# Patient Record
Sex: Female | Born: 1940 | Race: Black or African American | Hispanic: No | Smoking: Former smoker
Health system: Southern US, Community
[De-identification: ages and names within clinical notes are randomized; demographics above are authoritative.]

## PROBLEM LIST (undated history)

## (undated) DIAGNOSIS — A048 Other specified bacterial intestinal infections: Secondary | ICD-10-CM

## (undated) DIAGNOSIS — R600 Localized edema: Secondary | ICD-10-CM

## (undated) DIAGNOSIS — I10 Essential (primary) hypertension: Secondary | ICD-10-CM

## (undated) DIAGNOSIS — I639 Cerebral infarction, unspecified: Secondary | ICD-10-CM

## (undated) DIAGNOSIS — F0390 Unspecified dementia without behavioral disturbance: Secondary | ICD-10-CM

## (undated) HISTORY — PX: ROTATOR CUFF REPAIR: SHX139

---

## 2005-04-14 ENCOUNTER — Ambulatory Visit: Payer: Self-pay | Admitting: General Practice

## 2006-05-21 ENCOUNTER — Ambulatory Visit: Payer: Self-pay | Admitting: General Practice

## 2007-06-30 ENCOUNTER — Ambulatory Visit: Payer: Self-pay | Admitting: Family Medicine

## 2007-12-23 ENCOUNTER — Inpatient Hospital Stay: Payer: Self-pay | Admitting: Specialist

## 2008-07-06 ENCOUNTER — Ambulatory Visit: Payer: Self-pay | Admitting: Family Medicine

## 2008-12-28 ENCOUNTER — Ambulatory Visit: Payer: Self-pay | Admitting: Family Medicine

## 2009-01-04 ENCOUNTER — Encounter: Payer: Self-pay | Admitting: Family

## 2009-02-01 ENCOUNTER — Encounter: Payer: Self-pay | Admitting: Family

## 2009-03-03 ENCOUNTER — Encounter: Payer: Self-pay | Admitting: Family

## 2009-04-03 ENCOUNTER — Encounter: Payer: Self-pay | Admitting: Family

## 2009-05-03 ENCOUNTER — Encounter: Payer: Self-pay | Admitting: Family

## 2009-05-24 ENCOUNTER — Emergency Department: Payer: Self-pay | Admitting: Emergency Medicine

## 2009-06-07 ENCOUNTER — Ambulatory Visit: Payer: Self-pay | Admitting: Internal Medicine

## 2009-06-14 ENCOUNTER — Ambulatory Visit: Payer: Self-pay | Admitting: Internal Medicine

## 2009-06-18 ENCOUNTER — Ambulatory Visit: Payer: Self-pay | Admitting: Internal Medicine

## 2009-06-21 ENCOUNTER — Ambulatory Visit: Payer: Self-pay | Admitting: Internal Medicine

## 2009-06-28 ENCOUNTER — Ambulatory Visit: Payer: Self-pay | Admitting: Internal Medicine

## 2009-07-06 ENCOUNTER — Ambulatory Visit: Payer: Self-pay | Admitting: Internal Medicine

## 2009-07-13 ENCOUNTER — Ambulatory Visit: Payer: Self-pay | Admitting: Internal Medicine

## 2010-01-02 ENCOUNTER — Inpatient Hospital Stay: Payer: Self-pay | Admitting: Internal Medicine

## 2011-03-17 ENCOUNTER — Encounter: Payer: Self-pay | Admitting: Nurse Practitioner

## 2011-08-24 ENCOUNTER — Inpatient Hospital Stay: Payer: Self-pay | Admitting: Internal Medicine

## 2011-08-25 DIAGNOSIS — R748 Abnormal levels of other serum enzymes: Secondary | ICD-10-CM

## 2011-08-25 DIAGNOSIS — R9431 Abnormal electrocardiogram [ECG] [EKG]: Secondary | ICD-10-CM

## 2012-01-06 ENCOUNTER — Ambulatory Visit: Payer: Self-pay | Admitting: Family Medicine

## 2012-08-11 LAB — URINALYSIS, COMPLETE
Bilirubin,UR: NEGATIVE
Glucose,UR: NEGATIVE mg/dL (ref 0–75)
Nitrite: NEGATIVE
Protein: 30
RBC,UR: 1 /HPF (ref 0–5)
Squamous Epithelial: 2
WBC UR: 7 /HPF (ref 0–5)

## 2012-08-11 LAB — COMPREHENSIVE METABOLIC PANEL
Albumin: 3.9 g/dL (ref 3.4–5.0)
Anion Gap: 7 (ref 7–16)
BUN: 13 mg/dL (ref 7–18)
Calcium, Total: 9.6 mg/dL (ref 8.5–10.1)
Co2: 28 mmol/L (ref 21–32)
EGFR (Non-African Amer.): 51 — ABNORMAL LOW
Glucose: 121 mg/dL — ABNORMAL HIGH (ref 65–99)
Osmolality: 290 (ref 275–301)
Potassium: 3.9 mmol/L (ref 3.5–5.1)
Sodium: 145 mmol/L (ref 136–145)

## 2012-08-11 LAB — CBC
HCT: 37.2 % (ref 35.0–47.0)
MCH: 30 pg (ref 26.0–34.0)
MCV: 87 fL (ref 80–100)
RBC: 4.27 10*6/uL (ref 3.80–5.20)
RDW: 13.8 % (ref 11.5–14.5)
WBC: 8.4 10*3/uL (ref 3.6–11.0)

## 2012-08-12 ENCOUNTER — Observation Stay: Payer: Self-pay | Admitting: Internal Medicine

## 2012-08-12 LAB — CK TOTAL AND CKMB (NOT AT ARMC)
CK, Total: 62 U/L (ref 21–215)
CK-MB: 0.7 ng/mL (ref 0.5–3.6)

## 2012-08-12 LAB — TROPONIN I
Troponin-I: 0.19 ng/mL — ABNORMAL HIGH
Troponin-I: 0.17 ng/mL — ABNORMAL HIGH

## 2012-08-13 DIAGNOSIS — R079 Chest pain, unspecified: Secondary | ICD-10-CM

## 2012-08-13 LAB — URINE CULTURE

## 2012-08-13 LAB — LIPID PANEL
HDL Cholesterol: 45 mg/dL (ref 40–60)
Ldl Cholesterol, Calc: 62 mg/dL (ref 0–100)

## 2013-12-01 LAB — COMPREHENSIVE METABOLIC PANEL
AST: 35 U/L (ref 15–37)
Albumin: 3.8 g/dL (ref 3.4–5.0)
Alkaline Phosphatase: 74 U/L
Anion Gap: 5 — ABNORMAL LOW (ref 7–16)
BUN: 14 mg/dL (ref 7–18)
Bilirubin,Total: 0.7 mg/dL (ref 0.2–1.0)
CALCIUM: 9.5 mg/dL (ref 8.5–10.1)
CHLORIDE: 105 mmol/L (ref 98–107)
CREATININE: 1.27 mg/dL (ref 0.60–1.30)
Co2: 27 mmol/L (ref 21–32)
EGFR (African American): 50 — ABNORMAL LOW
GFR CALC NON AF AMER: 43 — AB
Glucose: 112 mg/dL — ABNORMAL HIGH (ref 65–99)
OSMOLALITY: 275 (ref 275–301)
Potassium: 3.9 mmol/L (ref 3.5–5.1)
SGPT (ALT): 16 U/L (ref 12–78)
Sodium: 137 mmol/L (ref 136–145)
Total Protein: 8.8 g/dL — ABNORMAL HIGH (ref 6.4–8.2)

## 2013-12-01 LAB — URINALYSIS, COMPLETE
BACTERIA: NONE SEEN
Bilirubin,UR: NEGATIVE
Blood: NEGATIVE
GLUCOSE, UR: NEGATIVE mg/dL (ref 0–75)
KETONE: NEGATIVE
LEUKOCYTE ESTERASE: NEGATIVE
Nitrite: NEGATIVE
Ph: 5 (ref 4.5–8.0)
Protein: NEGATIVE
RBC,UR: NONE SEEN /HPF (ref 0–5)
Specific Gravity: 1.012 (ref 1.003–1.030)
Squamous Epithelial: 2
WBC UR: 1 /HPF (ref 0–5)

## 2013-12-01 LAB — CBC
HCT: 37.2 % (ref 35.0–47.0)
HGB: 12.5 g/dL (ref 12.0–16.0)
MCH: 29.7 pg (ref 26.0–34.0)
MCHC: 33.7 g/dL (ref 32.0–36.0)
MCV: 88 fL (ref 80–100)
PLATELETS: 150 10*3/uL (ref 150–440)
RBC: 4.22 10*6/uL (ref 3.80–5.20)
RDW: 13.9 % (ref 11.5–14.5)
WBC: 14.2 10*3/uL — ABNORMAL HIGH (ref 3.6–11.0)

## 2013-12-01 LAB — CK: CK, TOTAL: 114 U/L (ref 21–215)

## 2013-12-01 LAB — RAPID INFLUENZA A&B ANTIGENS (ARMC ONLY)

## 2013-12-02 ENCOUNTER — Inpatient Hospital Stay: Payer: Self-pay | Admitting: Internal Medicine

## 2013-12-03 ENCOUNTER — Ambulatory Visit: Payer: Self-pay | Admitting: Neurology

## 2013-12-05 LAB — PLATELET COUNT: PLATELETS: 187 10*3/uL (ref 150–440)

## 2014-11-17 ENCOUNTER — Emergency Department: Payer: Self-pay | Admitting: Emergency Medicine

## 2015-01-20 ENCOUNTER — Emergency Department: Payer: Self-pay | Admitting: Emergency Medicine

## 2015-02-20 NOTE — H&P (Signed)
PATIENT NAME:  Laura Levy, ASHRAF MR#:  [id number] DATE OF BIRTH:  09-03-1941  DATE OF ADMISSION:  08/12/2012  PRIMARY CARE PHYSICIAN: Dr. Dorothey Baseman    REFERRING PHYSICIAN: Dr. Bayard Males    CHIEF COMPLAINT: Generalized weakness, decreased appetite, unable to walk from weakness and vomiting.   HISTORY OF PRESENT ILLNESS: Laura Levy is a 74 year old African American female with history of systemic hypertension, history of stroke with right hemiparesis. She was in her usual state of health until lately. Her son reports that she is having generalized weakness, unable to walk. She had vomited yesterday. Her appetite is down and she is becoming weak to the extent that she is unable to dress herself. The son also noticed that she has short-term memory and she is forgetting. The Emergency Room physician told me that she has elevated troponin and she has new weakness on the other side of her stroke, however, the patient denies that. Looking at her troponin, troponin is the same as before, even lower than her baseline. For instance, a year ago her troponin was 0.47, 0.5, 0.4, now it is 0.16. The patient denies having any chest pain. No shortness of breath. She had vomited only once.   REVIEW OF SYSTEMS: CONSTITUTIONAL: Denies any fever. No chills but has generalized fatigue. EYES: No blurring of vision. No double vision. ENT: No hearing impairment. No sore throat. No dysphagia. CARDIOVASCULAR: No chest pain. No shortness of breath. No syncope. RESPIRATORY: No cough. No shortness of breath. No chest pain. GASTROINTESTINAL: No abdominal pain. Reported only one episode of vomiting. No diarrhea. GENITOURINARY: No dysuria. No frequency of urination. MUSCULOSKELETAL: No joint pain or swelling. No muscular pain or swelling. INTEGUMENTARY: No skin rash. No ulcers. NEUROLOGY: No focal weakness other than residual right hemiparesis. This is unchanged. There is no new focal weakness more than her baseline other than  generalized weakness and unable to function. No seizure activity. No headache. PSYCHIATRY: No anxiety. No depression. ENDOCRINE: No polyuria or polydipsia. No heat or cold intolerance.   PAST MEDICAL HISTORY:  1. Systemic hypertension.  2. History of stroke with resultant right hemiparesis. Her stroke was in 1988 and she had another one in 2011.  3. Hyperlipidemia. 4. Ex chronic smoker.   PAST SURGICAL HISTORY: Right rotator cuff injury repair.   SOCIAL HABITS: Nonsmoker. She is a former smoker. No history of alcohol or drug abuse.   SOCIAL HISTORY: She is a widow. Lives at home alone. She has a nurse aide who comes to visit her and also her daughter keeps an eye on her.   FAMILY HISTORY: She has no information about her father. She reports that her mother suffered from congestive heart failure before her death.   ADMISSION MEDICATIONS:  1. Aspirin 81 mg a day. 2. Plavix 75 mg a day. 3. Norvasc 10 mg a day.  4. Atorvastatin 80 mg a day, although the son feels probably this is one of the medications that was changed to a different brand. They did not bring her medication with them.   ALLERGIES: No known drug allergies.   PHYSICAL EXAMINATION:   VITAL SIGNS: Blood pressure 151/78, respiratory rate 18, pulse 88, temperature 98.4, pulse oximetry 98%.   GENERAL APPEARANCE: Elderly female laying in bed in no acute distress.   HEAD: No pallor. No icterus. No cyanosis.   EARS, NOSE, AND THROAT: Hearing was normal. Nasal mucosa, lips, tongue were normal.   EYES: Normal eyes and conjunctivae. Pupils about 4 mm, equal, sluggishly  reactive to light.   NECK: Supple. Trachea at midline. No thyromegaly. No cervical lymphadenopathy. No masses.   HEART: Normal S1, S2. No S3, S4. No murmur. No gallop. No carotid bruits.   RESPIRATORY: Normal breathing pattern without use of accessory muscles. No rales. No wheezing.   ABDOMEN: Soft without tenderness. No hepatosplenomegaly. No masses. No  hernias.   SKIN: No ulcers. No subcutaneous nodules.   MUSCULOSKELETAL: No joint swelling. No clubbing.   NEUROLOGIC: Cranial nerves II through XII are intact. There is a subtle right facial weakness barely noticeable. She has right hemiparesis with contracture of her right hand. Plantar responses are downward. She is able to move both lower extremities above the examining bed table level. She has more pronounced weakness in the right arm. These are old findings and nothing new. Speech and swallowing were normal.   PSYCHIATRIC: The patient is alert, oriented to place and people. She thinks it is [id number] but she meant 2013. She knows it is a hospital but did not remember the name of it, however, she knows the name of the town. The date she stated is September but it is October.   LABORATORY, DIAGNOSTIC, AND RADIOLOGICAL DATA: Chest x-ray showed heart size is upper normal. No consolidation. No effusion.   EKG showed normal sinus rhythm at rate of 80 per minute. T wave inversion in leads V1 and V2. This is unchanged compared to her EKG a year ago.   Serum glucose 121, BUN 13, creatinine 1.1, sodium 141, potassium 3.9. Her liver function tests were normal. Troponin was elevated at 0.16, however, her baseline a year ago was 0.47, 0.5, 0.45. CBC showed white count 8000, hemoglobin 12, hematocrit 37, platelet count 162. Urinalysis showed cloudy urine with 7 white blood cells.   ASSESSMENT:  1. Generalized weakness and failure to thrive. 2. Vomiting. This appears to be self-limited.  3. Elevated troponin, however, this is less than her baseline a year ago when she was admitted. At that time she was evaluated by Dr. Mariah MillingGollan and he indicated that she has no symptoms and he felt to continue medical management. Her EKG has subtle abnormalities in T wave, V1 and V2 but this is unchanged compared to her last EKG done a year ago. 4. Urinary tract infection.  5. Systemic hypertension.  6. Old stroke with resultant  right hemiparesis. She also has history of cerebellar infarct as well.  7. Hyperlipidemia.   PLAN:  1. Will admit the patient to the medical floor on telemetry.  2. I will continue follow-up on her troponin, although this could be a baseline for her. I do not know why it is elevated as she is asymptomatic cardiac-wise.  3. Gentle IV hydration.  4. Zofran p.r.n. for nausea and vomiting. I will also add PPI. It may help if she has an element of gastritis.  5. I will send urine for culture and treated her for urinary tract infection with Rocephin, although the urinary tract infection does not seem to be significant.  6. I will consult physical therapy. 7. Regarding the troponin, will check it q.8 hours x3. If there is a trend towards more elevation, then to consider echocardiogram and Cardiology consultation. Again, the level appears below her baseline a year ago.   The patient and her son indicates that she has no LIVING WILL. Her CODE STATUS is FULL CODE.   TIME SPENT: Time Spent evaluating this patient including reviewing her medical records took more than one hour.  ____________________________ Carney Corners. Rudene Re, MD amd:drc D: 08/12/2012 01:15:50 ET T: 08/12/2012 06:17:10 ET JOB#: [id number]  cc: Carney Corners. Rudene Re, MD, <Dictator> Teena Irani. Terance Hart, MD Zollie Scale MD ELECTRONICALLY SIGNED 08/13/2012 22:33

## 2015-02-20 NOTE — Discharge Summary (Signed)
PATIENT NAME:  Laura Levy, Laura M MR#:  [id number] DATE OF BIRTH:  Feb 06, 1941  DATE OF ADMISSION:  08/12/2012 DATE OF DISCHARGE:  08/13/2012  ADMITTING DIAGNOSIS: Generalized weakness and failure to thrive.   DISCHARGE DIAGNOSES: 1. Generalized weakness, dehydration.  2. Episode of nausea, vomiting.  3. Elevated troponin but no acute coronary syndrome or cardiac injury.  4. History of hypertension.  5. Hyperlipidemia with low density lipoprotein of 62. 6. History of transient ischemic attack, cerebrovascular accident with right hemiplegia.   DISCHARGE CONDITION: Stable.   DISCHARGE MEDICATIONS: Patient is to resume her outpatient medications which are:  1. Aspirin 81 mg p.o. daily. 2. Plavix 75 mg p.o. daily.  3. Atorvastatin 80 mg p.o. at bedtime.  4. Amlodipine 10 mg p.o. daily. 5. New medication pantoprazole 40 mg p.o. daily.   NOTE: Home health will be prescribed for her upon discharge. Physical therapy as well as nurse and nurse aide.   HOME OXYGEN: None.   DIET: 2 grams salt, low fat, low cholesterol, regular consistency.   FOLLOW UP: Follow-up appointment with Dr. Terance HartBronstein in two days after discharge.  CONSULTANT: Care management.   HISTORY OF PRESENT ILLNESS: Patient is a 74 year old Caucasian female with past medical history significant for history of stroke who presented to the hospital on 08/12/2012 with generalized weakness, decreased appetite as well as inability to walk, episodes of nausea, vomiting at home. Please refer to Dr. Riley Nearingarwish's admission note on 08/12/2012. On arrival to the Emergency Room patient's blood pressure was 151/78, saturation was 98%, temperature was 98.4, pulse was 88, respiration 18. Physical examination was unremarkable.   LABORATORY, DIAGNOSTIC AND RADIOLOGICAL DATA: Chest x-ray one view 10/09/013 showed no acute cardiopulmonary disease. CT scan of head without contrast 08/11/2012 due to weakness revealed no acute intracranial process. Patient did  have chronic small vessel ischemic disease with multifocal areas of encephalomalacia from prior infarct.   Patient's EKG showed normal sinus rhythm at 80 beats per minute, T wave inversions in leads V1, V2, however, it did not show any change from prior EKG. Patient's lab data on 08/11/2012 showed glucose 121, otherwise BMP was unremarkable. Patient's liver enzymes revealed elevated total protein of 8.7, otherwise unremarkable. Patient's first troponin was mildly elevated at 0.16, second 0.19 and third 0.17, however, patient's MB fraction or CK total were normal. CBC within normal limits. Patient's urinalysis revealed yellow cloudy urine, negative for glucose, bilirubin or ketones, specific gravity 1.024, pH 5.0, negative for blood, 30 mg/dL protein, negative for nitrites, trace leukocyte esterase, 1 red blood cell, 7 white blood cells, trace bacteria, 2 epithelial cells and mucous was present. Patient's urine cultures, however, did not show any growth. Patient's chest x-ray was also unremarkable.   HOSPITAL COURSE: Initially patient was admitted to the hospital because of weakness and that weakness was thought to be possibly due to urinary tract infection, however, as urine culture came back negative her weakness was felt to be due to dehydration and episodes of nausea, vomiting. Actually patient had a few more episodes of nausea and vomiting while in the hospital. She was started on p.o. Protonix and was introduced to clear liquid diet initially which was slowly advanced. With this patient's condition improved. She was able to eat by the day of discharge. She was evaluated by physical therapy who recommended physical therapy at home.   In regards to her cardiac enzymes, it was felt that patient's cardiac enzyme elevation is not acute coronary syndrome or cardiac injury whatsoever. It appears that  patient had mild cardiac enzyme elevation in October 2012 admission. At that time she was evaluated and consulted  by cardiologist, Dr. Mariah Milling. Dr. Mariah Milling, however, did not feel that patient needs any further studies while in the hospital, however, he recommended to follow up with him and consider outpatient LexiScan Myoview if any new symptoms arise. Patient had no chest pains while in the hospital or prior to coming to the hospital. He also recommended to continue statin given patient's recent CVAs. As patient was already on statin during this admission, patient's lipid panel was checked while she was in the hospital and LDL was found to be 62, total cholesterol was found to be 116, triglycerides were 47 and HDL was 45. It was felt that patient's lipid panel was well controlled so no other recommendations were made, however, patient is being discharged to home with home health and recommendation of possibly follow up with Dr. Mariah Milling as outpatient. In the past patient was also evaluated by neurologist, Dr. Sherryll Burger, and recommended to continue the same therapy. We did not repeat patient's echocardiogram as in the past patient's left ventricular function was found to be normal, ejection fraction of 50%, moderate to severe mitral regurgitation as well as mild to moderate tricuspid regurgitation were only noted. A carotid ultrasound was unremarkable in March 2011. Dr. Sherryll Burger at that time did not recommend any anticoagulation, however, recommended to continue aspirin therapy as well as Plavix. Patient's oral intake improved. She was able to tolerate regular diet and it was felt that she was stable to be discharged home. Patient's son, however, had any significant concerns about patient being discharged home, however, he did not verbalize exactly what concerns he had but he was extremely unhappy with patient being discharged to home due to unclear reasons. I tried to talk to patient's son, however, he did not explain his concerns and just droped telephone receiver and was not willing to discuss much more. Nursing staff also discussed with  patient's son his concerns, however, they were just shouted at. It was felt that patient would benefit from home health which will be prescribed for her upon discharge. Patient is being discharged in stable condition with above-mentioned medications and follow up. Her vital signs on day of discharge: Temperature 97.5, pulse 65, respiration rate 18, blood pressure 144/80, saturation 95% to 96% on room air at rest.   TIME SPENT: 40 minutes.   ____________________________ Katharina Caper, MD rv:cms D: 08/16/2012 14:19:14 ET T: 08/17/2012 09:29:00 ET JOB#: [id number]  cc: Katharina Caper, MD, <Dictator> Teena Irani. Terance Hart, MD  Katharina Caper MD ELECTRONICALLY SIGNED 09/04/2012 7:58

## 2015-02-24 NOTE — Discharge Summary (Signed)
PATIENT NAME:  Laura Levy, Laura Levy MR#:  [id number] DATE OF BIRTH:  1941-04-07  DATE OF ADMISSION:  12/02/2013 DATE OF DISCHARGE:  12/05/2013  ADMITTING PHYSICIAN: Fritzi Mandes, MD  DISCHARGING PHYSICIAN: Gladstone Lighter, MD  PRIMARY CARE PHYSICIAN: Juluis Pitch, MD  [place]:  1.  Neurology with Dr. Gurney Maxin.  2.  Orthopedic with Dr. Marry Guan.   DISCHARGE DIAGNOSES: 1.  Acute on chronic right-sided weakness with MRI showing an acute infarct in the right external capsule.  2.  History of cerebrovascular accident with prior right-sided weakness.  3.  Hypertension.  4.  Hyperlipidemia.   DISCHARGE HOME MEDICATIONS:  1.  Aspirin 81 mg p.o. daily.  2.  Plavix 75 mg p.o. daily.  3.  Atorvastatin 80 mg p.o. at bedtime.  4.  Amlodipine 10 mg p.o. daily.  5.  MiraLax p.r.n. for constipation.  6.  Senokot 1 tablet p.o. b.i.d. p.r.n. for constipation.   DISCHARGE DIET: Low-sodium.  DISCHARGE ACTIVITY: As tolerated.   FOLLOWUP INSTRUCTIONS: 1.  Physical therapy.  2.  PCP followup in 1 to 2 weeks.   LABS AND IMAGING STUDIES: Prior to discharge: Urinalysis negative for any infection.   WBC 14.2, hemoglobin 12.5, hematocrit 37.2, platelet count 115.   Sodium 137, potassium 3.9, chloride 105, bicarb 27, BUN 14, creatinine 1.27, glucose 112, and calcium of 9.5.   ALT 16, AST 35, alk phos 94, total bili 0.7, and albumin of 3.8.   Right hip x-ray showing normal exam. No dislocation or fracture.   Ultrasound of lower extremity, of the right leg, showing no evidence of any DVT.   Chest x-ray showing bilateral pulmonary hypoinflation. No evidence of pneumonia or edema.   Influenza test is negative.   CT of the head without contrast showing atrophy and extensive chronic ischemic changes. No acute infarct noted.   MRI of the brain without contrast showing a small area of acute infarct in the right superior external capsule. Advanced chronic ischemic changes noted  and also atrophy.   BRIEF HOSPITAL COURSE: Laura Levy is a 74 year old African American female with past medical history significant for history of CVA with residual right-sided weakness, hypertension, and hyperlipidemia who presents to the hospital from home secondary to right-sided leg pain and also difficulty walking.  1.  Acute on chronic right-sided weakness. The patient with known history of CVA and right-sided weakness who was ambulating at home with the help of a walker who presented to the hospital secondary to significant pain along the leg and unable to even ambulate. She was initially admitted under observation. The patient worked with physical therapy who recommended that she might benefit from going to rehab. No other focal neurological deficits were noted. She was evaluated by neurology who recommended a MRI. MRI did show another acute infarct in the right-sided external capsule posteriorly. However, not sure if that has to do with any contribution to her right-sided weakness. Her weakness has improved and she is back to her baseline. She has 4/5 strength in her right lower extremity and 3 to 4/5 strength in her right upper extremity with loss of right hand grip. She is already on dual antiplatelet treatment with aspirin and Plavix, and she is also on statin which will be continued at this time. 2.  Hypertension. The patient is on Norvasc, which will be continued.  3.  Constipation. She was extensively constipated in the hospital requiring Senokot, MiraLax and p.r.n. enema.  Her course has been otherwise uneventful in  the hospital.   DISCHARGE CONDITION: Stable.   DISCHARGE DISPOSITION: Short-term rehab.   TIME SPENT ON DISCHARGE: 40 minutes.  ____________________________ Gladstone Lighter, MD rk:sb D: 12/05/2013 12:27:18 ET T: 12/05/2013 13:14:19 ET JOB#: [id number]  cc: Gladstone Lighter, MD, <Dictator> Youlanda Roys. Lovie Macadamia, MD Gladstone Lighter MD ELECTRONICALLY SIGNED 12/08/2013  14:54

## 2015-02-24 NOTE — Consult Note (Signed)
Referring Physician:  Ilda Basset :   Primary Care Physician:  Woodfin Ganja : [facility], [address] [place], [place] [address], [phone]  Reason for Consult: Admit Date: 01-Dec-2013  Chief Complaint: weakness  Reason for Consult: confusion; weakness   History of Present Illness: History of Present Illness:   74 year old woman with a history of stroke and related symptoms of RUE moreso than RLE weakness.  Normally she can walk with a cane.  Her sister in the room says yesterday patient was very weak.  Having trouble walking.  She had complained of some right hip pain.  No falls or recent injuries that sister can recall.  In addition, patient has seemed confused to her sister.  She says patient is normally fully oriented, but today has had some confusion, not able to answer questions quickly and accurately like normal.  Patinet was seen in the ED and found to have normal vitals.  Also with chronic dysphagia as a result of prior stroke.  Symptoms persistent.  Moderate to severe per sister.   stroke.  History:  tobacco or alcohol. History:  FH of stroke or other known neurologic disease. Lipitor, ASA 81, Amlodipine       ROS:  General weakness   HEENT no complaints   Lungs no complaints   Cardiac no complaints   GI no complaints   GU no complaints   Musculoskeletal no complaints   Extremities no complaints   Skin no complaints   Neuro confusion   Endocrine no complaints   Psych no complaints   Past Medical/Surgical Hx:  dysphagia: h/o a dysphagia diet per pt.  CVA:   Hypercholesterolemia:   HTN:   none:   Home Medications: Medication Instructions Last Modified Date/Time  amlodipine 10 mg oral tablet 1 tab(s) orally once a day x 30 days 29-Jan-15 05:35  aspirin 81 mg oral tablet 1  orally once a day  29-Jan-15 05:35  Plavix 75 mg oral tablet 1  orally once a day  29-Jan-15 05:35  atorvastatin 80 mg oral tablet 1 tab(s) orally once a day (at  bedtime) 29-Jan-15 05:35   [facility] Current Meds:  HePARin injection, 5000 unit(s), Subcutaneous, q8h  Indication: Anticoagulant, Monitor Anticoags per hospital protocol  Sodium Chloride 0.9% injection, 3 ml, IV push, q6h  Acetaminophen * tablet, ( Tylenol (325 mg) tablet)  650 mg Oral q4h PRN for pain or temp. greater than 100.4  - Indication: Pain/Fever  Docusate Sodium capsule, ( Colace)  100 mg Oral bid PRN for constipation  - Indication: Stool Softener  Senna tablet, ( Senokot)  1 tablet(s) Oral bid PRN for constipation  - Indication: Stool Softner/ Constipation/ Bowel Prep for Surgery  Instructions:  1 tablet = 8.6 mg  MorphINE  injection, 1 to 2 mg, IV push, q6h PRN for pain  Indication: Pain, [Med Admin Window: 30 mins before or after scheduled dose]  Ondansetron injection, ( Zofran injection )  4 mg, IV push, q4h PRN for Nausea/Vomiting  Indication: Nausea/ Vomiting  amLODIPine tablet, ( Norvasc)  10 mg Oral daily  - Indication: Hypertension/ Angina  Aspirin Chewable, 81 mg Oral daily  - Indication: Pain/Fever/Thromboembolic Disorders/Post MI/Prophylaxis MI  atorvaSTATin tablet, 80 mg Oral at bedtime  - Indication: Hypercholesterolemia  Clopidogrel tablet, 75 mg Oral daily  Instructions:  Initiate Bleeding Precautions Protocol  Nursing Saline Flush, 3 to 6 ml, IV push, Q1M PRN for IV Maintenance  Pneumococcal 23-valent Vaccine, 0.5 ml, Intramuscular,  once  Indication: Pneumococcal Immunization, 0.45m IM once (Stored in Pyxis Refrigerator)  Influenza Virus Quadrivalent Vaccine injection, 0.5 ml, Intramuscular, GivenOnce  Indication: provide Active Immunity to Influenza Strains contained in Vaccine, ***The patient must have a temperature of 100.5 or less, anything greater the patient needs to be afebrile x 24 hours before administration***, **Latex Free**  Allergies:  No Known Allergies:   Vital Signs: **Vital Signs.:   30-Jan-15 15:20  Vital Signs Type Routine   Temperature Temperature (F) 98.2  Celsius 36.7  Temperature Source oral  Pulse Pulse 88  Respirations Respirations 18  Systolic BP Systolic BP 1099 Diastolic BP (mmHg) Diastolic BP (mmHg) 66  Mean BP 80  Pulse Ox % Pulse Ox % 92  Pulse Ox Activity Level  At rest  Oxygen Delivery Room Air/ 21 %    21:23  Temperature Source oral   EXAM: GENERAL: Pleasant and in NAD.  Confused.  Slow to follow commands.  Normocephalic and atraumatic.  EYES: Funduscopic exam shows normal disc size, appearance and C/D ratio without clear evidence of papilledema.  CARDIOVASCULAR: S1 and S2 sounds are within normal limits, without murmurs, gallops, or rubs.  MUSCULOSKELETAL: Bulk - Normal Tone - Normal Pronator Drift - Absent bilaterally. Ambulation - Unable to ambulate without assist, so deferred ambulation testing today.  R/L 4/5    Shoulder abduction (deltoid/supraspinatus, axillary/suprascapular n, C5) 4/5    Elbow flexion (biceps brachii, musculoskeletal n, C5-6) 4/5    Elbow extension (triceps, radial n, C7) 4/5    Finger adduction (interossei, ulnar n, T1)   4+/5    Hip flexion (iliopsoas, L1/L2) 4+/5    Knee flexion (hamstrings, sciatic n, L5/S1) 4+/5    Knee extension (quadriceps, femoral n, L3/4) 4+/5    Ankle dorsiflexion (tibialis anterior, deep fibular n, L4/5) 4+/5    Ankle plantarflexion (gastroc, tibial n, S1)  NEUROLOGICAL: MENTAL STATUS: Patient is oriented x 2.  Recent and remote memory are moderately impaired.  Attention span and concentration is reduced.  Naming, repetition, comprehension and expressive speech are within normal limits.  Patient's fund of knowledge is reduced.  CRANIAL NERVES: Normal    CN II (normal visual acuity and visual fields) Normal    CN III, IV, VI (extraocular muscles are intact) Normal    CN V (facial sensation is intact bilaterally) Normal    CN VII (facial strength is intact bilaterally) Normal    CN VIII (hearing is intact  bilaterally) Normal    CN IX/X (palate elevates midline, normal phonation) Normal    CN XI (shoulder shrug strength is normal and symmetric) Normal    CN XII (tongue protrudes midline)   SENSATION: Patchy sensory loss is noted in the RUE and RLEs.  Otherwise sensation is wnl.   REFLEXES: R/L 2+/2+    Biceps 2+/2+    Brachioradialis   2+/2+    Patellar 2+/2+    Achilles   COORDINATION/CEREBELLAR: Finger to nose testing is within normal limits in the LUE.  Lab Results: LabObservation:  29-Jan-15 07:19   OBSERVATION Reason for Test Pain, eval for DVT  Hepatic:  29-Jan-15 05:22   Bilirubin, Total 0.7  Alkaline Phosphatase 74 (45-117 NOTE: New Reference Range 09/23/13)  SGPT (ALT) 16  SGOT (AST) 35  Total Protein, Serum  8.8  Albumin, Serum 3.8  Routine Micro:  29-Jan-15 08:10   Micro Text Report INFLUENZA A+B ANTIGENS   COMMENT  NEGATIVE FOR INFLUENZA A (ANTIGEN ABSENT)   COMMENT                   NEGATIVE FOR INFLUENZA B (ANTIGEN ABSENT)   ANTIBIOTIC                       Comment 1.. NEGATIVE FOR INFLUENZA A (ANTIGEN ABSENT) A negative result does not exclude influenza. Correlation with clinical impression is required.  Comment 2.. NEGATIVE FOR INFLUENZA B (ANTIGEN ABSENT)  Result(s) reported on 01 Dec 2013 at 09:00AM.  Routine Chem:  29-Jan-15 05:22   Glucose, Serum  112  BUN 14  Creatinine (comp) 1.27  Sodium, Serum 137  Potassium, Serum 3.9  Chloride, Serum 105  CO2, Serum 27  Calcium (Total), Serum 9.5  Osmolality (calc) 275  eGFR (African American)  50  eGFR (Non-African American)  43 (eGFR values <36m/min/1.73 m2 may be an indication of chronic kidney disease (CKD). Calculated eGFR is useful in patients with stable renal function. The eGFR calculation will not be reliable in acutely ill patients when serum creatinine is changing rapidly. It is not useful in  patients on dialysis. The eGFR calculation may not be applicable to patients  at the low and high extremes of body sizes, pregnant women, and vegetarians.)  Anion Gap  5  Cardiac:  29-Jan-15 05:22   CK, Total 114 (Result(s) reported on 01 Dec 2013 at 07:02AM.)  Routine UA:  29-Jan-15 05:22   Color (UA) Yellow  Clarity (UA) Clear  Glucose (UA) Negative  Bilirubin (UA) Negative  Ketones (UA) Negative  Specific Gravity (UA) 1.012  Blood (UA) Negative  pH (UA) 5.0  Protein (UA) Negative  Nitrite (UA) Negative  Leukocyte Esterase (UA) Negative (Result(s) reported on 01 Dec 2013 at 05:45AM.)  RBC (UA) NONE SEEN  WBC (UA) 1 /HPF  Bacteria (UA) NONE SEEN  Epithelial Cells (UA) 2 /HPF (Result(s) reported on 01 Dec 2013 at 05:45AM.)  Routine Hem:  29-Jan-15 05:22   WBC (CBC)  14.2  RBC (CBC) 4.22  Hemoglobin (CBC) 12.5  Hematocrit (CBC) 37.2  Platelet Count (CBC) 150 (Result(s) reported on 01 Dec 2013 at 05:41AM.)  MCV 88  MCH 29.7  MCHC 33.7  RDW 13.9   Radiology Results: CT:    30-Jan-15 14:37, CT Head Without Contrast  CT Head Without Contrast   REASON FOR EXAM:    increasing weakness right LE  COMMENTS:       PROCEDURE: CT  - CT HEAD WITHOUT CONTRAST  - Dec 02 2013  2:37PM     CLINICAL DATA:  Increasing weakness right leg    EXAM:  CT HEAD WITHOUT CONTRAST    TECHNIQUE:  Contiguous axial images were obtained from the base of the skull  through the vertex without intravenous contrast.    COMPARISON:  MRI 08/25/2011  FINDINGS:  Generalized atrophy. Extensive chronic ischemic change. Chronic  infarcts in the frontal lobes bilaterally. Chronic infarct in the  left caudate. Chronic infarcts in the cerebellum bilaterally.  Chronic right occipital infarct.    Negative for acute infarct. Negative for hemorrhage or mass. No  shift of the midline structures.    Mucosal thickening in the sphenoid sinus.     IMPRESSION:  Atrophy and extensive chronic ischemic change. No acute infarct. MRI  is more sensitive for acute infarct than  CT.  Electronically Signed    By: CFranchot GalloM.D.    On: 12/02/2013 14:52  Verified By: Truett Perna, M.D.,   Impression/Recommendations: Recommendations:   74 year old woman with a history of stroke and related symptoms of RUE moreso than RLE weakness. with confusion and abrupt onset difficulty with ambulation and subjective weakness.  Given the chronic RUE and RLE weakness, it is uncertain whether or not there is new weakness.  No clear apraxia on exam.  History of multiple infarcts in the past judging from her HCT.  Concern for acute CVA given abrupt onset nature of difficulty walking without musculoskeletal injury.  Recommend Brain MRI without contrast to eval for acute stroke.  Patient appears confused today and sister agrees with this assessment.  Is not febrile.  Labs unremarkable.  Would check B12, TSH and ammonia levels.  UA is unremarkable.  Rec she work with physical therapy while inpatient.   have reviewed the results of the most recent imaging studies, tests and labs as outlined above and answered all related questions.  have personally viewed the patient's HCT and it shows multiple, bilateral, old infarcts. and coordinated plan of care with hospitalist.   Electronic Signatures: Anabel Bene (MD)  (Signed 31-Jan-15 00:00)  Authored: REFERRING PHYSICIAN, Primary Care Physician, Consult, History of Present Illness, Review of Systems, PAST MEDICAL/SURGICAL HISTORY, HOME MEDICATIONS, Current Medications, ALLERGIES, NURSING VITAL SIGNS, Physical Exam-, LAB RESULTS, RADIOLOGY RESULTS, Recommendations   Last Updated: 31-Jan-15 00:00 by Anabel Bene (MD)

## 2015-02-24 NOTE — H&P (Signed)
PATIENT NAME:  Laura Levy, Laura Levy MR#:  [id number] DATE OF BIRTH:  07-19-1941  DATE OF ADMISSION:  12/01/2013  PRIMARY CARE PHYSICIAN: Dr. Terance Hart.   CHIEF COMPLAINT: Difficulty walking and feeling weak all over, unable to get around at home.   HISTORY OF PRESENT ILLNESS: Laura Levy is a pleasant 74 year old African American female with history of CVA, which has left her with symptoms of mild dysplasia and right upper and lower extremity weakness. She lives at home with her daughter and walks around using a cane. This morning she tried to wake up and tried to get out of bed and had difficulty getting her out of bed and felt  very weak. She denies any fall. She does have some right hip pain, which is chronic due to n EGD and denies any recent injury. In the Emergency Room, the patient was found to be hemodynamically stable. She was almost a 2 person assist in the ER and was not able to sit at the edge of the bed for very long. The patient is being admitted for further evaluation and management secondary her ambulation dysfunction from her late side effects of CVA in the past.   PAST MEDICAL HISTORY:  1.  Dysphagia.  2.  CVA.  3.  Hypercholesterolemia.  4.  Hypertension.    SOCIAL HISTORY: Lives at home with her daughter. She does have an aide that comes during the daytime to help for  a few hours. She is a nonsmoker, nonalcoholic.   MEDICATIONS:  1.  Plavix 75 mg daily.  2.  Atorvastatin 80 mg daily.  3.  Aspirin 81 mg daily.  4.  Amlodipine 10 mg daily.   ALLERGIES: No known drug allergies.   FAMILY HISTORY: She reports that her mother suffered from congestive heart failure.   REVIEW OF SYSTEMS: CONSTITUTIONAL: No fever. Positive for fatigue and weakness. EYES: No blurred or double vision. ENT: No tinnitus, hearing loss or ear pain. RESPIRATORY: No cough, wheeze, hemoptysis, or COPD. CARDIOVASCULAR: No chest pain, orthopnea or arrhythmia. Positive for hypertension. GASTROINTESTINAL: No nausea,  vomiting, diarrhea, abdominal pain. GENITOURINARY: No dysuria, hematuria, or frequency. ENDOCRINE: No polyuria, nocturia or thyroid problems. HEMATOLOGY: No anemia or easy bruising. SKIN: No acne or rash. MUSCULOSKELETAL: Positive for arthritis and possible right hip pain. No joint swelling. NEUROLOGIC: Positive for CVA, late side effects with right upper and lower extremity weakness. Mild dysarthria. PSYCHIATRIC: No anxiety or depression. All other systems reviewed are negative.   PHYSICAL EXAMINATION:  GENERAL: The patient is awake, alert, oriented x 3, not in acute distress.  VITAL SIGNS: She is afebrile, pulse is 106 blood pressure is 140/66 and sats are 99% on room air.  HEENT: Atraumatic, normocephalic. Pupils: PERRLA. EOM intact. Oral mucosa is moist.  NECK: Supple. No JVD. No carotid bruit.  LUNGS: Clear to auscultation bilaterally. No rales, rhonchi, respiratory distress or labored breathing.  CARDIOVASCULAR: Both the heart sounds are normal. Rate and rhythm is regular. PMI not lateralized. Chest is nontender.  EXTREMITIES: Good pedal pulses and good femoral pulses. No lower extremity edema.  ABDOMEN: Soft, benign, nontender. No organomegaly.  SKIN: Warm and dry.  NEUROLOGIC: Grossly intact, cranial nerves II through XII. Mild dysarthria. No facial droop. The patient has chronic left upper and lower extremity weakness/hemiparesis and some minimal contracture. Reflexes are 1+ in both upper and lower extremities. Sensory exam is within normal limits. Gait not tested.   Influenza A and B negative. Chest x-ray: Bilateral hypoinflation. No evidence of alveolar  pneumonia. Ultrasound lower extremity no evidence of DVT. Right hip shows no evidence of fracture or dislocation. UA negative for UTI. White count is 14.2, hemoglobin and hematocrit is 12.5 and 37.2. Comprehensive metabolic panel is within normal limits. CK total is 114.   ASSESSMENT AND PLAN: A 74 year old, Laura Levy, with history of CVA  in the past with right-sided hemiparesis and mild dysarthria and history of hypertension, who comes in with ambulation dysfunction this morning. She feels a little weaker than her usual self. She is being admitted with:  1.  Ambulation dysfunction with right hip pain. 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 Clinically, I do not think se is having any stroke. She is neurologically intact other than the late side effects of her CVA, which right-sided hemiparesis. The patient will be admitted on the medical floor. We will have physical therapy see patient to determine a safe discharge plan for the patient. Will have care management/social worker also see the patient.  2.  Hypertension. Continue amlodipine.  3.  History of CVA with hemiparesis. Continue aspirin and Plavix.  4.  Hyperlipidemia, on atorvastatin.  5.  Deep vein thrombosis prophylaxis with subcutaneous heparin.  6.  Further workup according to the patient's clinical course. Hospital admission plan was discussed with the patient and the patient's sister, who was present in the Emergency Room.   TIME SPENT: 40 minutes.   ____________________________ Wylie HailSona A. Allena KatzPatel, MD sap:aw D: 12/01/2013 11:36:46 ET T: 12/01/2013 11:50:32 ET JOB#: [id number]  cc: Allysia Ingles A. Allena KatzPatel, MD, <Dictator> Teena Iraniavid M. Terance HartBronstein, MD Willow OraSONA A Rae Sutcliffe MD ELECTRONICALLY SIGNED 12/01/2013 16:08

## 2015-02-24 NOTE — Consult Note (Signed)
Brief Consult Note: Diagnosis: Difficulty standing/walking; right hip pain resolved.   Patient was seen by consultant.   Consult note dictated.   Comments: No focal orthopaedic deficit to explain the difficulty with standing/ambulation. Consider Neurology consult.  Electronic Signatures: Donato HeinzHooten, James P (MD)  (Signed 30-Jan-15 14:51)  Authored: Brief Consult Note   Last Updated: 30-Jan-15 14:51 by Donato HeinzHooten, James P (MD)

## 2015-02-24 NOTE — Consult Note (Signed)
PATIENT NAME:  Laura Levy, WALDER MR#:  [id number] DATE OF BIRTH:  1941/03/09  DATE OF CONSULTATION:  12/02/2013  REFERRING PHYSICIAN:  Enedina Finner, MD CONSULTING PHYSICIAN:  Illene Labrador. Angie Fava., MD  CHIEF COMPLAINT: Right hip pain and difficulty walking.   HISTORY OF PRESENT ILLNESS: The patient is a 74 year old female who has a previous history of CVA who has been having some progressive short-term memory issues. On the day of admission she attempted to get out of bed and reported having significant difficulty due to "feeling very weak." There was no fall or trauma. She apparently presented to [facility] Emergency Room complaining of some right hip pain, although with further questioning she has had some chronic intermittent hip pain. Attempt at PT evaluation demonstrated need for 2 person assist with poor progression with gait.   PAST MEDICAL HISTORY: Dysphagia, CVA with residual right-sided weakness, upper extremity greater than lower extremity, hypercholesterolemia, and hypertension.   ADMISSION MEDICATIONS: Plavix 75 mg daily, atorvastatin 80 mg daily, aspirin 81 mg daily, and amlodipine 10 mg daily.   ALLERGIES: No known drug allergies.   SOCIAL HISTORY: The patient lives at home with her daughter. She denies any current tobacco or alcohol use.   FAMILY HISTORY: Congestive heart failure in her mother.   REVIEW OF SYSTEMS: The patient denies any fevers or chills. She does report generalized fatigue and weakness, which is not localized to the upper or lower extremities. Pertinent musculoskeletal exam is positive for some mild generalized arthritis. She denies any joint swelling. She denies any locking or giving way of the lower extremities.   PHYSICAL EXAMINATION: GENERAL: The patient is a pleasant well-developed, well-nourished female seen in no acute distress.  HEENT: Atraumatic, normocephalic. Sclerae clear. Extraocular motion is intact. Oropharynx is clear. Tongue is  midline.  NECK: Supple, nontender, with good range of motion.  EXTREMITIES: There is posturing noted to the right upper extremity consistent with the patient's previous history of stroke. Good range of motion of the left upper extremity.  LOWER EXTREMITIES: No gross tenderness to palpation about the greater trochanters, knees, or ankles. Some mild generalized muscle atrophy is noted. Good range of motion of the hips and knees. The patient is able to perform independent straight leg raise. No pain is elicited with range of motion of either hip. Knees are stable to varus and valgus stress. No gross effusion is appreciated. Flexion of greater than 100 degrees is noted bilaterally. Lachman test is negative bilaterally. ANKLES: Demonstrates good ankle dorsiflexion and plantar flexion. No gross instability. No gross pretibial or ankle edema.  NEUROLOGIC: The patient is awake, alert, and oriented. She is answering questions appropriately. However, she seems to have difficulty processing demands, especially with regards to range of motion or strength testing in the lower extremities.   Sensory function is intact to light touch in both upper and lower extremities. Motor strength is felt to be 5/5 in the hip flexors and abductors, knee extensors, ankle dorsiflexion, and plantar flexion. No clonus or tremor. Reasonably good motor coordination is noted with the exception of the right upper extremity.   X-RAYS: Radiographs of the right hip from [facility] dated 12/01/2013 were reviewed. No significant degenerative changes appreciated to either hip. Good preservation of the cartilage space. No significant soft tissue calcification. No radiographic evidence of avascular necrosis. No evidence of fracture or dislocation.   IMPRESSION:  1.  Subjective lower extremity weakness and difficulty walking.  2.  Right hip pain, resolved.  PLAN: Findings were discussed with the patient and her family. I also discussed the findings  with Dr. Allena KatzPatel. I do not see any significant joint issues. She also demonstrates good strength with manual strength testing. Given her history of cerebrovascular accident and the family's description of progressive difficulty with cognitive measures, I believe it would be worthwhile to consider neurology consult.  ____________________________ Illene LabradorJames P. Angie FavaHooten Jr., MD jph:sb D: 12/02/2013 14:49:44 ET T: 12/02/2013 15:41:12 ET JOB#: [id number]  cc: Fayrene FearingJames P. Angie FavaHooten Jr., MD, <Dictator> Illene LabradorJAMES P Angie FavaHOOTEN JR MD ELECTRONICALLY SIGNED 12/23/2013 6:39

## 2015-03-20 ENCOUNTER — Encounter
Admission: RE | Admit: 2015-03-20 | Discharge: 2015-03-20 | Disposition: A | Discharge status: Home / Self Care | Payer: Medicare Other | Source: Ambulatory Visit | Attending: Ophthalmology | Admitting: Ophthalmology

## 2015-03-20 DIAGNOSIS — I10 Essential (primary) hypertension: Secondary | ICD-10-CM | POA: Diagnosis not present

## 2015-03-20 DIAGNOSIS — H2589 Other age-related cataract: Secondary | ICD-10-CM | POA: Insufficient documentation

## 2015-03-20 DIAGNOSIS — Z01810 Encounter for preprocedural cardiovascular examination: Secondary | ICD-10-CM | POA: Insufficient documentation

## 2015-03-21 ENCOUNTER — Encounter: Payer: Self-pay | Admitting: *Deleted

## 2015-03-21 DIAGNOSIS — H2511 Age-related nuclear cataract, right eye: Secondary | ICD-10-CM | POA: Diagnosis not present

## 2015-03-21 DIAGNOSIS — E780 Pure hypercholesterolemia: Secondary | ICD-10-CM | POA: Diagnosis not present

## 2015-03-21 DIAGNOSIS — F0390 Unspecified dementia without behavioral disturbance: Secondary | ICD-10-CM | POA: Diagnosis not present

## 2015-03-21 DIAGNOSIS — Z87891 Personal history of nicotine dependence: Secondary | ICD-10-CM | POA: Diagnosis not present

## 2015-03-21 DIAGNOSIS — Z7982 Long term (current) use of aspirin: Secondary | ICD-10-CM | POA: Diagnosis not present

## 2015-03-21 DIAGNOSIS — Z8673 Personal history of transient ischemic attack (TIA), and cerebral infarction without residual deficits: Secondary | ICD-10-CM | POA: Diagnosis not present

## 2015-03-21 DIAGNOSIS — Z79899 Other long term (current) drug therapy: Secondary | ICD-10-CM | POA: Diagnosis not present

## 2015-03-21 DIAGNOSIS — I10 Essential (primary) hypertension: Secondary | ICD-10-CM | POA: Diagnosis not present

## 2015-03-27 ENCOUNTER — Encounter: Payer: Self-pay | Admitting: *Deleted

## 2015-03-27 ENCOUNTER — Ambulatory Visit
Admission: RE | Admit: 2015-03-27 | Discharge: 2015-03-27 | Disposition: A | Discharge status: Home / Self Care | Payer: Medicare Other | Source: Ambulatory Visit | Attending: Ophthalmology | Admitting: Ophthalmology

## 2015-03-27 ENCOUNTER — Encounter
Admission: RE | Disposition: A | Discharge status: Home / Self Care | Payer: Self-pay | Source: Ambulatory Visit | Attending: Ophthalmology

## 2015-03-27 ENCOUNTER — Ambulatory Visit: Payer: Medicare Other | Admitting: Anesthesiology

## 2015-03-27 DIAGNOSIS — H2511 Age-related nuclear cataract, right eye: Secondary | ICD-10-CM | POA: Diagnosis not present

## 2015-03-27 DIAGNOSIS — I10 Essential (primary) hypertension: Secondary | ICD-10-CM | POA: Insufficient documentation

## 2015-03-27 DIAGNOSIS — F0390 Unspecified dementia without behavioral disturbance: Secondary | ICD-10-CM | POA: Insufficient documentation

## 2015-03-27 DIAGNOSIS — Z79899 Other long term (current) drug therapy: Secondary | ICD-10-CM | POA: Insufficient documentation

## 2015-03-27 DIAGNOSIS — Z8673 Personal history of transient ischemic attack (TIA), and cerebral infarction without residual deficits: Secondary | ICD-10-CM | POA: Insufficient documentation

## 2015-03-27 DIAGNOSIS — Z7982 Long term (current) use of aspirin: Secondary | ICD-10-CM | POA: Insufficient documentation

## 2015-03-27 DIAGNOSIS — E780 Pure hypercholesterolemia: Secondary | ICD-10-CM | POA: Insufficient documentation

## 2015-03-27 DIAGNOSIS — Z87891 Personal history of nicotine dependence: Secondary | ICD-10-CM | POA: Insufficient documentation

## 2015-03-27 HISTORY — DX: Unspecified dementia, unspecified severity, without behavioral disturbance, psychotic disturbance, mood disturbance, and anxiety: F03.90

## 2015-03-27 HISTORY — DX: Localized edema: R60.0

## 2015-03-27 HISTORY — DX: Essential (primary) hypertension: I10

## 2015-03-27 HISTORY — PX: CATARACT EXTRACTION W/PHACO: SHX586

## 2015-03-27 HISTORY — DX: Cerebral infarction, unspecified: I63.9

## 2015-03-27 SURGERY — PHACOEMULSIFICATION, CATARACT, WITH IOL INSERTION
Anesthesia: Monitor Anesthesia Care | Site: Eye | Laterality: Right | Wound class: Clean

## 2015-03-27 MED ORDER — TRYPAN BLUE 0.06 % OP SOLN
OPHTHALMIC | Status: AC
  Filled 2015-03-27: qty 0.5

## 2015-03-27 MED ORDER — POVIDONE-IODINE 5 % OP SOLN: 1.0000 "application " | OPHTHALMIC | Status: DC | PRN

## 2015-03-27 MED ORDER — ARMC OPHTHALMIC DILATING GEL
OPHTHALMIC | Status: AC
  Administered 2015-03-27: 1 via OPHTHALMIC
  Filled 2015-03-27: qty 0.25

## 2015-03-27 MED ORDER — TRYPAN BLUE 0.06 % OP SOLN
OPHTHALMIC | Status: DC | PRN
  Administered 2015-03-27: 0.5 mL via INTRAOCULAR

## 2015-03-27 MED ORDER — MOXIFLOXACIN HCL 0.5 % OP SOLN
OPHTHALMIC | Status: AC
  Filled 2015-03-27: qty 3

## 2015-03-27 MED ORDER — EPINEPHRINE HCL 1 MG/ML IJ SOLN
INTRAMUSCULAR | Status: AC
  Filled 2015-03-27: qty 1

## 2015-03-27 MED ORDER — ARMC OPHTHALMIC DILATING GEL
1.0000 "application " | OPHTHALMIC | Status: DC | PRN
  Administered 2015-03-27: 1 via OPHTHALMIC

## 2015-03-27 MED ORDER — LIDOCAINE HCL (PF) 4 % IJ SOLN
INTRAMUSCULAR | Status: AC
  Filled 2015-03-27: qty 5

## 2015-03-27 MED ORDER — LIDOCAINE HCL (PF) 1 % IJ SOLN
INTRAOCULAR | Status: DC | PRN
  Administered 2015-03-27: 09:00:00 via OPHTHALMIC

## 2015-03-27 MED ORDER — CEFUROXIME OPHTHALMIC INJECTION 1 MG/0.1 ML
INJECTION | OPHTHALMIC | Status: AC
  Filled 2015-03-27: qty 0.1

## 2015-03-27 MED ORDER — TETRACAINE HCL 0.5 % OP SOLN
OPHTHALMIC | Status: AC
  Filled 2015-03-27: qty 2

## 2015-03-27 MED ORDER — NA CHONDROIT SULF-NA HYALURON 40-17 MG/ML IO SOLN
INTRAOCULAR | Status: AC
  Filled 2015-03-27: qty 1

## 2015-03-27 MED ORDER — EPINEPHRINE HCL 1 MG/ML IJ SOLN
INTRAOCULAR | Status: DC | PRN
  Administered 2015-03-27: 200 mL

## 2015-03-27 MED ORDER — POVIDONE-IODINE 5 % OP SOLN
OPHTHALMIC | Status: AC
  Filled 2015-03-27: qty 30

## 2015-03-27 MED ORDER — MOXIFLOXACIN HCL 0.5 % OP SOLN - NO CHARGE
OPHTHALMIC | Status: DC | PRN
  Administered 2015-03-27: 1 [drp] via OPHTHALMIC

## 2015-03-27 MED ORDER — SODIUM CHLORIDE 0.9 % IV SOLN
INTRAVENOUS | Status: DC
  Administered 2015-03-27: 09:00:00 via INTRAVENOUS

## 2015-03-27 MED ORDER — TETRACAINE HCL 0.5 % OP SOLN: 1.0000 [drp] | OPHTHALMIC | Status: AC

## 2015-03-27 MED ORDER — CARBACHOL 0.01 % IO SOLN
INTRAOCULAR | Status: DC | PRN
  Administered 2015-03-27: 0.5 mL via INTRAOCULAR

## 2015-03-27 SURGICAL SUPPLY — 24 items
ACTIVE FMS ×2 IMPLANT
CANNULA ANT/CHMB 27GA (MISCELLANEOUS) ×2 IMPLANT
EYE SHIELD UNIVERSAL CLEAR (GAUZE/BANDAGES/DRESSINGS) ×2 IMPLANT
GLOVE BIO SURGEON STRL SZ8 (GLOVE) ×2 IMPLANT
GLOVE BIOGEL M 6.5 STRL (GLOVE) ×2 IMPLANT
GLOVE SURG LX 8.0 MICRO (GLOVE) ×1
GLOVE SURG LX STRL 8.0 MICRO (GLOVE) ×1 IMPLANT
GOWN STRL REUS W/ TWL LRG LVL3 (GOWN DISPOSABLE) ×2 IMPLANT
GOWN STRL REUS W/TWL LRG LVL3 (GOWN DISPOSABLE) ×2
LENS IOL TECNIS 21 (Intraocular Lens) ×1 IMPLANT
LENS IOL TECNIS 21.0 (Intraocular Lens) ×1 IMPLANT
LENS IOL TECNIS MONO 1P 21.0 (Intraocular Lens) ×1 IMPLANT
PACK CATARACT (MISCELLANEOUS) ×2 IMPLANT
PACK CATARACT BRASINGTON LX (MISCELLANEOUS) ×2 IMPLANT
PACK EYE AFTER SURG (MISCELLANEOUS) ×2 IMPLANT
SOL BSS BAG (MISCELLANEOUS) ×2
SOL PREP PVP 2OZ (MISCELLANEOUS) ×2
SOLUTION BSS BAG (MISCELLANEOUS) ×1 IMPLANT
SOLUTION PREP PVP 2OZ (MISCELLANEOUS) ×1 IMPLANT
SYR 5ML LL (SYRINGE) ×2 IMPLANT
SYR TB 1ML 27GX1/2 LL (SYRINGE) ×2 IMPLANT
WATER STERILE IRR 1000ML POUR (IV SOLUTION) ×2 IMPLANT
WIPE NON LINTING 3.25X3.25 (MISCELLANEOUS) ×2 IMPLANT
ZCB0021.0 TECNIS LENS ×2 IMPLANT

## 2015-03-27 NOTE — H&P (Signed)
  All labs reviewed. Abnormal studies sent to patients PCP when indicated.  Previous H&P reviewed, patient examined, there are NO CHANGES.  Laura Levy LOUIS5/24/20168:45 AM

## 2015-03-27 NOTE — Anesthesia Postprocedure Evaluation (Signed)
  Anesthesia Post-op Note  Patient: Laura Levy  Procedure(s) Performed: Procedure(s) with comments: CATARACT EXTRACTION PHACO AND INTRAOCULAR LENS PLACEMENT (IOC) (Right) - US 01:00 AP%28.9 CDE 17.42  Anesthesia type:MAC  Patient location: PACU  Post pain: Pain level controlled  Post assessment: Post-op Vital signs reviewed, Patient's Cardiovascular Status Stable, Respiratory Function Stable, Patent Airway and No signs of Nausea or vomiting  Post vital signs: Reviewed and stable  Last Vitals:  Filed Vitals:   03/27/15 0921  BP: 148/72  Pulse: 61  Temp: 36 C  Resp: 14    Level of consciousness: awake, alert  and patient cooperative  Complications: No apparent anesthesia complications

## 2015-03-27 NOTE — Transfer of Care (Signed)
Immediate Anesthesia Transfer of Care Note  Patient: Laura Levy  Procedure(s) Performed: Procedure(s) with comments: CATARACT EXTRACTION PHACO AND INTRAOCULAR LENS PLACEMENT (IOC) (Right) - US 01:00 AP%28.9 CDE 17.42  Patient Location: PACU and Short Stay  Anesthesia Type:MAC  Level of Consciousness: awake, alert , oriented and patient cooperative  Airway & Oxygen Therapy: Patient Spontanous Breathing  Post-op Assessment: Report given to RN, Post -op Vital signs reviewed and stable and Patient moving all extremities X 4  Post vital signs: Reviewed and stable  Last Vitals:  Filed Vitals:   03/27/15 0921  BP: 148/72  Pulse: 61  Temp: 36 C  Resp: 14    Complications: No apparent anesthesia complications

## 2015-03-27 NOTE — Op Note (Signed)
PREOPERATIVE DIAGNOSIS:  Nuclear sclerotic cataract of the right eye.   POSTOPERATIVE DIAGNOSIS: same   OPERATIVE PROCEDURE:  Procedure(s): CATARACT EXTRACTION PHACO AND INTRAOCULAR LENS PLACEMENT (IOC)   SURGEON:  Laura ManilaWilliam Chardonay [place], Laura Levy.   ANESTHESIA:  Anesthesiologist: Yevette EdwardsJames G Adams, MD CRNA: Michaele OfferKasey Savage, CRNA  1.      Managed anesthesia care. 2.      Topical tetracaine drops followed by 2% Xylocaine jelly applied in the preoperative holding area 3.  0.2 ml of epi-Shugarcaine was  placed in the anterior chamber following the paracentesis.   COMPLICATIONS:  None.   TECHNIQUE:   Stop and chop   DESCRIPTION OF PROCEDURE:  The patient was examined and consented in the preoperative holding area where the aforementioned topical anesthesia was applied to the right eye and then brought back to the Operating Room where the right eye was prepped and draped in the usual sterile ophthalmic fashion and a lid speculum was placed. A paracentesis was created with the side port blade and the anterior chamber was filled with viscoelastic.Vision Blue was used to stain the anterior capsule due to very poor/ no visualization of the red reflex. A near clear corneal incision was performed with the steel keratome. A continuous curvilinear capsulorrhexis was performed with a cystotome followed by the capsulorrhexis forceps. Hydrodissection and hydrodelineation were carried out with BSS on a blunt cannula. The lens was removed in a stop and chop  technique and the remaining cortical material was removed with the irrigation-aspiration handpiece. The capsular bag was inflated with viscoelastic and the Technis ZCB00  lens was placed in the capsular bag without complication. The remaining viscoelastic was removed from the eye with the irrigation-aspiration handpiece. The wounds were hydrated. The anterior chamber was flushed with Miostat and the eye was inflated to physiologic pressure. 0.1 mL of cefuroxime concentration  10 mg/mL was placed in the anterior chamber. The wounds were found to be water tight. The eye was dressed with Vigamox. The patient was given protective glasses to wear throughout the day and a shield with which to sleep tonight. The patient was also given drops with which to begin a drop regimen today and will follow-up with me in one day.  Implant Name Type Inv. Item Serial No. Manufacturer Lot No. LRB No. Used  LENS IMPL INTRAOC ZCB00 21.0 - [id number] Intraocular Lens LENS IMPL INTRAOC ZCB00 21.0 [id number]   Right 1   Procedure(s) with comments: CATARACT EXTRACTION PHACO AND INTRAOCULAR LENS PLACEMENT (IOC) (Right) - US 01:00 AP%28.9 CDE 17.42  Electronically signed: Lakeysha Slutsky LOUIS 03/27/2015 9:18 AM

## 2015-03-27 NOTE — Discharge Instructions (Signed)
See cataract post op handout  Eye Surgery Discharge Instructions  Expect mild scratchy sensation or mild soreness. DO NOT RUB YOUR EYE!  The day of surgery:  Minimal physical activity, but bed rest is not required  No reading, computer work, or close hand work  No bending, lifting, or straining.  May watch TV  For 24 hours:  No driving, legal decisions, or alcoholic beverages  Safety precautions  Eat anything you prefer: It is better to start with liquids, then soup then solid foods.  _____ Eye patch should be worn until postoperative exam tomorrow.  ____ Solar shield eyeglasses should be worn for comfort in the sunlight/patch while sleeping  Resume all regular medications including aspirin or Coumadin if these were discontinued prior to surgery. You may shower, bathe, shave, or wash your hair. Tylenol may be taken for mild discomfort.  Call your doctor if you experience significant pain, nausea, or vomiting, fever > 101 or other signs of infection. [phone] or [phone] Specific instructions:  Follow-up Information    Follow up with Carlena BjornstadPORFILIO,WILLIAM LOUIS, MD On 03/28/2015.   Specialty:  Ophthalmology   Why:  10:00   Contact information:   [address] [place] [address] [phone]

## 2015-03-27 NOTE — Anesthesia Preprocedure Evaluation (Signed)
Anesthesia Evaluation  Patient identified by MRN, date of birth, ID band Patient awake    Reviewed: Allergy & Precautions, H&P , NPO status , Patient's Chart, lab work & pertinent test results, reviewed documented beta blocker date and time   Airway Mallampati: II  TM Distance: >3 FB Neck ROM: full    Dental no notable dental hx.    Pulmonary neg pulmonary ROS, former smoker,  breath sounds clear to auscultation  Pulmonary exam normal       Cardiovascular Exercise Tolerance: Good hypertension, negative cardio ROS  Rhythm:regular Rate:Normal     Neuro/Psych CVA, No Residual Symptoms negative neurological ROS  negative psych ROS   GI/Hepatic negative GI ROS, Neg liver ROS,   Endo/Other  negative endocrine ROS  Renal/GU negative Renal ROS  negative genitourinary   Musculoskeletal   Abdominal   Peds  Hematology negative hematology ROS (+)   Anesthesia Other Findings   Reproductive/Obstetrics negative OB ROS                             Anesthesia Physical Anesthesia Plan  ASA: III  Anesthesia Plan: General   Post-op Pain Management:    Induction:   Airway Management Planned:   Additional Equipment:   Intra-op Plan:   Post-operative Plan:   Informed Consent: I have reviewed the patients History and Physical, chart, labs and discussed the procedure including the risks, benefits and alternatives for the proposed anesthesia with the patient or authorized representative who has indicated his/her understanding and acceptance.   Dental Advisory Given  Plan Discussed with: CRNA  Anesthesia Plan Comments:         Anesthesia Quick Evaluation  

## 2015-03-28 ENCOUNTER — Encounter: Payer: Self-pay | Admitting: Ophthalmology

## 2015-05-22 ENCOUNTER — Other Ambulatory Visit: Payer: Medicare Other

## 2015-05-28 ENCOUNTER — Encounter: Payer: Self-pay | Admitting: *Deleted

## 2015-05-28 DIAGNOSIS — E780 Pure hypercholesterolemia: Secondary | ICD-10-CM | POA: Diagnosis not present

## 2015-05-28 DIAGNOSIS — Z87891 Personal history of nicotine dependence: Secondary | ICD-10-CM | POA: Diagnosis not present

## 2015-05-28 DIAGNOSIS — I10 Essential (primary) hypertension: Secondary | ICD-10-CM | POA: Diagnosis not present

## 2015-05-28 DIAGNOSIS — H2512 Age-related nuclear cataract, left eye: Secondary | ICD-10-CM | POA: Diagnosis not present

## 2015-05-28 DIAGNOSIS — Z8673 Personal history of transient ischemic attack (TIA), and cerebral infarction without residual deficits: Secondary | ICD-10-CM | POA: Diagnosis not present

## 2015-05-28 DIAGNOSIS — F0390 Unspecified dementia without behavioral disturbance: Secondary | ICD-10-CM | POA: Diagnosis not present

## 2015-05-29 ENCOUNTER — Encounter
Admission: RE | Disposition: A | Discharge status: Home / Self Care | Payer: Self-pay | Source: Ambulatory Visit | Attending: Ophthalmology

## 2015-05-29 ENCOUNTER — Ambulatory Visit: Payer: Medicare Other | Admitting: Anesthesiology

## 2015-05-29 ENCOUNTER — Encounter: Payer: Self-pay | Admitting: *Deleted

## 2015-05-29 ENCOUNTER — Ambulatory Visit
Admission: RE | Admit: 2015-05-29 | Discharge: 2015-05-29 | Disposition: A | Discharge status: Home / Self Care | Payer: Medicare Other | Source: Ambulatory Visit | Attending: Ophthalmology | Admitting: Ophthalmology

## 2015-05-29 DIAGNOSIS — H2512 Age-related nuclear cataract, left eye: Secondary | ICD-10-CM | POA: Insufficient documentation

## 2015-05-29 DIAGNOSIS — Z8673 Personal history of transient ischemic attack (TIA), and cerebral infarction without residual deficits: Secondary | ICD-10-CM | POA: Insufficient documentation

## 2015-05-29 DIAGNOSIS — E780 Pure hypercholesterolemia: Secondary | ICD-10-CM | POA: Insufficient documentation

## 2015-05-29 DIAGNOSIS — Z87891 Personal history of nicotine dependence: Secondary | ICD-10-CM | POA: Insufficient documentation

## 2015-05-29 DIAGNOSIS — I10 Essential (primary) hypertension: Secondary | ICD-10-CM | POA: Insufficient documentation

## 2015-05-29 DIAGNOSIS — F0390 Unspecified dementia without behavioral disturbance: Secondary | ICD-10-CM | POA: Insufficient documentation

## 2015-05-29 HISTORY — PX: CATARACT EXTRACTION W/PHACO: SHX586

## 2015-05-29 SURGERY — PHACOEMULSIFICATION, CATARACT, WITH IOL INSERTION
Anesthesia: Monitor Anesthesia Care | Laterality: Left

## 2015-05-29 MED ORDER — CEFUROXIME OPHTHALMIC INJECTION 1 MG/0.1 ML
INJECTION | OPHTHALMIC | Status: AC
  Filled 2015-05-29: qty 0.1

## 2015-05-29 MED ORDER — POVIDONE-IODINE 5 % OP SOLN
OPHTHALMIC | Status: AC
  Filled 2015-05-29: qty 30

## 2015-05-29 MED ORDER — MOXIFLOXACIN HCL 0.5 % OP SOLN
OPHTHALMIC | Status: DC | PRN
  Administered 2015-05-29: 2 [drp] via OPHTHALMIC

## 2015-05-29 MED ORDER — ARMC OPHTHALMIC DILATING GEL
OPHTHALMIC | Status: AC
  Filled 2015-05-29: qty 0.25

## 2015-05-29 MED ORDER — CEFUROXIME OPHTHALMIC INJECTION 1 MG/0.1 ML
INJECTION | OPHTHALMIC | Status: DC | PRN
  Administered 2015-05-29: 0.1 mL via INTRACAMERAL

## 2015-05-29 MED ORDER — EPINEPHRINE HCL 1 MG/ML IJ SOLN
INTRAMUSCULAR | Status: AC
  Filled 2015-05-29: qty 1

## 2015-05-29 MED ORDER — ARMC OPHTHALMIC DILATING GEL
1.0000 "application " | OPHTHALMIC | Status: AC | PRN
  Administered 2015-05-29 (×2): 1 via OPHTHALMIC

## 2015-05-29 MED ORDER — FENTANYL CITRATE (PF) 100 MCG/2ML IJ SOLN
INTRAMUSCULAR | Status: DC | PRN
  Administered 2015-05-29: 25 ug via INTRAVENOUS

## 2015-05-29 MED ORDER — EPINEPHRINE HCL 1 MG/ML IJ SOLN
INTRAOCULAR | Status: DC | PRN
  Administered 2015-05-29: 250 mL

## 2015-05-29 MED ORDER — SODIUM CHLORIDE 0.9 % IV SOLN
INTRAVENOUS | Status: DC
  Administered 2015-05-29: 07:00:00 via INTRAVENOUS

## 2015-05-29 MED ORDER — MOXIFLOXACIN HCL 0.5 % OP SOLN
OPHTHALMIC | Status: AC
  Filled 2015-05-29: qty 3

## 2015-05-29 MED ORDER — POVIDONE-IODINE 5 % OP SOLN
1.0000 "application " | OPHTHALMIC | Status: AC | PRN
  Administered 2015-05-29: 1 via OPHTHALMIC

## 2015-05-29 MED ORDER — NA CHONDROIT SULF-NA HYALURON 40-17 MG/ML IO SOLN
INTRAOCULAR | Status: AC
  Filled 2015-05-29: qty 1

## 2015-05-29 MED ORDER — TETRACAINE HCL 0.5 % OP SOLN
OPHTHALMIC | Status: AC
  Filled 2015-05-29: qty 2

## 2015-05-29 MED ORDER — MOXIFLOXACIN HCL 0.5 % OP SOLN
1.0000 [drp] | Freq: Once | OPHTHALMIC | Status: DC
Start: 2015-05-29 — End: 2015-05-29

## 2015-05-29 MED ORDER — TETRACAINE HCL 0.5 % OP SOLN
1.0000 [drp] | OPHTHALMIC | Status: AC | PRN
  Administered 2015-05-29: 1 [drp] via OPHTHALMIC

## 2015-05-29 MED ORDER — MIDAZOLAM HCL 2 MG/2ML IJ SOLN
INTRAMUSCULAR | Status: DC | PRN
  Administered 2015-05-29: 0.5 mg via INTRAVENOUS

## 2015-05-29 MED ORDER — CARBACHOL 0.01 % IO SOLN
INTRAOCULAR | Status: DC | PRN
  Administered 2015-05-29: .5 mL via INTRAOCULAR

## 2015-05-29 SURGICAL SUPPLY — 20 items
CANNULA ANT/CHMB 27GA (MISCELLANEOUS) ×3 IMPLANT
GLOVE BIO SURGEON STRL SZ8 (GLOVE) ×3 IMPLANT
GLOVE BIOGEL M 6.5 STRL (GLOVE) ×3 IMPLANT
GLOVE SURG LX 8.0 MICRO (GLOVE) ×2
GLOVE SURG LX STRL 8.0 MICRO (GLOVE) ×1 IMPLANT
GOWN STRL REUS W/ TWL LRG LVL3 (GOWN DISPOSABLE) ×2 IMPLANT
GOWN STRL REUS W/TWL LRG LVL3 (GOWN DISPOSABLE) ×4
LENS IOL TECNIS 22.0 (Intraocular Lens) ×3 IMPLANT
LENS IOL TECNIS MONO 1P 22.0 (Intraocular Lens) ×1 IMPLANT
PACK CATARACT (MISCELLANEOUS) ×3 IMPLANT
PACK CATARACT BRASINGTON LX (MISCELLANEOUS) ×3 IMPLANT
PACK EYE AFTER SURG (MISCELLANEOUS) ×3 IMPLANT
SOL BSS BAG (MISCELLANEOUS) ×3
SOL PREP PVP 2OZ (MISCELLANEOUS) ×3
SOLUTION BSS BAG (MISCELLANEOUS) ×1 IMPLANT
SOLUTION PREP PVP 2OZ (MISCELLANEOUS) ×1 IMPLANT
SYR 5ML LL (SYRINGE) ×3 IMPLANT
SYR TB 1ML 27GX1/2 LL (SYRINGE) ×3 IMPLANT
WATER STERILE IRR 1000ML POUR (IV SOLUTION) ×3 IMPLANT
WIPE NON LINTING 3.25X3.25 (MISCELLANEOUS) ×3 IMPLANT

## 2015-05-29 NOTE — Anesthesia Postprocedure Evaluation (Signed)
  Anesthesia Post-op Note  Patient: Laura Levy  Procedure(s) Performed: Procedure(s) with comments: CATARACT EXTRACTION PHACO AND INTRAOCULAR LENS PLACEMENT (IOC) (Left) - cassette lot# [id number] H    [place] 00:27.7  AP 22.3 CDE  6.17  Anesthesia type:MAC  Patient location: PACU  Post pain: Pain level controlled  Post assessment: Post-op Vital signs reviewed, Patient's Cardiovascular Status Stable, Respiratory Function Stable, Patent Airway and No signs of Nausea or vomiting  Post vital signs: Reviewed and stable  Last Vitals:  Filed Vitals:   05/29/15 0901  BP: 123/61  Pulse:   Temp:   Resp:     Level of consciousness: awake, alert  and patient cooperative  Complications: No apparent anesthesia complications

## 2015-05-29 NOTE — H&P (Signed)
  All labs reviewed. Abnormal studies sent to patients PCP when indicated.  Previous H&P reviewed, patient examined, there are NO CHANGES.  Laura Levy LOUIS7/26/20168:19 AM

## 2015-05-29 NOTE — Discharge Instructions (Signed)
AMBULATORY SURGERY  °DISCHARGE INSTRUCTIONS ° ° °1) The drugs that you were given will stay in your system until tomorrow so for the next 24 hours you should not: ° °A) Drive an automobile °B) Make any legal decisions °C) Drink any alcoholic beverage ° ° °2) You may resume regular meals tomorrow.  Today it is better to start with liquids and gradually work up to solid foods. ° °You may eat anything you prefer, but it is better to start with liquids, then soup and crackers, and gradually work up to solid foods. ° ° °3) Please notify your doctor immediately if you have any unusual bleeding, trouble breathing, redness and pain at the surgery site, drainage, fever, or pain not relieved by medication. ° ° ° °4) Additional Instructions: ° ° ° °Cataract Surgery °Care After °Refer to this sheet in the next few weeks. These instructions provide you with information on caring for yourself after your procedure. Your caregiver may also give you more specific instructions. Your treatment has been planned according to current medical practices, but problems sometimes occur. Call your caregiver if you have any problems or questions after your procedure.  °HOME CARE INSTRUCTIONS  °· Avoid strenuous activities as directed by your caregiver. °· Ask your caregiver when you can resume driving. °· Use eyedrops or other medicines to help healing and control pressure inside your eye as directed by your caregiver. °· Only take over-the-counter or prescription medicines for pain, discomfort, or fever as directed by your caregiver. °· Do not to touch or rub your eyes. °· You may be instructed to use a protective shield during the first few days and nights after surgery. If not, wear sunglasses to protect your eyes. This is to protect the eye from pressure or from being accidentally bumped. °· Keep the area around your eye clean and dry. Avoid swimming or allowing water to hit you directly in the face while showering. Keep soap and shampoo  out of your eyes. °· Do not bend or lift heavy objects. Bending increases pressure in the eye. You can walk, climb stairs, and do light household chores. °· Do not put a contact lens into the eye that had surgery until your caregiver says it is okay to do so. °· Ask your doctor when you can return to work. This will depend on the kind of work that you do. If you work in a dusty environment, you may be advised to wear protective eyewear for a period of time. °· Ask your caregiver when it will be safe to engage in sexual activity. °· Continue with your regular eye exams as directed by your caregiver. °What to expect: °· It is normal to feel itching and mild discomfort for a few days after cataract surgery. Some fluid discharge is also common, and your eye may be sensitive to light and touch. °· After 1 to 2 days, even moderate discomfort should disappear. In most cases, healing will take about 6 weeks. °· If you received an intraocular lens (IOL), you may notice that colors are very bright or have a blue tinge. Also, if you have been in bright sunlight, everything may appear reddish for a few hours. If you see these color tinges, it is because your lens is clear and no longer cloudy. Within a few months after receiving an IOL, these extra colors should go away. When you have healed, you will probably need new glasses. °SEEK MEDICAL CARE IF:  °· You have increased bruising around your   eye. °· You have discomfort not helped by medicine. °SEEK IMMEDIATE MEDICAL CARE IF:  °· You have a  fever. °· You have a worsening or sudden vision loss. °· You have redness, swelling, or increasing pain in the eye. °· You have a thick discharge from the eye that had surgery. °MAKE SURE YOU: °· Understand these instructions. °· Will watch your condition. °· Will get help right away if you are not doing well or get worse. °Document Released: 05/09/2005 Document Revised: 01/12/2012 Document Reviewed: 06/13/2011 °[employer]® Patient  Information ©2015 [employer]. This information is not intended to replace advice given to you by your health care provider. Make sure you discuss any questions you have with your health care provider. ° ° ° ° °Please contact your physician with any problems or Same Day Surgery at [phone], Monday through Friday 6 am to 4 pm, or  at [facility] number at [phone]. °

## 2015-05-29 NOTE — Anesthesia Postprocedure Evaluation (Signed)
  Anesthesia Post-op Note  Patient: Laura Levy  Procedure(s) Performed: Procedure(s) with comments: CATARACT EXTRACTION PHACO AND INTRAOCULAR LENS PLACEMENT (IOC) (Left) - cassette lot# [id number] H    [place] 00:27.7  AP 22.3 CDE  6.17  Anesthesia type:MAC  Patient location: PACU  Post pain: Pain level controlled  Post assessment: Post-op Vital signs reviewed, Patient's Cardiovascular Status Stable, Respiratory Function Stable, Patent Airway and No signs of Nausea or vomiting  Post vital signs: Reviewed and stable  Last Vitals:  Filed Vitals:   05/29/15 0848  BP: 115/59  Pulse:   Temp: 36.8 C  Resp: 18    Level of consciousness: awake, alert  and patient cooperative  Complications: No apparent anesthesia complications

## 2015-05-29 NOTE — Anesthesia Preprocedure Evaluation (Signed)
Anesthesia Evaluation  Patient identified by MRN, date of birth, ID band Patient awake    Reviewed: Allergy & Precautions, H&P , NPO status , Patient's Chart, lab work & pertinent test results, reviewed documented beta blocker date and time   Airway Mallampati: II  TM Distance: >3 FB Neck ROM: full    Dental no notable dental hx. (+) Teeth Intact   Pulmonary neg pulmonary ROS, former smoker,  breath sounds clear to auscultation  Pulmonary exam normal       Cardiovascular Exercise Tolerance: Good hypertension, negative cardio ROS  Rhythm:regular Rate:Normal     Neuro/Psych negative neurological ROS  negative psych ROS   GI/Hepatic negative GI ROS, Neg liver ROS,   Endo/Other  negative endocrine ROSdiabetes  Renal/GU      Musculoskeletal   Abdominal   Peds  Hematology negative hematology ROS (+)   Anesthesia Other Findings   Reproductive/Obstetrics negative OB ROS                             Anesthesia Physical Anesthesia Plan  ASA: III  Anesthesia Plan: MAC   Post-op Pain Management:    Induction:   Airway Management Planned:   Additional Equipment:   Intra-op Plan:   Post-operative Plan:   Informed Consent: I have reviewed the patients History and Physical, chart, labs and discussed the procedure including the risks, benefits and alternatives for the proposed anesthesia with the patient or authorized representative who has indicated his/her understanding and acceptance.     Plan Discussed with: CRNA  Anesthesia Plan Comments:         Anesthesia Quick Evaluation

## 2015-05-29 NOTE — Op Note (Signed)
PREOPERATIVE DIAGNOSIS:  Nuclear sclerotic cataract of the left eye.   POSTOPERATIVE DIAGNOSIS:  NUCLEAR SCLEROTIC  CATARACT LEFT EYE   OPERATIVE PROCEDURE:  Procedure(s): CATARACT EXTRACTION PHACO AND INTRAOCULAR LENS PLACEMENT (IOC)   SURGEON:  Galen Manila, MD.   ANESTHESIA:   Anesthesiologist: Yevette Edwards, MD CRNA: Darrol Jump, CRNA  1.      Managed anesthesia care. 2.      Topical tetracaine drops followed by 2% Xylocaine jelly applied in the preoperative holding area.   COMPLICATIONS:  None.   TECHNIQUE:   Stop and chop   DESCRIPTION OF PROCEDURE:  The patient was examined and consented in the preoperative holding area where the aforementioned topical anesthesia was applied to the left eye and then brought back to the Operating Room where the left eye was prepped and draped in the usual sterile ophthalmic fashion and a lid speculum was placed. A paracentesis was created with the side port blade and the anterior chamber was filled with viscoelastic. A near clear corneal incision was performed with the steel keratome. A continuous curvilinear capsulorrhexis was performed with a cystotome followed by the capsulorrhexis forceps. Hydrodissection and hydrodelineation were carried out with BSS on a blunt cannula. The lens was removed in a stop and chop  technique and the remaining cortical material was removed with the irrigation-aspiration handpiece. The capsular bag was inflated with viscoelastic and the Technis ZCB00 lens was placed in the capsular bag without complication. The remaining viscoelastic was removed from the eye with the irrigation-aspiration handpiece. The wounds were hydrated. The anterior chamber was flushed with Miostat and the eye was inflated to physiologic pressure. 0.1 mL of cefuroxime concentration 10 mg/mL was placed in the anterior chamber. The wounds were found to be water tight. The eye was dressed with Vigamox. The patient was given protective glasses to wear  throughout the day and a shield with which to sleep tonight. The patient was also given drops with which to begin a drop regimen today and will follow-up with me in one day.  Implant Name Type Inv. Item Serial No. Manufacturer Lot No. LRB No. Used  LENS IMPL INTRAOC ZCB00 22.0 - [id number] Intraocular Lens LENS IMPL INTRAOC ZCB00 22.0 [id number] AMO   Left 1   Procedure(s) with comments: CATARACT EXTRACTION PHACO AND INTRAOCULAR LENS PLACEMENT (IOC) (Left) - cassette lot# [id number] H    [place] 00:27.7  AP 22.3 CDE  6.17  Electronically signed: Aws Shere LOUIS 05/29/2015 8:45 AM

## 2015-05-29 NOTE — Transfer of Care (Signed)
Immediate Anesthesia Transfer of Care Note  Patient: Laura Levy  Procedure(s) Performed: Procedure(s) with comments: CATARACT EXTRACTION PHACO AND INTRAOCULAR LENS PLACEMENT (IOC) (Left) - cassette lot# [id number] H    [place] 00:27.7  AP 22.3 CDE  6.17  Patient Location: PACU  Anesthesia Type:MAC  Level of Consciousness: awake, alert , oriented and patient cooperative  Airway & Oxygen Therapy: Patient Spontanous Breathing  Post-op Assessment: Report given to RN and Post -op Vital signs reviewed and stable  Post vital signs: Reviewed and stable  Last Vitals:  Filed Vitals:   05/29/15 0848  BP: 115/59  Pulse:   Temp: 36.8 C  Resp: 18    Complications: No apparent anesthesia complications

## 2016-01-01 ENCOUNTER — Emergency Department: Payer: Medicare Other

## 2016-01-01 ENCOUNTER — Encounter: Payer: Self-pay | Admitting: Medical Oncology

## 2016-01-01 ENCOUNTER — Observation Stay
Admission: EM | Admit: 2016-01-01 | Discharge: 2016-01-03 | Disposition: A | Discharge status: Home / Self Care | Payer: Medicare Other | Attending: Internal Medicine | Admitting: Internal Medicine

## 2016-01-01 DIAGNOSIS — R278 Other lack of coordination: Secondary | ICD-10-CM | POA: Diagnosis present

## 2016-01-01 DIAGNOSIS — E876 Hypokalemia: Secondary | ICD-10-CM | POA: Diagnosis not present

## 2016-01-01 DIAGNOSIS — Z9842 Cataract extraction status, left eye: Secondary | ICD-10-CM | POA: Insufficient documentation

## 2016-01-01 DIAGNOSIS — J439 Emphysema, unspecified: Secondary | ICD-10-CM | POA: Insufficient documentation

## 2016-01-01 DIAGNOSIS — I10 Essential (primary) hypertension: Secondary | ICD-10-CM | POA: Diagnosis not present

## 2016-01-01 DIAGNOSIS — R748 Abnormal levels of other serum enzymes: Secondary | ICD-10-CM | POA: Insufficient documentation

## 2016-01-01 DIAGNOSIS — Z961 Presence of intraocular lens: Secondary | ICD-10-CM | POA: Insufficient documentation

## 2016-01-01 DIAGNOSIS — Z8673 Personal history of transient ischemic attack (TIA), and cerebral infarction without residual deficits: Secondary | ICD-10-CM | POA: Insufficient documentation

## 2016-01-01 DIAGNOSIS — F0390 Unspecified dementia without behavioral disturbance: Secondary | ICD-10-CM | POA: Insufficient documentation

## 2016-01-01 DIAGNOSIS — R778 Other specified abnormalities of plasma proteins: Secondary | ICD-10-CM

## 2016-01-01 DIAGNOSIS — R531 Weakness: Secondary | ICD-10-CM

## 2016-01-01 DIAGNOSIS — N281 Cyst of kidney, acquired: Secondary | ICD-10-CM | POA: Insufficient documentation

## 2016-01-01 DIAGNOSIS — R918 Other nonspecific abnormal finding of lung field: Secondary | ICD-10-CM | POA: Insufficient documentation

## 2016-01-01 DIAGNOSIS — R7989 Other specified abnormal findings of blood chemistry: Secondary | ICD-10-CM | POA: Diagnosis present

## 2016-01-01 DIAGNOSIS — M8588 Other specified disorders of bone density and structure, other site: Secondary | ICD-10-CM | POA: Diagnosis not present

## 2016-01-01 DIAGNOSIS — Z87891 Personal history of nicotine dependence: Secondary | ICD-10-CM | POA: Insufficient documentation

## 2016-01-01 DIAGNOSIS — K8020 Calculus of gallbladder without cholecystitis without obstruction: Secondary | ICD-10-CM | POA: Diagnosis not present

## 2016-01-01 DIAGNOSIS — M479 Spondylosis, unspecified: Secondary | ICD-10-CM | POA: Diagnosis not present

## 2016-01-01 DIAGNOSIS — E785 Hyperlipidemia, unspecified: Secondary | ICD-10-CM | POA: Diagnosis not present

## 2016-01-01 DIAGNOSIS — G9341 Metabolic encephalopathy: Secondary | ICD-10-CM | POA: Diagnosis not present

## 2016-01-01 DIAGNOSIS — Z8249 Family history of ischemic heart disease and other diseases of the circulatory system: Secondary | ICD-10-CM | POA: Insufficient documentation

## 2016-01-01 DIAGNOSIS — R1110 Vomiting, unspecified: Secondary | ICD-10-CM | POA: Diagnosis not present

## 2016-01-01 DIAGNOSIS — Z9841 Cataract extraction status, right eye: Secondary | ICD-10-CM | POA: Insufficient documentation

## 2016-01-01 DIAGNOSIS — N390 Urinary tract infection, site not specified: Secondary | ICD-10-CM | POA: Diagnosis not present

## 2016-01-01 DIAGNOSIS — Z7901 Long term (current) use of anticoagulants: Secondary | ICD-10-CM | POA: Diagnosis not present

## 2016-01-01 DIAGNOSIS — Z7982 Long term (current) use of aspirin: Secondary | ICD-10-CM | POA: Insufficient documentation

## 2016-01-01 LAB — COMPREHENSIVE METABOLIC PANEL
ALBUMIN: 3.2 g/dL — AB (ref 3.5–5.0)
ALK PHOS: 72 U/L (ref 38–126)
ALT: 15 U/L (ref 14–54)
ANION GAP: 7 (ref 5–15)
AST: 32 U/L (ref 15–41)
BILIRUBIN TOTAL: 1.8 mg/dL — AB (ref 0.3–1.2)
BUN: 14 mg/dL (ref 6–20)
CALCIUM: 8.2 mg/dL — AB (ref 8.9–10.3)
CO2: 28 mmol/L (ref 22–32)
Chloride: 103 mmol/L (ref 101–111)
Creatinine, Ser: 1.26 mg/dL — ABNORMAL HIGH (ref 0.44–1.00)
GFR calc Af Amer: 48 mL/min — ABNORMAL LOW (ref >60)
GFR calc non Af Amer: 42 mL/min — ABNORMAL LOW (ref >60)
GLUCOSE: 90 mg/dL (ref 65–99)
POTASSIUM: 2.8 mmol/L — AB (ref 3.5–5.1)
SODIUM: 138 mmol/L (ref 135–145)
Total Protein: 7.2 g/dL (ref 6.5–8.1)

## 2016-01-01 LAB — URINALYSIS COMPLETE WITH MICROSCOPIC (ARMC ONLY)
Bilirubin Urine: NEGATIVE
Glucose, UA: NEGATIVE mg/dL
Ketones, ur: NEGATIVE mg/dL
Nitrite: NEGATIVE
PH: 6 (ref 5.0–8.0)
PROTEIN: 30 mg/dL — AB
Specific Gravity, Urine: 1.038 — ABNORMAL HIGH (ref 1.005–1.030)

## 2016-01-01 LAB — CBC WITH DIFFERENTIAL/PLATELET
Basophils Absolute: 0 10*3/uL (ref 0–0.1)
Eosinophils Absolute: 0.2 10*3/uL (ref 0–0.7)
Eosinophils Relative: 2 %
HEMATOCRIT: 30.5 % — AB (ref 35.0–47.0)
HEMOGLOBIN: 10.3 g/dL — AB (ref 12.0–16.0)
LYMPHS ABS: 1 10*3/uL (ref 1.0–3.6)
LYMPHS PCT: 10 %
MCH: 28.8 pg (ref 26.0–34.0)
MCHC: 33.8 g/dL (ref 32.0–36.0)
MCV: 85.2 fL (ref 80.0–100.0)
MONOS PCT: 7 %
Monocytes Absolute: 0.7 10*3/uL (ref 0.2–0.9)
NEUTROS PCT: 81 %
Neutro Abs: 8.2 10*3/uL — ABNORMAL HIGH (ref 1.4–6.5)
Platelets: 47 10*3/uL — ABNORMAL LOW (ref 150–440)
RBC: 3.58 MIL/uL — ABNORMAL LOW (ref 3.80–5.20)
RDW: 15.1 % — AB (ref 11.5–14.5)
WBC: 10.1 10*3/uL (ref 3.6–11.0)

## 2016-01-01 LAB — MAGNESIUM: Magnesium: 1.8 mg/dL (ref 1.7–2.4)

## 2016-01-01 LAB — TROPONIN I
TROPONIN I: 0.44 ng/mL — AB (ref <0.031)
TROPONIN I: 0.47 ng/mL — AB (ref <0.031)
Troponin I: 0.28 ng/mL — ABNORMAL HIGH (ref <0.031)

## 2016-01-01 LAB — CK: CK TOTAL: 131 U/L (ref 38–234)

## 2016-01-01 LAB — LACTIC ACID, PLASMA: Lactic Acid, Venous: 1.2 mmol/L (ref 0.5–2.0)

## 2016-01-01 LAB — BRAIN NATRIURETIC PEPTIDE: B NATRIURETIC PEPTIDE 5: 166 pg/mL — AB (ref 0.0–100.0)

## 2016-01-01 LAB — PHOSPHORUS: PHOSPHORUS: 2.6 mg/dL (ref 2.5–4.6)

## 2016-01-01 MED ORDER — ASPIRIN EC 81 MG PO TBEC
81.0000 mg | DELAYED_RELEASE_TABLET | Freq: Every day | ORAL | Status: DC
  Administered 2016-01-01 – 2016-01-03 (×3): 81 mg via ORAL
  Filled 2016-01-01 (×3): qty 1

## 2016-01-01 MED ORDER — ONDANSETRON HCL 4 MG PO TABS: 4.0000 mg | ORAL_TABLET | Freq: Four times a day (QID) | ORAL | Status: DC | PRN

## 2016-01-01 MED ORDER — POTASSIUM CHLORIDE 20 MEQ/15ML (10%) PO SOLN
60.0000 meq | Freq: Once | ORAL | Status: AC
  Administered 2016-01-01: 60 meq via ORAL
  Filled 2016-01-01: qty 45

## 2016-01-01 MED ORDER — DEXTROSE 5 % IV SOLN
1.0000 g | INTRAVENOUS | Status: DC
  Administered 2016-01-02: 1 g via INTRAVENOUS
  Filled 2016-01-01 (×2): qty 10

## 2016-01-01 MED ORDER — ACETAMINOPHEN 650 MG RE SUPP: 650.0000 mg | Freq: Four times a day (QID) | RECTAL | Status: DC | PRN

## 2016-01-01 MED ORDER — DEXTROSE 5 % IV SOLN
1.0000 g | Freq: Once | INTRAVENOUS | Status: AC
  Administered 2016-01-01: 1 g via INTRAVENOUS
  Filled 2016-01-01: qty 10

## 2016-01-01 MED ORDER — HEPARIN SODIUM (PORCINE) 5000 UNIT/ML IJ SOLN
5000.0000 [IU] | Freq: Three times a day (TID) | INTRAMUSCULAR | Status: DC
  Administered 2016-01-01 – 2016-01-02 (×4): 5000 [IU] via SUBCUTANEOUS
  Filled 2016-01-01 (×4): qty 1

## 2016-01-01 MED ORDER — SODIUM CHLORIDE 0.9% FLUSH
3.0000 mL | Freq: Two times a day (BID) | INTRAVENOUS | Status: DC
  Administered 2016-01-02 – 2016-01-03 (×3): 3 mL via INTRAVENOUS

## 2016-01-01 MED ORDER — VITAMIN B-12 1000 MCG PO TABS
1000.0000 ug | ORAL_TABLET | Freq: Every day | ORAL | Status: DC
  Administered 2016-01-01 – 2016-01-03 (×3): 1000 ug via ORAL
  Filled 2016-01-01 (×3): qty 1

## 2016-01-01 MED ORDER — POTASSIUM CHLORIDE IN NACL 20-0.9 MEQ/L-% IV SOLN
INTRAVENOUS | Status: DC
  Administered 2016-01-01 – 2016-01-02 (×2): via INTRAVENOUS
  Filled 2016-01-01 (×3): qty 1000

## 2016-01-01 MED ORDER — ACETAMINOPHEN 325 MG PO TABS: 650.0000 mg | ORAL_TABLET | Freq: Four times a day (QID) | ORAL | Status: DC | PRN

## 2016-01-01 MED ORDER — AMLODIPINE BESYLATE 10 MG PO TABS
10.0000 mg | ORAL_TABLET | Freq: Every day | ORAL | Status: DC
  Administered 2016-01-01 – 2016-01-03 (×3): 10 mg via ORAL
  Filled 2016-01-01 (×3): qty 1

## 2016-01-01 MED ORDER — ALBUTEROL SULFATE (2.5 MG/3ML) 0.083% IN NEBU: 2.5000 mg | INHALATION_SOLUTION | RESPIRATORY_TRACT | Status: DC | PRN

## 2016-01-01 MED ORDER — CLOPIDOGREL BISULFATE 75 MG PO TABS
75.0000 mg | ORAL_TABLET | Freq: Every day | ORAL | Status: DC
  Administered 2016-01-01 – 2016-01-03 (×3): 75 mg via ORAL
  Filled 2016-01-01 (×3): qty 1

## 2016-01-01 MED ORDER — DEXTROSE 5 % IV SOLN: 1.0000 g | INTRAVENOUS | Status: DC

## 2016-01-01 MED ORDER — ONDANSETRON HCL 4 MG/2ML IJ SOLN
4.0000 mg | Freq: Four times a day (QID) | INTRAMUSCULAR | Status: DC | PRN
Start: 2016-01-01 — End: 2016-01-03

## 2016-01-01 MED ORDER — ATORVASTATIN CALCIUM 20 MG PO TABS
80.0000 mg | ORAL_TABLET | Freq: Every day | ORAL | Status: DC
  Administered 2016-01-01 – 2016-01-03 (×3): 80 mg via ORAL
  Filled 2016-01-01 (×3): qty 4

## 2016-01-01 MED ORDER — IOHEXOL 300 MG/ML  SOLN
75.0000 mL | Freq: Once | INTRAMUSCULAR | Status: AC | PRN
  Administered 2016-01-01: 75 mL via INTRAVENOUS

## 2016-01-01 NOTE — ED Notes (Signed)
Pt reports that she fell yesterday, pt unsure if she hit head. Pt takes plavix. Pt began having vomiting this am.

## 2016-01-01 NOTE — Care Management Obs Status (Signed)
MEDICARE OBSERVATION STATUS NOTIFICATION   Patient Details  Name: Laura Levy MRN: [id number] Date of Birth: 15-Jul-1941   Medicare Observation Status Notification Given:  Yes    Berna Bue, RN 01/01/2016, 1:34 PM

## 2016-01-01 NOTE — H&P (Addendum)
[facility] - [place] at [facility]   PATIENT NAME: Laura Levy    MR#:  [id number]  DATE OF BIRTH:  18-Jul-1941  DATE OF ADMISSION:  01/01/2016  PRIMARY CARE PHYSICIAN: Dorothey Baseman, MD   REQUESTING/REFERRING PHYSICIAN: Arnaldo Natal, MD  CHIEF COMPLAINT:   Chief Complaint  Patient presents with  . Fall  . Emesis  fall and weakness today.  HISTORY OF PRESENT ILLNESS:  Laura Levy  is a 75 y.o. female with a known history of  Hypertension,hypelipidemi and stroke. The patit was sent to ED from home due to fall and weakness today. She was found on the floor for about 3 or 4 hours at home by her family last night. She is unable to stand or walk. But she denies any fever, chills , dysuria, uinary frequency or hematuria.she is a poor historian. She was found to have UTI and low potassium at 2.8.  PAST MEDICAL HISTORY:   Past Medical History  Diagnosis Date  . Hypertension   . Stroke (HCC)   . Dementia   . Edema leg     PAST SURGICAL HISTORY:   Past Surgical History  Procedure Laterality Date  . Cataract extraction w/phaco Right 03/27/2015    Procedure: CATARACT EXTRACTION PHACO AND INTRAOCULAR LENS PLACEMENT (IOC);  Surgeon: Galen Manila, MD;  Location: [facility] ORS;  Service: Ophthalmology;  Laterality: Right;  [place] 01:00 AP%28.9 CDE 17.42  . Cataract extraction w/phaco Left 05/29/2015    Procedure: CATARACT EXTRACTION PHACO AND INTRAOCULAR LENS PLACEMENT (IOC);  Surgeon: Galen Manila, MD;  Location: [facility] ORS;  Service: Ophthalmology;  Laterality: Left;  cassette lot# [id number]    [place] 00:27.7  AP 22.3 CDE  6.17    SOCIAL HISTORY:   Social History  Substance Use Topics  . Smoking status: Former Games developer  . Smokeless tobacco: Not on file  . Alcohol Use: No    FAMILY HISTORY:   Family History  Problem Relation Age of Onset  . Congestive Heart Failure Mother     DRUG ALLERGIES:  No Known Allergies  REVIEW OF SYSTEMS:   th patient is  demented ,unable to get ROS.  MEDICATIONS AT HOME:   Prior to Admission medications   Medication Sig Start Date End Date Taking? Authorizing Provider  amLODipine (NORVASC) 10 MG tablet Take 10 mg by mouth daily.    Historical Provider, MD  aspirin EC 81 MG tablet Take 81 mg by mouth daily.    Historical Provider, MD  atorvastatin (LIPITOR) 80 MG tablet Take 80 mg by mouth daily.    Historical Provider, MD  clopidogrel (PLAVIX) 75 MG tablet Take 75 mg by mouth daily.    Historical Provider, MD  vitamin B-12 (CYANOCOBALAMIN) 1000 MCG tablet Take 1,000 mcg by mouth daily.    Historical Provider, MD      VITAL SIGNS:  Blood pressure 123/77, pulse 69, temperature 98.9 F (37.2 C), temperature source Oral, resp. rate 20, weight 70.308 kg (155 lb), SpO2 100 %.  PHYSICAL EXAMINATION:  GENERAL:  75 y.o.-year-old patient lying in the bed with no acute distress.  EYES: Pupils equal, round, reactive to light and accommodation. No scleral icterus. Extraocular muscles intact.  HEENT: Head atraumatic, normocephalic. Oropharynx and nasopharynx clear.  NECK:  Supple, no jugular venous distention. No thyroid enlargement, no tenderness.  LUNGS: Normal breath sounds bilaterally, no wheezing, rales,rhonchi or crepitation. No use of accessory muscles of respiration.  CARDIOVASCULAR: S1, S2 normal. No murmurs, rubs, or gallops.  ABDOMEN:  Soft, nontender, nondistended. Bowel sounds present. No organomegaly or mass.  EXTREMITIES: No pedal edema, cyanosis, or clubbing.  NEUROLOGIC: Cranial nerves II through XII are intact. Muscle strength 3-4/5 in all extremities except right arm. Her right arm is rigid. Sensation intact. Gait not checked.  PSYCHIATRIC: The patient is demented and oriented x 2.  SKIN: No obvious rash, lesion, or ulcer.   LABORATORY PANEL:   CBC  Recent Labs Lab 01/01/16 1050  WBC 10.1  HGB 10.3*  HCT 30.5*  PLT 47*    ------------------------------------------------------------------------------------------------------------------  Chemistries   Recent Labs Lab 01/01/16 1050  NA 138  K 2.8*  CL 103  CO2 28  GLUCOSE 90  BUN 14  CREATININE 1.26*  CALCIUM 8.2*  AST 32  ALT 15  ALKPHOS 72  BILITOT 1.8*   ------------------------------------------------------------------------------------------------------------------  Cardiac Enzymes  Recent Labs Lab 01/01/16 1050  TROPONINI 0.28*   ------------------------------------------------------------------------------------------------------------------  RADIOLOGY:  Dg Chest 2 View  01/01/2016  CLINICAL DATA:  Fall. Takes Plavix. History of hypertension, stroke and dementia EXAM: CHEST  2 VIEW COMPARISON:  12/01/2013 FINDINGS: Mild cardiac enlargement. Lung volumes are low. There are coarsened interstitial markings identified bilaterally. No superimposed airspace consolidation noted. IMPRESSION: 1. Chronic interstitial coarsening. 2. Mild cardiac enlargement. Electronically Signed   By: Signa Kell M.D.   On: 01/01/2016 12:19   Ct Head Wo Contrast  01/01/2016  CLINICAL DATA:  Status post fall yesterday with a possible blow to the head. The patient is anticoagulated. Vomiting beginning this morning. Initial encounter. EXAM: CT HEAD WITHOUT CONTRAST CT CERVICAL SPINE WITHOUT CONTRAST TECHNIQUE: Multidetector CT imaging of the head and cervical spine was performed following the standard protocol without intravenous contrast. Multiplanar CT image reconstructions of the cervical spine were also generated. COMPARISON:  Head CT scan 11/17/2014. FINDINGS: CT HEAD FINDINGS There is cortical atrophy, chronic microvascular ischemic change and remote bilateral MCA territory infarcts. Remote right PCA territory infarct also noted. No evidence of acute intracranial abnormality including hemorrhage, infarct, mass lesion, mass effect, midline shift or abnormal  extra-axial fluid collection is identified. Mild mucosal thickening and a polyp are seen in the left sphenoid sinus. The calvarium is intact. CT CERVICAL SPINE FINDINGS No cervical spine fracture is identified. Trace anterolisthesis C3 on C4 and C5 on C6 due to facet arthropathy is noted. Loss of disc space height is most notable at C4-5. Lung apices demonstrate some emphysematous disease but are clear. The thyroid gland is somewhat enlarged and heterogeneous. IMPRESSION: No acute abnormality head or cervical spine. Atrophy, chronic microvascular ischemic change and remote infarcts. Cervical spondylosis. Emphysema. Electronically Signed   By: Drusilla Kanner M.D.   On: 01/01/2016 10:56   Ct Cervical Spine Wo Contrast  01/01/2016  CLINICAL DATA:  Status post fall yesterday with a possible blow to the head. The patient is anticoagulated. Vomiting beginning this morning. Initial encounter. EXAM: CT HEAD WITHOUT CONTRAST CT CERVICAL SPINE WITHOUT CONTRAST TECHNIQUE: Multidetector CT imaging of the head and cervical spine was performed following the standard protocol without intravenous contrast. Multiplanar CT image reconstructions of the cervical spine were also generated. COMPARISON:  Head CT scan 11/17/2014. FINDINGS: CT HEAD FINDINGS There is cortical atrophy, chronic microvascular ischemic change and remote bilateral MCA territory infarcts. Remote right PCA territory infarct also noted. No evidence of acute intracranial abnormality including hemorrhage, infarct, mass lesion, mass effect, midline shift or abnormal extra-axial fluid collection is identified. Mild mucosal thickening and a polyp are seen in the left sphenoid sinus. The calvarium  is intact. CT CERVICAL SPINE FINDINGS No cervical spine fracture is identified. Trace anterolisthesis C3 on C4 and C5 on C6 due to facet arthropathy is noted. Loss of disc space height is most notable at C4-5. Lung apices demonstrate some emphysematous disease but are  clear. The thyroid gland is somewhat enlarged and heterogeneous. IMPRESSION: No acute abnormality head or cervical spine. Atrophy, chronic microvascular ischemic change and remote infarcts. Cervical spondylosis. Emphysema. Electronically Signed   By: Drusilla Kanner M.D.   On: 01/01/2016 10:56   Ct Abdomen Pelvis W Contrast  01/01/2016  CLINICAL DATA:  Fall yesterday on right side, right upper quadrant bruising EXAM: CT ABDOMEN AND PELVIS WITH CONTRAST TECHNIQUE: Multidetector CT imaging of the abdomen and pelvis was performed using the standard protocol following bolus administration of intravenous contrast. CONTRAST:  75mL OMNIPAQUE IOHEXOL 300 MG/ML  SOLN COMPARISON:  None. FINDINGS: Lung bases shows mild emphysematous changes left base posteriorly. No lower rib fractures are noted. Sagittal images of the spine shows diffuse osteopenia. No acute fracture is noted. There is mild about 5 mm anterolisthesis L4 on L5 vertebral body. There are artifacts from patient's right hand overlying in upper right abdomen Mild distended gallbladder. Small layering calcified gallstones are noted within gallbladder. There is a calcified gallstone in gallbladder neck region measures about 3 mm. No definite pericholecystic fluid is noted. No intrahepatic biliary ductal dilatation. No liver laceration is noted. Atherosclerotic calcifications are noted abdominal aorta, celiac trunk and SMA origin, bilateral renal artery origin and bilateral common iliac arteries. There is no evidence of aortic aneurysm. The pancreas, spleen and adrenal glands are unremarkable. There is no evidence of splenic laceration. Bilateral kidneys shows a lobulated contour. There is a cyst in midpole of the right kidney measures 1 cm. There is a cyst in mid to upper pole of the left kidney laterally measures 7 mm. In axial image 19 there is small area of decreased attenuation medial cortex upper pole of the right kidney. This most likely is due to cortical  scarring rather than focal pyelonephritis. There is no ascites or free air. No adenopathy. No small bowel obstruction. Moderate stool noted in right colon and cecum. No pericecal inflammation. Some colonic stool noted in transverse colon. Normal appendix is noted in axial image 51. No pelvic fractures are noted. Degenerative changes are noted bilateral SI joints. No sacral fracture is noted on sagittal images. There is no evidence of inguinal adenopathy. No evidence of urinary bladder injury. The urinary bladder is under distended. Nonspecific mild thickening of urinary bladder wall. The uterus is atrophic anteflexed. No adnexal mass. Moderate stool and gas noted within rectum. Coronal images shows no hip fractures. Mild degenerative changes bilateral hip joints. Delayed renal images shows bilateral renal symmetrical excretion. Bilateral visualized proximal ureter is unremarkable. No hydronephrosis or hydroureter. IMPRESSION: 1. No acute fractures are noted. There is mild about 5 mm anterolisthesis L4 on L5 vertebral body. 2. No evidence of liver laceration. Mild distended gallbladder with small layering calcified gallstones. No definite evidence of pericholecystic fluid. A small calcified gallstone is noted in gallbladder neck region measures 3 mm. 3. Extensive atherosclerotic vascular calcifications 4. Bilateral renal cysts. Small focal area of decreased attenuation upper medial cortex right kidney most likely due to scarring rather than focal pyelonephritis. No evidence of renal laceration. No hydronephrosis or hydroureter. 5. Moderate stool noted in right colon and cecum. No pericecal inflammation. Normal appendix. 6. No pelvic fractures are noted. Degenerative changes bilateral SI joints. 7. No  small bowel obstruction. 8. Atrophic uterus. Nonspecific mild thickening of the wall of under distended urinary bladder. Electronically Signed   By: Natasha Mead M.D.   On: 01/01/2016 11:09    EKG:   Orders placed or  performed during the hospital encounter of 01/01/16  . ED EKG  . ED EKG  . EKG 12-Lead  . EKG 12-Lead    IMPRESSION AND PLAN:    UTI.   the patient will be placed for observation. Start Rocephin and follow-up urine culture and CBC.   fall and Weakness Fall precaution an physical therpy evaluation.  Hypokalemia. Give potasium supplement and check magnesium level.  Elevated troponin.  follow-up troponin and cariology Consult.   throbombocytopenia.  Repeat CBC.   elevated bilirubin.  No abnormal  Abdominal findings per CAT scan.  Follow-up CMP.   hypertension. Continue Norasc.   History of CVA.  continue aspirin, Plavix and Lipitor.   Dementia.  aspiration and fall precaution.     All the records are reviewed and case discussed with ED provider. Management plans discussed with the patient, her son and daughter and they are in agreement.  CODE STATUS: full code  TOTAL TIME TAKING CARE OF THIS PATIENT: 56 minutes.    Shaune Pollack M.D on 01/01/2016 at 1:38 PM  Between 7am to 6pm - Pager - [phone]  After 6pm go to [email or web address] - password [email or web address] [facility]  [place]  [facility]  Office  [phone]  CC: Primary care physician; Dorothey Baseman, MD

## 2016-01-01 NOTE — Consult Note (Signed)
Reason for Consult: Elevated troponin Referring Physician: Dr. Bridgett Larsson hospitalist, Dr. Stefanie Libel Laura Levy is an 75 y.o. female.  HPI: History of hypertension hyperlipidemia stroke presents after a fall yesterday and weakness, patient denied fever chills or sweats was found to have a UTI on evaluation in emergency room she was also found to be hypokalemic with a reading of 2.8. Denied any chest pain she laid on the floor for about 4 hours last night patient's been unable to walk because of weakness.  Past Medical History  Diagnosis Date  . Hypertension   . Stroke ([place])   . Dementia   . Edema leg     Past Surgical History  Procedure Laterality Date  . Cataract extraction w/phaco Right 03/27/2015    Procedure: CATARACT EXTRACTION PHACO AND INTRAOCULAR LENS PLACEMENT (IOC);  Surgeon: Birder Robson, MD;  Location: [facility] ORS;  Service: Ophthalmology;  Laterality: Right;  [place] 01:00 AP%28.9 CDE 17.42  . Cataract extraction w/phaco Left 05/29/2015    Procedure: CATARACT EXTRACTION PHACO AND INTRAOCULAR LENS PLACEMENT (IOC);  Surgeon: Birder Robson, MD;  Location: [facility] ORS;  Service: Ophthalmology;  Laterality: Left;  cassette lot# [id number]    [place] 00:27.7  AP 22.3 CDE  6.17    Family History  Problem Relation Age of Onset  . Congestive Heart Failure Mother     Social History:  reports that she has quit smoking. She does not have any smokeless tobacco history on file. She reports that she does not drink alcohol. Her drug history is not on file.  Allergies: No Known Allergies  Medications: I have reviewed the patient's current medications.  Results for orders placed or performed during the hospital encounter of 01/01/16 (from the past 48 hour(s))  Comprehensive metabolic panel     Status: Abnormal   Collection Time: 01/01/16 10:50 AM  Result Value Ref Range   Sodium 138 135 - 145 mmol/L   Potassium 2.8 (LL) 3.5 - 5.1 mmol/L    Comment: CRITICAL RESULT CALLED TO, READ BACK BY AND  VERIFIED WITH ANNA HOLT AT 1156 ON 01/01/16.Marland KitchenMarland KitchenMethod[facility]    Chloride 103 101 - 111 mmol/L   CO2 28 22 - 32 mmol/L   Glucose, Bld 90 65 - 99 mg/dL   BUN 14 6 - 20 mg/dL   Creatinine, Ser 1.26 (H) 0.44 - 1.00 mg/dL   Calcium 8.2 (L) 8.9 - 10.3 mg/dL   Total Protein 7.2 6.5 - 8.1 g/dL   Albumin 3.2 (L) 3.5 - 5.0 g/dL   AST 32 15 - 41 U/L   ALT 15 14 - 54 U/L   Alkaline Phosphatase 72 38 - 126 U/L   Total Bilirubin 1.8 (H) 0.3 - 1.2 mg/dL   GFR calc non Af Amer 42 (L) >60 mL/min   GFR calc Af Amer 48 (L) >60 mL/min    Comment: (NOTE) The eGFR has been calculated using the CKD EPI equation. This calculation has not been validated in all clinical situations. eGFR's persistently <60 mL/min signify possible Chronic Kidney Disease.    Anion gap 7 5 - 15  Troponin I     Status: Abnormal   Collection Time: 01/01/16 10:50 AM  Result Value Ref Range   Troponin I 0.28 (H) <0.031 ng/mL    Comment: READ BACK AND VERIFIED WITH ANNA HOLT AT 9767 ON 01/01/16.Marland KitchenMarland KitchenMorada        PERSISTENTLY INCREASED TROPONIN VALUES IN THE RANGE OF 0.04-0.49 ng/mL CAN BE SEEN IN:       -UNSTABLE  [place]       -CONGESTIVE HEART FAILURE       -MYOCARDITIS       -CHEST TRAUMA       -ARRYHTHMIAS       -LATE PRESENTING MYOCARDIAL INFARCTION       -COPD   CLINICAL FOLLOW-UP RECOMMENDED.   Lactic acid, plasma     Status: None   Collection Time: 01/01/16 10:50 AM  Result Value Ref Range   Lactic Acid, Venous 1.2 0.5 - 2.0 mmol/L  CBC with Differential     Status: Abnormal   Collection Time: 01/01/16 10:50 AM  Result Value Ref Range   WBC 10.1 3.6 - 11.0 K/uL   RBC 3.58 (L) 3.80 - 5.20 MIL/uL   Hemoglobin 10.3 (L) 12.0 - 16.0 g/dL   HCT 30.5 (L) 35.0 - 47.0 %   MCV 85.2 80.0 - 100.0 fL   MCH 28.8 26.0 - 34.0 pg   MCHC 33.8 32.0 - 36.0 g/dL   RDW 15.1 (H) 11.5 - 14.5 %   Platelets 47 (L) 150 - 440 K/uL    Comment: PLATELET COUNT CONFIRMED BY SMEAR   Neutrophils Relative % 81 %   Neutro Abs 8.2 (H) 1.4 - 6.5 K/uL    Lymphocytes Relative 10 %   Lymphs Abs 1.0 1.0 - 3.6 K/uL   Monocytes Relative 7 %   Monocytes Absolute 0.7 0.2 - 0.9 K/uL   Eosinophils Relative 2 %   Eosinophils Absolute 0.2 0 - 0.7 K/uL   Basophils Absolute 0.0 0 - 0.1 K/uL  CK     Status: None   Collection Time: 01/01/16 10:50 AM  Result Value Ref Range   Total CK 131 38 - 234 U/L  Brain natriuretic peptide     Status: Abnormal   Collection Time: 01/01/16 10:50 AM  Result Value Ref Range   B Natriuretic Peptide 166.0 (H) 0.0 - 100.0 pg/mL  Urinalysis complete, with microscopic     Status: Abnormal   Collection Time: 01/01/16 10:51 AM  Result Value Ref Range   Color, Urine YELLOW (A) YELLOW   APPearance CLEAR (A) CLEAR   Glucose, UA NEGATIVE NEGATIVE mg/dL   Bilirubin Urine NEGATIVE NEGATIVE   Ketones, ur NEGATIVE NEGATIVE mg/dL   Specific Gravity, Urine 1.038 (H) 1.005 - 1.030   Hgb urine dipstick 2+ (A) NEGATIVE   pH 6.0 5.0 - 8.0   Protein, ur 30 (A) NEGATIVE mg/dL   Nitrite NEGATIVE NEGATIVE   Leukocytes, UA 1+ (A) NEGATIVE   RBC / HPF 0-5 0 - 5 RBC/hpf   WBC, UA TOO NUMEROUS TO COUNT 0 - 5 WBC/hpf   Bacteria, UA RARE (A) NONE SEEN   Squamous Epithelial / LPF 0-5 (A) NONE SEEN   Mucous PRESENT    Hyaline Casts, UA PRESENT     Dg Chest 2 View  01/01/2016  CLINICAL DATA:  Fall. Takes Plavix. History of hypertension, stroke and dementia EXAM: CHEST  2 VIEW COMPARISON:  12/01/2013 FINDINGS: Mild cardiac enlargement. Lung volumes are low. There are coarsened interstitial markings identified bilaterally. No superimposed airspace consolidation noted. IMPRESSION: 1. Chronic interstitial coarsening. 2. Mild cardiac enlargement. Electronically Signed   By: Kerby Moors M.D.   On: 01/01/2016 12:19   Ct Head Wo Contrast  01/01/2016  CLINICAL DATA:  Status post fall yesterday with a possible blow to the head. The patient is anticoagulated. Vomiting beginning this morning. Initial encounter. EXAM: CT HEAD WITHOUT CONTRAST CT  CERVICAL SPINE WITHOUT CONTRAST TECHNIQUE:  Multidetector CT imaging of the head and cervical spine was performed following the standard protocol without intravenous contrast. Multiplanar CT image reconstructions of the cervical spine were also generated. COMPARISON:  Head CT scan 11/17/2014. FINDINGS: CT HEAD FINDINGS There is cortical atrophy, chronic microvascular ischemic change and remote bilateral MCA territory infarcts. Remote right PCA territory infarct also noted. No evidence of acute intracranial abnormality including hemorrhage, infarct, mass lesion, mass effect, midline shift or abnormal extra-axial fluid collection is identified. Mild mucosal thickening and a polyp are seen in the left sphenoid sinus. The calvarium is intact. CT CERVICAL SPINE FINDINGS No cervical spine fracture is identified. Trace anterolisthesis C3 on C4 and C5 on C6 due to facet arthropathy is noted. Loss of disc space height is most notable at C4-5. Lung apices demonstrate some emphysematous disease but are clear. The thyroid gland is somewhat enlarged and heterogeneous. IMPRESSION: No acute abnormality head or cervical spine. Atrophy, chronic microvascular ischemic change and remote infarcts. Cervical spondylosis. Emphysema. Electronically Signed   By: Inge Rise M.D.   On: 01/01/2016 10:56   Ct Cervical Spine Wo Contrast  01/01/2016  CLINICAL DATA:  Status post fall yesterday with a possible blow to the head. The patient is anticoagulated. Vomiting beginning this morning. Initial encounter. EXAM: CT HEAD WITHOUT CONTRAST CT CERVICAL SPINE WITHOUT CONTRAST TECHNIQUE: Multidetector CT imaging of the head and cervical spine was performed following the standard protocol without intravenous contrast. Multiplanar CT image reconstructions of the cervical spine were also generated. COMPARISON:  Head CT scan 11/17/2014. FINDINGS: CT HEAD FINDINGS There is cortical atrophy, chronic microvascular ischemic change and remote bilateral  MCA territory infarcts. Remote right PCA territory infarct also noted. No evidence of acute intracranial abnormality including hemorrhage, infarct, mass lesion, mass effect, midline shift or abnormal extra-axial fluid collection is identified. Mild mucosal thickening and a polyp are seen in the left sphenoid sinus. The calvarium is intact. CT CERVICAL SPINE FINDINGS No cervical spine fracture is identified. Trace anterolisthesis C3 on C4 and C5 on C6 due to facet arthropathy is noted. Loss of disc space height is most notable at C4-5. Lung apices demonstrate some emphysematous disease but are clear. The thyroid gland is somewhat enlarged and heterogeneous. IMPRESSION: No acute abnormality head or cervical spine. Atrophy, chronic microvascular ischemic change and remote infarcts. Cervical spondylosis. Emphysema. Electronically Signed   By: Inge Rise M.D.   On: 01/01/2016 10:56   Ct Abdomen Pelvis W Contrast  01/01/2016  CLINICAL DATA:  Fall yesterday on right side, right upper quadrant bruising EXAM: CT ABDOMEN AND PELVIS WITH CONTRAST TECHNIQUE: Multidetector CT imaging of the abdomen and pelvis was performed using the standard protocol following bolus administration of intravenous contrast. CONTRAST:  51m OMNIPAQUE IOHEXOL 300 MG/ML  SOLN COMPARISON:  None. FINDINGS: Lung bases shows mild emphysematous changes left base posteriorly. No lower rib fractures are noted. Sagittal images of the spine shows diffuse osteopenia. No acute fracture is noted. There is mild about 5 mm anterolisthesis L4 on L5 vertebral body. There are artifacts from patient's right hand overlying in upper right abdomen Mild distended gallbladder. Small layering calcified gallstones are noted within gallbladder. There is a calcified gallstone in gallbladder neck region measures about 3 mm. No definite pericholecystic fluid is noted. No intrahepatic biliary ductal dilatation. No liver laceration is noted. Atherosclerotic calcifications  are noted abdominal aorta, celiac trunk and SMA origin, bilateral renal artery origin and bilateral common iliac arteries. There is no evidence of aortic aneurysm. The pancreas, spleen  and adrenal glands are unremarkable. There is no evidence of splenic laceration. Bilateral kidneys shows a lobulated contour. There is a cyst in midpole of the right kidney measures 1 cm. There is a cyst in mid to upper pole of the left kidney laterally measures 7 mm. In axial image 19 there is small area of decreased attenuation medial cortex upper pole of the right kidney. This most likely is due to cortical scarring rather than focal pyelonephritis. There is no ascites or free air. No adenopathy. No small bowel obstruction. Moderate stool noted in right colon and cecum. No pericecal inflammation. Some colonic stool noted in transverse colon. Normal appendix is noted in axial image 51. No pelvic fractures are noted. Degenerative changes are noted bilateral SI joints. No sacral fracture is noted on sagittal images. There is no evidence of inguinal adenopathy. No evidence of urinary bladder injury. The urinary bladder is under distended. Nonspecific mild thickening of urinary bladder wall. The uterus is atrophic anteflexed. No adnexal mass. Moderate stool and gas noted within rectum. Coronal images shows no hip fractures. Mild degenerative changes bilateral hip joints. Delayed renal images shows bilateral renal symmetrical excretion. Bilateral visualized proximal ureter is unremarkable. No hydronephrosis or hydroureter. IMPRESSION: 1. No acute fractures are noted. There is mild about 5 mm anterolisthesis L4 on L5 vertebral body. 2. No evidence of liver laceration. Mild distended gallbladder with small layering calcified gallstones. No definite evidence of pericholecystic fluid. A small calcified gallstone is noted in gallbladder neck region measures 3 mm. 3. Extensive atherosclerotic vascular calcifications 4. Bilateral renal cysts.  Small focal area of decreased attenuation upper medial cortex right kidney most likely due to scarring rather than focal pyelonephritis. No evidence of renal laceration. No hydronephrosis or hydroureter. 5. Moderate stool noted in right colon and cecum. No pericecal inflammation. Normal appendix. 6. No pelvic fractures are noted. Degenerative changes bilateral SI joints. 7. No small bowel obstruction. 8. Atrophic uterus. Nonspecific mild thickening of the wall of under distended urinary bladder. Electronically Signed   By: Lahoma Crocker M.D.   On: 01/01/2016 11:09    Review of Systems  Constitutional: Positive for malaise/fatigue.  HENT: Negative.   Eyes: Negative.   Respiratory: Negative.   Cardiovascular: Negative.   Gastrointestinal: Negative.   Genitourinary: Negative.   Musculoskeletal: Positive for myalgias, back pain and falls.  Skin: Negative.   Neurological: Positive for weakness.  Endo/Heme/Allergies: Negative.   Psychiatric/Behavioral: Negative.    Blood pressure 142/70, pulse 108, temperature 99.5 F (37.5 C), temperature source Axillary, resp. rate 18, height _0  (1.651 m), weight 66.906 kg (147 lb 8 oz), SpO2 95 %. Physical Exam  Nursing note and vitals reviewed. Constitutional: She is oriented to person, place, and time. She appears well-developed and well-nourished.  HENT:  Head: Normocephalic and atraumatic.  Eyes: Conjunctivae and EOM are normal. Pupils are equal, round, and reactive to light.  Neck: Normal range of motion. Neck supple.  Cardiovascular: Normal rate and regular rhythm.   Respiratory: Effort normal.  GI: Soft. Bowel sounds are normal.  Musculoskeletal: Normal range of motion.  Neurological: She is alert and oriented to person, place, and time. She has normal reflexes. She displays atrophy. She exhibits abnormal muscle tone. Coordination and gait abnormal.  Mild aphagia  Skin: Skin is warm and dry.  Psychiatric: She has a normal mood and affect.     Assessment/Plan: Elevated troponin History of TIA Hypokalemia Weakness Falls Hypertension History of CVA COPD Hyperlipidemia  . PLAN Agree with  admission to telemetry Follow-up troponins Mild hydration Agree with antibiotic therapy for UTI Inhalers for COPD Continue Plavix and aspirin for CVA Lipid management for hyperlipidemia Hypertension control with amlodipine Replace potassium Consider physical therapy    Brittny Spangle D. 01/01/2016, 4:34 PM

## 2016-01-01 NOTE — Progress Notes (Signed)
Patient admitted to floor, disoriented to time. Family at bedside. Patient on ra, no distress noted. Skin verified by second nurse mw. Telemetry box called and verified with CCMD. Iv fluids infusing. Bed alarm in use. Will continue to monitor closely

## 2016-01-01 NOTE — Care Management (Signed)
Patient presented to the ED from home.  She was found on the floor at home.  Lives with her family but they work during the day.  She ws probably on the floor several hours before she was found.  PT consult is pending.  Patient placed in observation for fall and weakness. Found to have potassium of 2.8 and uti.

## 2016-01-01 NOTE — ED Notes (Signed)
Admitting MD in room to assess patient at this time.  Will continue to monitor.   

## 2016-01-01 NOTE — Progress Notes (Signed)
Spoke with dr. Imogene Burn to make aware second troponin was 0.44. First troponin 0.28. md made aware Patient does have a cardiology consult ordered. Per md no new orders at this time. Continue to monitor

## 2016-01-01 NOTE — ED Provider Notes (Signed)
[facility] Emergency Department Provider Note  ____________________________________________  Time seen: Approximately 10:29 AM  I have reviewed the triage vital signs and the nursing notes.   HISTORY  Chief Complaint Fall and Emesis    HPI Laura Levy is a 75 y.o. female who yesterday was found on the floor by her family. She lives with them but they go to work and her leave her alone during the day. She was apparently on the floor for about 3 or 4 hours at home. Today she is very weak and does not want to eat as much as usual and was unable to walk by herself. In the emergency room she says she feels okay but was unable to stand by herself. In fact when I got her standing if I let go of her she immediately fell back towards the bed. I attempted to stand her up 3 times by myself and each time when I let go of her she began falling back towards the bed. Patient denies any fever chills nausea vomiting abdominal pain chest pain etc.  Past Medical History  Diagnosis Date  . Hypertension   . Stroke ([facility])   . Dementia   . Edema leg     There are no active problems to display for this patient.   Past Surgical History  Procedure Laterality Date  . Cataract extraction w/phaco Right 03/27/2015    Procedure: CATARACT EXTRACTION PHACO AND INTRAOCULAR LENS PLACEMENT (IOC);  Surgeon: Galen Manila, MD;  Location: [facility] ORS;  Service: Ophthalmology;  Laterality: Right;  [place] 01:00 AP%28.9 CDE 17.42  . Cataract extraction w/phaco Left 05/29/2015    Procedure: CATARACT EXTRACTION PHACO AND INTRAOCULAR LENS PLACEMENT (IOC);  Surgeon: Galen Manila, MD;  Location: [facility] ORS;  Service: Ophthalmology;  Laterality: Left;  cassette lot# [id number]    [place] 00:27.7  AP 22.3 CDE  6.17    Current Outpatient Rx  Name  Route  Sig  Dispense  Refill  . amLODipine (NORVASC) 10 MG tablet   Oral   Take 10 mg by mouth daily.         Marland Kitchen aspirin EC 81 MG tablet   Oral   Take 81 mg by  mouth daily.         Marland Kitchen atorvastatin (LIPITOR) 80 MG tablet   Oral   Take 80 mg by mouth daily.         . clopidogrel (PLAVIX) 75 MG tablet   Oral   Take 75 mg by mouth daily.         . vitamin B-12 (CYANOCOBALAMIN) 1000 MCG tablet   Oral   Take 1,000 mcg by mouth daily.           Allergies Review of patient's allergies indicates no known allergies.  No family history on file.  Social History Social History  Substance Use Topics  . Smoking status: Former Games developer  . Smokeless tobacco: None  . Alcohol Use: No    Review of Systems Constitutional: No fever/chills Eyes: No visual changes. ENT: No sore throat. Cardiovascular: Denies chest pain. Respiratory: Denies shortness of breath. Gastrointestinal: No abdominal pain.  No nausea, no vomiting.  No diarrhea.  No constipation. Genitourinary: Negative for dysuria. Musculoskeletal: Negative for back pain. Skin: Negative for rash. Neurological: Negative for headaches, focal weakness or numbness.  10-point ROS otherwise negative.  ____________________________________________   PHYSICAL EXAM:  VITAL SIGNS: ED Triage Vitals  Enc Vitals Group     BP 01/01/16 0931 128/68 mmHg  Pulse Rate 01/01/16 0931 73     Resp 01/01/16 0931 20     Temp 01/01/16 0931 98.9 F (37.2 C)     Temp Source 01/01/16 0931 Oral     SpO2 01/01/16 0931 95 %     Weight 01/01/16 0931 155 lb (70.308 kg)     Height --      Head Cir --      Peak Flow --      Pain Score --      Pain Loc --      Pain Edu? --      Excl. in GC? --     Constitutional: Alert and oriented. Well appearing and in no acute distress. Eyes: Conjunctivae are normal. PERRL. EOMI. Head: Atraumatic. Nose: No congestion/rhinnorhea. Mouth/Throat: Mucous membranes are moist.  Oropharynx non-erythematous. Neck: No stridor.   Cardiovascular: Normal rate, regular rhythm. Grossly normal heart sounds.  Good peripheral circulation. Respiratory: Normal respiratory  effort.  No retractions. Lungs CTAB. Gastrointestinal: Soft and nontender. No distention. No abdominal bruits. No CVA tenderness. Musculoskeletal: No lower extremity tenderness nor edema.  No joint effusions. Neurologic:  No new changes. Skin:  Skin is warm, dry and intact. No rash noted. .  ____________________________________________   LABS (all labs ordered are listed, but only abnormal results are displayed)  Labs Reviewed  COMPREHENSIVE METABOLIC PANEL - Abnormal; Notable for the following:    Potassium 2.8 (*)    Creatinine, Ser 1.26 (*)    Calcium 8.2 (*)    Albumin 3.2 (*)    Total Bilirubin 1.8 (*)    GFR calc non Af Amer 42 (*)    GFR calc Af Amer 48 (*)    All other components within normal limits  TROPONIN I - Abnormal; Notable for the following:    Troponin I 0.28 (*)    All other components within normal limits  CBC WITH DIFFERENTIAL/PLATELET - Abnormal; Notable for the following:    RBC 3.58 (*)    Hemoglobin 10.3 (*)    HCT 30.5 (*)    RDW 15.1 (*)    Platelets 47 (*)    Neutro Abs 8.2 (*)    All other components within normal limits  URINALYSIS COMPLETEWITH MICROSCOPIC ([facility] ONLY) - Abnormal; Notable for the following:    Color, Urine YELLOW (*)    APPearance CLEAR (*)    Specific Gravity, Urine 1.038 (*)    Hgb urine dipstick 2+ (*)    Protein, ur 30 (*)    Leukocytes, UA 1+ (*)    Bacteria, UA RARE (*)    Squamous Epithelial / LPF 0-5 (*)    All other components within normal limits  CK  LACTIC ACID, PLASMA  LACTIC ACID, PLASMA  BRAIN NATRIURETIC PEPTIDE   ____________________________________________  EKG  KG read and interpreted by me shows normal sinus rhythm at a rate of 78 left axis flipped T's in V1 through 3 ____________________________________________  RADIOLOGY  CT of the head and neck show no acute pathology CT the abdomen shows no acute pathology per radiology Chest x-ray read by radiology shows mild cardiac enlargement and  chronic coarse markings. ____________________________________________   PROCEDURES    ____________________________________________   INITIAL IMPRESSION / ASSESSMENT AND PLAN / ED COURSE  Pertinent labs & imaging results that were available during my care of the patient were reviewed by me and considered in my medical decision making (see chart for details).   ____________________________________________   FINAL CLINICAL IMPRESSION(S) / ED DIAGNOSES  Final diagnoses:  Elevated troponin  Urinary tract infection without hematuria, site unspecified  Unable to balance when standing  Weakness      Arnaldo Natal, MD 01/01/16 1258

## 2016-01-02 DIAGNOSIS — G9341 Metabolic encephalopathy: Secondary | ICD-10-CM | POA: Diagnosis not present

## 2016-01-02 LAB — BASIC METABOLIC PANEL
ANION GAP: 5 (ref 5–15)
BUN: 9 mg/dL (ref 6–20)
CO2: 25 mmol/L (ref 22–32)
Calcium: 7.9 mg/dL — ABNORMAL LOW (ref 8.9–10.3)
Chloride: 111 mmol/L (ref 101–111)
Creatinine, Ser: 1.01 mg/dL — ABNORMAL HIGH (ref 0.44–1.00)
GFR, EST NON AFRICAN AMERICAN: 54 mL/min — AB (ref >60)
GLUCOSE: 89 mg/dL (ref 65–99)
POTASSIUM: 5.1 mmol/L (ref 3.5–5.1)
SODIUM: 141 mmol/L (ref 135–145)

## 2016-01-02 LAB — CBC
HEMATOCRIT: 29.2 % — AB (ref 35.0–47.0)
HEMOGLOBIN: 9.9 g/dL — AB (ref 12.0–16.0)
MCH: 28.5 pg (ref 26.0–34.0)
MCHC: 33.9 g/dL (ref 32.0–36.0)
MCV: 84 fL (ref 80.0–100.0)
Platelets: 47 10*3/uL — ABNORMAL LOW (ref 150–440)
RBC: 3.48 MIL/uL — AB (ref 3.80–5.20)
RDW: 15.2 % — ABNORMAL HIGH (ref 11.5–14.5)
WBC: 8.6 10*3/uL (ref 3.6–11.0)

## 2016-01-02 MED ORDER — ENOXAPARIN SODIUM 40 MG/0.4ML ~~LOC~~ SOLN
40.0000 mg | SUBCUTANEOUS | Status: DC
  Administered 2016-01-02: 40 mg via SUBCUTANEOUS
  Filled 2016-01-02: qty 0.4

## 2016-01-02 NOTE — Care Management (Signed)
Spoke with patient and she stated that she had "Mentia", so I can't answer any questions". TC to patient's son, Leane Para. Son lives with patient and is her care taker. Patient has PCS services 2 hours per day/7 days per week through [facility]. Son is working on getting her hours increased so she is alone less time. She currently is alone approximately 6 hours per day. He is also getting FMLA for himself so he can assist more with her care. Son denies issues with obtaining medications, copays or medical care. She is current with Dr. Terance Hart. Son will be at hospital in a couple of hours and I will follow up with him at his request.

## 2016-01-02 NOTE — Progress Notes (Signed)
NSR. Room air. Aler to self only. High fall risk. Incontinent. CVA to R side. Pt has no further concerns at this time.

## 2016-01-02 NOTE — Progress Notes (Signed)
PT Cancellation Note  Patient Details Name: Laura Levy MRN: [id number] DOB: 28-Sep-1941   Cancelled Treatment:    Reason Eval/Treat Not Completed: Medical issues which prohibited therapy;Other (comment). Pt has been chart reviewed. Her troponin level is currently at 0.47, which is an increase from the previous reading, and is inappropriate to participate in therapy. She has a cardiology consult pending. PT will f/u and complete evaluation with pt is medically appropriate.   Adelene Idler, PT, DPT  01/02/2016, 11:12 AM [phone]

## 2016-01-02 NOTE — Progress Notes (Signed)
[facility] - [place] [facility]   PATIENT NAME: Laura Levy    MR#:  [id number]  DATE OF BIRTH:  06/30/1941  SUBJECTIVE:  Admitted yesterday with weakness and urinary tract infection Complains of some weakness today but no frank complaints  REVIEW OF SYSTEMS:  CONSTITUTIONAL: No fever, fatigue or weakness.  EYES: No blurred or double vision.  EARS, NOSE, AND THROAT: No tinnitus or ear pain.  RESPIRATORY: No cough, shortness of breath, wheezing or hemoptysis.  CARDIOVASCULAR: No chest pain, orthopnea, edema.  GASTROINTESTINAL: No nausea, vomiting, diarrhea or abdominal pain.  GENITOURINARY: No dysuria, hematuria.  ENDOCRINE: No polyuria, nocturia,  HEMATOLOGY: No anemia, easy bruising or bleeding SKIN: No rash or lesion. MUSCULOSKELETAL: No joint pain or arthritis.   NEUROLOGIC: No tingling, numbness, weakness.  PSYCHIATRY: No anxiety or depression.   DRUG ALLERGIES:  No Known Allergies  VITALS:  Blood pressure 118/56, pulse 70, temperature 98.1 F (36.7 C), temperature source Axillary, resp. rate 18, height 5\' 5"  (1.651 m), weight 66.906 kg (147 lb 8 oz), SpO2 96 %.  PHYSICAL EXAMINATION:  VITAL SIGNS: Filed Vitals:   01/02/16 0448 01/02/16 1229  BP: 124/64 118/56  Pulse: 78 70  Temp: 97.8 F (36.6 C) 98.1 F (36.7 C)  Resp: 18 18   GENERAL:75 y.o.female currently in no acute distress.  HEAD: Normocephalic, atraumatic.  EYES: Pupils equal, round, reactive to light. Extraocular muscles intact. No scleral icterus.  MOUTH: Moist mucosal membrane. Dentition intact. No abscess noted.  EAR, NOSE, THROAT: Clear without exudates. No external lesions.  NECK: Supple. No thyromegaly. No nodules. No JVD.  PULMONARY: Clear to ascultation, without wheeze rails or rhonci. No use of accessory muscles, Good respiratory effort. good air entry bilaterally CHEST: Nontender to palpation.  CARDIOVASCULAR: S1 and S2. Regular rate and rhythm. No murmurs, rubs,  or gallops. No edema. Pedal pulses 2+ bilaterally.  GASTROINTESTINAL: Soft, nontender, nondistended. No masses. Positive bowel sounds. No hepatosplenomegaly.  MUSCULOSKELETAL: No swelling, clubbing, or edema. Range of motion full in all extremities.  NEUROLOGIC: Cranial nerves II through XII are intact. No gross focal neurological deficits. Sensation intact. Reflexes intact.  SKIN: No ulceration, lesions, rashes, or cyanosis. Skin warm and dry. Turgor intact.  PSYCHIATRIC: Mood, affect within normal limits. The patient is awake, alert and oriented x 3. Insight, judgment intact.      LABORATORY PANEL:   CBC  Recent Labs Lab 01/02/16 0514  WBC 8.6  HGB 9.9*  HCT 29.2*  PLT 47*   ------------------------------------------------------------------------------------------------------------------  Chemistries   Recent Labs Lab 01/01/16 1050 01/01/16 1646 01/02/16 0514  NA 138  --  141  K 2.8*  --  5.1  CL 103  --  111  CO2 28  --  25  GLUCOSE 90  --  89  BUN 14  --  9  CREATININE 1.26*  --  1.01*  CALCIUM 8.2*  --  7.9*  MG  --  1.8  --   AST 32  --   --   ALT 15  --   --   ALKPHOS 72  --   --   BILITOT 1.8*  --   --    ------------------------------------------------------------------------------------------------------------------  Cardiac Enzymes  Recent Labs Lab 01/01/16 2256  TROPONINI 0.47*   ------------------------------------------------------------------------------------------------------------------  RADIOLOGY:  Dg Chest 2 View  01/01/2016  CLINICAL DATA:  Fall. Takes Plavix. History of hypertension, stroke and dementia EXAM: CHEST  2 VIEW COMPARISON:  12/01/2013 FINDINGS: Mild cardiac enlargement. Lung volumes  are low. There are coarsened interstitial markings identified bilaterally. No superimposed airspace consolidation noted. IMPRESSION: 1. Chronic interstitial coarsening. 2. Mild cardiac enlargement. Electronically Signed   By: Signa Kell M.D.    On: 01/01/2016 12:19   Ct Head Wo Contrast  01/01/2016  CLINICAL DATA:  Status post fall yesterday with a possible blow to the head. The patient is anticoagulated. Vomiting beginning this morning. Initial encounter. EXAM: CT HEAD WITHOUT CONTRAST CT CERVICAL SPINE WITHOUT CONTRAST TECHNIQUE: Multidetector CT imaging of the head and cervical spine was performed following the standard protocol without intravenous contrast. Multiplanar CT image reconstructions of the cervical spine were also generated. COMPARISON:  Head CT scan 11/17/2014. FINDINGS: CT HEAD FINDINGS There is cortical atrophy, chronic microvascular ischemic change and remote bilateral MCA territory infarcts. Remote right PCA territory infarct also noted. No evidence of acute intracranial abnormality including hemorrhage, infarct, mass lesion, mass effect, midline shift or abnormal extra-axial fluid collection is identified. Mild mucosal thickening and a polyp are seen in the left sphenoid sinus. The calvarium is intact. CT CERVICAL SPINE FINDINGS No cervical spine fracture is identified. Trace anterolisthesis C3 on C4 and C5 on C6 due to facet arthropathy is noted. Loss of disc space height is most notable at C4-5. Lung apices demonstrate some emphysematous disease but are clear. The thyroid gland is somewhat enlarged and heterogeneous. IMPRESSION: No acute abnormality head or cervical spine. Atrophy, chronic microvascular ischemic change and remote infarcts. Cervical spondylosis. Emphysema. Electronically Signed   By: Drusilla Kanner M.D.   On: 01/01/2016 10:56   Ct Cervical Spine Wo Contrast  01/01/2016  CLINICAL DATA:  Status post fall yesterday with a possible blow to the head. The patient is anticoagulated. Vomiting beginning this morning. Initial encounter. EXAM: CT HEAD WITHOUT CONTRAST CT CERVICAL SPINE WITHOUT CONTRAST TECHNIQUE: Multidetector CT imaging of the head and cervical spine was performed following the standard protocol without  intravenous contrast. Multiplanar CT image reconstructions of the cervical spine were also generated. COMPARISON:  Head CT scan 11/17/2014. FINDINGS: CT HEAD FINDINGS There is cortical atrophy, chronic microvascular ischemic change and remote bilateral MCA territory infarcts. Remote right PCA territory infarct also noted. No evidence of acute intracranial abnormality including hemorrhage, infarct, mass lesion, mass effect, midline shift or abnormal extra-axial fluid collection is identified. Mild mucosal thickening and a polyp are seen in the left sphenoid sinus. The calvarium is intact. CT CERVICAL SPINE FINDINGS No cervical spine fracture is identified. Trace anterolisthesis C3 on C4 and C5 on C6 due to facet arthropathy is noted. Loss of disc space height is most notable at C4-5. Lung apices demonstrate some emphysematous disease but are clear. The thyroid gland is somewhat enlarged and heterogeneous. IMPRESSION: No acute abnormality head or cervical spine. Atrophy, chronic microvascular ischemic change and remote infarcts. Cervical spondylosis. Emphysema. Electronically Signed   By: Drusilla Kanner M.D.   On: 01/01/2016 10:56   Ct Abdomen Pelvis W Contrast  01/01/2016  CLINICAL DATA:  Fall yesterday on right side, right upper quadrant bruising EXAM: CT ABDOMEN AND PELVIS WITH CONTRAST TECHNIQUE: Multidetector CT imaging of the abdomen and pelvis was performed using the standard protocol following bolus administration of intravenous contrast. CONTRAST:  75mL OMNIPAQUE IOHEXOL 300 MG/ML  SOLN COMPARISON:  None. FINDINGS: Lung bases shows mild emphysematous changes left base posteriorly. No lower rib fractures are noted. Sagittal images of the spine shows diffuse osteopenia. No acute fracture is noted. There is mild about 5 mm anterolisthesis L4 on L5 vertebral body. There  are artifacts from patient's right hand overlying in upper right abdomen Mild distended gallbladder. Small layering calcified gallstones are  noted within gallbladder. There is a calcified gallstone in gallbladder neck region measures about 3 mm. No definite pericholecystic fluid is noted. No intrahepatic biliary ductal dilatation. No liver laceration is noted. Atherosclerotic calcifications are noted abdominal aorta, celiac trunk and SMA origin, bilateral renal artery origin and bilateral common iliac arteries. There is no evidence of aortic aneurysm. The pancreas, spleen and adrenal glands are unremarkable. There is no evidence of splenic laceration. Bilateral kidneys shows a lobulated contour. There is a cyst in midpole of the right kidney measures 1 cm. There is a cyst in mid to upper pole of the left kidney laterally measures 7 mm. In axial image 19 there is small area of decreased attenuation medial cortex upper pole of the right kidney. This most likely is due to cortical scarring rather than focal pyelonephritis. There is no ascites or free air. No adenopathy. No small bowel obstruction. Moderate stool noted in right colon and cecum. No pericecal inflammation. Some colonic stool noted in transverse colon. Normal appendix is noted in axial image 51. No pelvic fractures are noted. Degenerative changes are noted bilateral SI joints. No sacral fracture is noted on sagittal images. There is no evidence of inguinal adenopathy. No evidence of urinary bladder injury. The urinary bladder is under distended. Nonspecific mild thickening of urinary bladder wall. The uterus is atrophic anteflexed. No adnexal mass. Moderate stool and gas noted within rectum. Coronal images shows no hip fractures. Mild degenerative changes bilateral hip joints. Delayed renal images shows bilateral renal symmetrical excretion. Bilateral visualized proximal ureter is unremarkable. No hydronephrosis or hydroureter. IMPRESSION: 1. No acute fractures are noted. There is mild about 5 mm anterolisthesis L4 on L5 vertebral body. 2. No evidence of liver laceration. Mild distended  gallbladder with small layering calcified gallstones. No definite evidence of pericholecystic fluid. A small calcified gallstone is noted in gallbladder neck region measures 3 mm. 3. Extensive atherosclerotic vascular calcifications 4. Bilateral renal cysts. Small focal area of decreased attenuation upper medial cortex right kidney most likely due to scarring rather than focal pyelonephritis. No evidence of renal laceration. No hydronephrosis or hydroureter. 5. Moderate stool noted in right colon and cecum. No pericecal inflammation. Normal appendix. 6. No pelvic fractures are noted. Degenerative changes bilateral SI joints. 7. No small bowel obstruction. 8. Atrophic uterus. Nonspecific mild thickening of the wall of under distended urinary bladder. Electronically Signed   By: Natasha Mead M.D.   On: 01/01/2016 11:09    EKG:   Orders placed or performed during the hospital encounter of 01/01/16  . ED EKG  . ED EKG  . EKG 12-Lead  . EKG 12-Lead    ASSESSMENT AND PLAN:   75 year old African-American female admitted 01/01/16 weakness fall urinary tract infection  1. Urinary tract infection, site unspecified: Ceftriaxone day #2 follow culture data 2. Hypokalemia: Resolved will take off of IV fluid with potassium 3. Elevated troponin: Stabilized 4. Essential hypertension: Norvasc 5. Hyperlipidemia unspecified: Lipitor 6. Venous thrombi embolism prophylactic: Lovenox  Disposition: Physical therapy, question placement     All the records are reviewed and case discussed with Care Management/Social Workerr. Management plans discussed with the patient, family and they are in agreement.  CODE STATUS: Full  TOTAL TIME TAKING CARE OF THIS PATIENT: 28 minutes.   POSSIBLE D/C IN 1-2 DAYS, DEPENDING ON CLINICAL CONDITION.   Gabrial Poppell,  Mardi Mainland.D on 01/02/2016  at 2:59 PM  Between 7am to 6pm - Pager - [phone]  After 6pm: House Pager: - [phone]  [facility]  Office   [phone]  CC: Primary care physician; Dorothey Baseman, MD

## 2016-01-03 DIAGNOSIS — G9341 Metabolic encephalopathy: Secondary | ICD-10-CM | POA: Diagnosis not present

## 2016-01-03 LAB — URINE CULTURE
Culture: NO GROWTH
Special Requests: NORMAL

## 2016-01-03 LAB — CBC
HCT: 31.7 % — ABNORMAL LOW (ref 35.0–47.0)
HEMOGLOBIN: 10.8 g/dL — AB (ref 12.0–16.0)
MCH: 28.6 pg (ref 26.0–34.0)
MCHC: 34 g/dL (ref 32.0–36.0)
MCV: 84 fL (ref 80.0–100.0)
Platelets: 62 10*3/uL — ABNORMAL LOW (ref 150–440)
RBC: 3.78 MIL/uL — AB (ref 3.80–5.20)
RDW: 15.3 % — ABNORMAL HIGH (ref 11.5–14.5)
WBC: 8.8 10*3/uL (ref 3.6–11.0)

## 2016-01-03 NOTE — Clinical Social Work Note (Signed)
Clinical Social Work Assessment  Patient Details  Name: Laura Levy MRN: [id number] Date of Birth: 29-Dec-1940  Date of referral:  01/03/16               Reason for consult:  Facility Placement, Other (Comment Required) (Patient is Medicare Observation and patient's son Zollie Beckers refused SNF )                Permission sought to share information with:  Facility Industrial/product designer granted to share information::  No  Name::      Skilled Nursing Facility   Agency::     Relationship::     Contact Information:     Housing/Transportation Living arrangements for the past 2 months:  Single Family Home Source of Information:  Adult Children Patient Interpreter Needed:  None Criminal Activity/Legal Involvement Pertinent to Current Situation/Hospitalization:  No - Comment as needed Significant Relationships:  Adult Children Lives with:  Adult Children Do you feel safe going back to the place where you live?  Yes Need for family participation in patient care:  Yes (Comment)  Care giving concerns:  Patient lives in [place] with her daughter Lari Linson.    Social Worker assessment / plan:  Visual merchandiser (CSW) reviewed chart and noted that PT is recommending SNF. Per chart patient is not alert and oriented. CSW contacted patient's son Bonnielee Haff [phone] to complete assessment. Per son patient lives with her daughter French Ana in [place]. Son reported that patient has an Management consultant that comes in a few days a week. Per son he is currently working on arranging 24/7 care for patient because French Ana does work during the day. CSW explained that PT is recommending SNF. CSW also explained that patient is under Medicare observation and Medicare will not pay for SNF. CSW explained that patient can pay privately for SNF or we can do a Medicaid bed search. CSW explained that a Medicaid bed search will have to extend outside of [facility]. Son reported that he did not want to  pursue SNF and would take patient home. CSW made son aware that MD is discharging patient home today. Per son he will pick patient up from [facility]. RN Case Manager and RN aware of above. Please reconsult if future social work needs arise. CSW signing off.    Employment status:  Retired Health and safety inspector:  Medicare (Medicare Observation ) PT Recommendations:  Skilled Nursing Facility Information / Referral to community resources:  Other (Comment Required) (Patient is going home with home health today. )  Patient/Family's Response to care:  Patient's son refused SNF and reported that he would take patient home today.   Patient/Family's Understanding of and Emotional Response to Diagnosis, Current Treatment, and Prognosis:  Son was pleasant and thanked CSW for calling.   Emotional Assessment Appearance:    Attitude/Demeanor/Rapport:  Unable to Assess Affect (typically observed):  Unable to Assess Orientation:  Oriented to Self, Fluctuating Orientation (Suspected and/or reported Sundowners) Alcohol / Substance use:  Not Applicable Psych involvement (Current and /or in the community):  No (Comment)  Discharge Needs  Concerns to be addressed:  No discharge needs identified Readmission within the last 30 days:  No Current discharge risk:  None Barriers to Discharge:  No Barriers Identified   Haig Prophet, LCSW 01/03/2016, 4:02 PM

## 2016-01-03 NOTE — Care Management (Addendum)
Spoke with patient's sonZollie Beckers in person.  He and family members are in agreement with home health nursing, physical therapy and social worker at discharge.  Provided him with the phone number for [employer]- the [employer] care coordinator agency- to reassess patient  for increase in pcs hours.  Zollie Beckers does not want to even discuss other programs- such a [facility].  Patient uses a quad cane for ambulation due to previous stroke with weakness on left side.  Family verbalizes understanding that at present, patient should not be left alone.  Provided Life alert brochure.  Heads up referral to [facility] for SN and PT

## 2016-01-03 NOTE — Progress Notes (Signed)
[facility] - [place] at [facility]   PATIENT NAME: Laura Levy    MR#:  [id number]  DATE OF BIRTH:  07-09-1941  SUBJECTIVE:  No complaints today seems a little confused this morning  REVIEW OF SYSTEMS:  CONSTITUTIONAL: No fever, fatigue or weakness.  EYES: No blurred or double vision.  EARS, NOSE, AND THROAT: No tinnitus or ear pain.  RESPIRATORY: No cough, shortness of breath, wheezing or hemoptysis.  CARDIOVASCULAR: No chest pain, orthopnea, edema.  GASTROINTESTINAL: No nausea, vomiting, diarrhea or abdominal pain.  GENITOURINARY: No dysuria, hematuria.  ENDOCRINE: No polyuria, nocturia,  HEMATOLOGY: No anemia, easy bruising or bleeding SKIN: No rash or lesion. MUSCULOSKELETAL: No joint pain or arthritis.   NEUROLOGIC: No tingling, numbness, weakness.  PSYCHIATRY: No anxiety or depression.   DRUG ALLERGIES:  No Known Allergies  VITALS:  Blood pressure 118/58, pulse 81, temperature 98.6 F (37 C), temperature source Oral, resp. rate 16, height  (1.651 m), weight 147 lb 8 oz (66.906 kg), SpO2 98 %.  PHYSICAL EXAMINATION:  VITAL SIGNS: Filed Vitals:   01/03/16 1127 01/03/16 1420  BP: 118/58   Pulse: 69 81  Temp: 98.6 F (37 C)   Resp: 16    GENERAL:75 y.o.female currently in no acute distress.  HEAD: Normocephalic, atraumatic.  EYES: Pupils equal, round, reactive to light. Extraocular muscles intact. No scleral icterus.  MOUTH: Moist mucosal membrane. Dentition intact. No abscess noted.  EAR, NOSE, THROAT: Clear without exudates. No external lesions.  NECK: Supple. No thyromegaly. No nodules. No JVD.  PULMONARY: Clear to ascultation, without wheeze rails or rhonci. No use of accessory muscles, Good respiratory effort. good air entry bilaterally CHEST: Nontender to palpation.  CARDIOVASCULAR: S1 and S2. Regular rate and rhythm. No murmurs, rubs, or gallops. No edema. Pedal pulses 2+ bilaterally.  GASTROINTESTINAL: Soft, nontender,  nondistended. No masses. Positive bowel sounds. No hepatosplenomegaly.  MUSCULOSKELETAL: No swelling, clubbing, or edema. Range of motion full in all extremities.  NEUROLOGIC: Cranial nerves II through XII are intact. No gross focal neurological deficits. Sensation intact. Reflexes intact.  SKIN: No ulceration, lesions, rashes, or cyanosis. Skin warm and dry. Turgor intact.  PSYCHIATRIC: Mood, affect within normal limits. The patient is awake, alert and oriented x 3. Insight, judgment intact.      LABORATORY PANEL:   CBC  Recent Labs Lab 01/03/16 0617  WBC 8.8  HGB 10.8*  HCT 31.7*  PLT 62*   ------------------------------------------------------------------------------------------------------------------  Chemistries   Recent Labs Lab 01/01/16 1050 01/01/16 1646 01/02/16 0514  NA 138  --  141  K 2.8*  --  5.1  CL 103  --  111  CO2 28  --  25  GLUCOSE 90  --  89  BUN 14  --  9  CREATININE 1.26*  --  1.01*  CALCIUM 8.2*  --  7.9*  MG  --  1.8  --   AST 32  --   --   ALT 15  --   --   ALKPHOS 72  --   --   BILITOT 1.8*  --   --    ------------------------------------------------------------------------------------------------------------------  Cardiac Enzymes  Recent Labs Lab 01/01/16 2256  TROPONINI 0.47*   ------------------------------------------------------------------------------------------------------------------  RADIOLOGY:  No results found.  EKG:   Orders placed or performed during the hospital encounter of 01/01/16  . ED EKG  . ED EKG  . EKG 12-Lead  . EKG 12-Lead    ASSESSMENT AND PLAN:   75 year old African-American female  admitted 01/01/16 weakness fall urinary tract infection  1. Urinary tract infection, site unspecified: Ceftriaxone day #3 /3 2. Hypokalemia: Resolved will take off of IV fluid with potassium 3. Elevated troponin: Stabilized 4. Essential hypertension: Norvasc 5. Hyperlipidemia unspecified: Lipitor 6. Venous  thrombi embolism prophylactic: Lovenox  Disposition: Physical therapy-SNF-discharge tomorrow     All the records are reviewed and case discussed with Care Management/Social Workerr. Management plans discussed with the patient, family and they are in agreement.  CODE STATUS: Full  TOTAL TIME TAKING CARE OF THIS PATIENT: 28 minutes.   POSSIBLE D/C IN 1 DAYS, DEPENDING ON CLINICAL CONDITION.   Elnathan Fulford,  Mardi Mainland.D on 01/03/2016 at 3:29 PM  Between 7am to 6pm - Pager - [phone]  After 6pm: House Pager: - [phone]  [facility]  Office  [phone]  CC: Primary care physician; Dorothey Baseman, MD

## 2016-01-03 NOTE — Progress Notes (Signed)
Son notified of d/c, waiting on arrival. Trudee Kuster

## 2016-01-03 NOTE — Progress Notes (Signed)
Patient d/c'd home. Education provided, no questions at this time. Patient picked up by son. Telemetry removed. Raynette Arras R [place] 

## 2016-01-03 NOTE — Discharge Summary (Signed)
[facility] -  at [facility]   PATIENT NAME: Laura Levy    MR#:  [id number]  DATE OF BIRTH:  Mar 15, 1941  DATE OF ADMISSION:  01/01/2016 ADMITTING PHYSICIAN: Shaune Pollack, MD  DATE OF DISCHARGE: No discharge date for patient encounter.  PRIMARY CARE PHYSICIAN: Dorothey Baseman, MD    ADMISSION DIAGNOSIS:  Weakness [R53.1] Elevated troponin [R79.89] Unable to balance when standing [R27.8] Urinary tract infection without hematuria, site unspecified [N39.0]  DISCHARGE DIAGNOSIS:  Metabolic encephalopathy secondary to urinary tract infection Generalized weakness Elevated troponin   SECONDARY DIAGNOSIS:   Past Medical History  Diagnosis Date  . Hypertension   . Stroke (HCC)   . Dementia   . Edema leg     HOSPITAL COURSE:  Laura Levy  is a 75 y.o. female admitted 01/01/2016 with chief complaint fall/weakness. Please see H&P performed by Dr. Imogene Burn for further information. On admission to the hospital she is known to have elevated cardiac enzymes was positive for urinary tract infection. She started on ceftriaxone for which she completed 3 day course in the hospital, urine cultures actually no growth to date. She was placed on telemetry and evaluated by cardiology clinic enzymes trended downwards no further intervention required.  DISCHARGE CONDITIONS:   Stable  CONSULTS OBTAINED:     DRUG ALLERGIES:  No Known Allergies  DISCHARGE MEDICATIONS:   Current Discharge Medication List    CONTINUE these medications which have NOT CHANGED   Details  amLODipine (NORVASC) 10 MG tablet Take 10 mg by mouth daily.    aspirin EC 81 MG tablet Take 81 mg by mouth daily.    atorvastatin (LIPITOR) 80 MG tablet Take 80 mg by mouth daily.    clopidogrel (PLAVIX) 75 MG tablet Take 75 mg by mouth daily.    vitamin B-12 (CYANOCOBALAMIN) 1000 MCG tablet Take 1,000 mcg by mouth daily.         DISCHARGE INSTRUCTIONS:    DIET:  Regular diet  DISCHARGE  CONDITION:  Good  ACTIVITY:  Activity as tolerated  OXYGEN:  Home Oxygen: No.   Oxygen Delivery: room air  DISCHARGE LOCATION:  home   If you experience worsening of your admission symptoms, develop shortness of breath, life threatening emergency, suicidal or homicidal thoughts you must seek medical attention immediately by calling 911 or calling your MD immediately  if symptoms less severe.  You Must read complete instructions/literature along with all the possible adverse reactions/side effects for all the Medicines you take and that have been prescribed to you. Take any new Medicines after you have completely understood and accpet all the possible adverse reactions/side effects.   Please note  You were cared for by a hospitalist during your hospital stay. If you have any questions about your discharge medications or the care you received while you were in the hospital after you are discharged, you can call the unit and asked to speak with the hospitalist on call if the hospitalist that took care of you is not available. Once you are discharged, your primary care physician will handle any further medical issues. Please note that NO REFILLS for any discharge medications will be authorized once you are discharged, as it is imperative that you return to your primary care physician (or establish a relationship with a primary care physician if you do not have one) for your aftercare needs so that they can reassess your need for medications and monitor your lab values.    On the day of Discharge:  VITAL SIGNS:  Blood pressure 118/58, pulse 81, temperature 98.6 F (37 C), temperature source Oral, resp. rate 16, height  (1.651 m), weight 147 lb 8 oz (66.906 kg), SpO2 98 %.  I/O:   Intake/Output Summary (Last 24 hours) at 01/03/16 1526 Last data filed at 01/03/16 0732  Gross per 24 hour  Intake    413 ml  Output      0 ml  Net    413 ml    PHYSICAL EXAMINATION:  GENERAL:  75  y.o.-year-old patient lying in the bed with no acute distress.  EYES: Pupils equal, round, reactive to light and accommodation. No scleral icterus. Extraocular muscles intact.  HEENT: Head atraumatic, normocephalic. Oropharynx and nasopharynx clear.  NECK:  Supple, no jugular venous distention. No thyroid enlargement, no tenderness.  LUNGS: Normal breath sounds bilaterally, no wheezing, rales,rhonchi or crepitation. No use of accessory muscles of respiration.  CARDIOVASCULAR: S1, S2 normal. No murmurs, rubs, or gallops.  ABDOMEN: Soft, non-tender, non-distended. Bowel sounds present. No organomegaly or mass.  EXTREMITIES: No pedal edema, cyanosis, or clubbing.  NEUROLOGIC: Cranial nerves II through XII are intact. Muscle strength 5/5 in all extremities. Sensation intact. Gait not checked.  PSYCHIATRIC: The patient is alert and oriented x 3.  SKIN: No obvious rash, lesion, or ulcer.   DATA REVIEW:   CBC  Recent Labs Lab 01/03/16 0617  WBC 8.8  HGB 10.8*  HCT 31.7*  PLT 62*    Chemistries   Recent Labs Lab 01/01/16 1050 01/01/16 1646 01/02/16 0514  NA 138  --  141  K 2.8*  --  5.1  CL 103  --  111  CO2 28  --  25  GLUCOSE 90  --  89  BUN 14  --  9  CREATININE 1.26*  --  1.01*  CALCIUM 8.2*  --  7.9*  MG  --  1.8  --   AST 32  --   --   ALT 15  --   --   ALKPHOS 72  --   --   BILITOT 1.8*  --   --     Cardiac Enzymes  Recent Labs Lab 01/01/16 2256  TROPONINI 0.47*    Microbiology Results  Results for orders placed or performed during the hospital encounter of 01/01/16  Urine culture     Status: None   Collection Time: 01/01/16 10:51 AM  Result Value Ref Range Status   Specimen Description URINE, CLEAN CATCH  Final   Special Requests Normal  Final   Culture NO GROWTH 1 DAY  Final   Report Status 01/03/2016 FINAL  Final    RADIOLOGY:  No results found.   Management plans discussed with the patient, family and they are in agreement.  CODE STATUS:      Code Status Orders        Start     Ordered   01/01/16 1522  Full code   Continuous     01/01/16 1521    Code Status History    Date Active Date Inactive Code Status Order ID Comments User Context   This patient has a current code status but no historical code status.      TOTAL TIME TAKING CARE OF THIS PATIENT: 28 minutes.    Hower,  Mardi Mainland.D on 01/03/2016 at 3:26 PM  Between 7am to 6pm - Pager - [phone]  After 6pm go to [email or web address] - password Forensic psychologist Hospitalists  Office  [phone]  CC: Primary care physician; Dorothey Baseman, MD

## 2016-01-03 NOTE — Evaluation (Signed)
Physical Therapy Evaluation Patient Details Name: Laura Levy MRN: [id number] DOB: December 24, 1940 Today's Date: 01/03/2016   History of Present Illness  75 yo F brought to ED after she was found in the floor by her family and was found to have UTI, hypokalemia, weakness and elevated troponin. PMH includes HTN, CVA with R sided weakness and decreased ROM, and dementia.  Clinical Impression  Pt demonstrated generalized weakness and limited functional mobility. She is oriented to self and "hospital"; pleasant. At rest SpO2 98% RA and HR 81. For bed mobility pt requires mod A and use of rail. Upon attempt of STS at EOB, she required max A and quickly/suddenly sits back down on the bed due to posterior LOB. Attempted STS again, with pt continuing to have strong posterior lean and required max A with QC to stand pivot over to recliner. She had difficulty with sequencing and moving LEs, needing constant cues for technique. She is unable to ambulate at this time due to zero standing balance. Pt's cognition limits session and her ability to correct balance with cues. Pt will benefit from skilled PT services to increase functional I and mobility for safe discharge. STR would benefit pt to increase functional mobility and reduce caregiver burden of care.     Follow Up Recommendations SNF;Supervision for mobility/OOB    Equipment Recommendations       Recommendations for Other Services       Precautions / Restrictions Precautions Precautions: Fall      Mobility  Bed Mobility Overal bed mobility: Needs Assistance Bed Mobility: Supine to Sit     Supine to sit: Mod assist;HOB elevated     General bed mobility comments: difficulty getting trunk upright  Transfers Overall transfer level: Needs assistance Equipment used: Quad cane Transfers: Sit to/from [employer] Sit to Stand: Max assist Stand pivot transfers: Max assist       General transfer comment: strong posterior lean;  difficulty processing and taking steps  Ambulation/Gait             General Gait Details: unable at this time due to poor standing balance  Stairs            Wheelchair Mobility    Modified Rankin (Stroke Patients Only)       Balance Overall balance assessment: Needs assistance;History of Falls Sitting-balance support: Single extremity supported;Feet supported Sitting balance-Leahy Scale: Poor Sitting balance - Comments: maintains after initial assistance Postural control: Posterior lean Standing balance support: Single extremity supported Standing balance-Leahy Scale: Zero Standing balance comment: strong posterior lean                             Pertinent Vitals/Pain Pain Assessment: No/denies pain    Home Living Family/patient expects to be discharged to:: Private residence Living Arrangements: Children Available Help at Discharge: Family;Available PRN/intermittently;Other (Comment) (Family works during the day) Type of Home: House Home Access: Level entry;Other (comment) (Per pt; poor historian due to dementia)     Home Layout: One level Home Equipment: Cane - quad Additional Comments: Home environment and PLOF difficult to get details due to pt's dementia. No family present at this time.    Prior Function Level of Independence: Needs assistance   Gait / Transfers Assistance Needed: Used quad cane  ADL's / Homemaking Assistance Needed: assisted by family  Comments: pt had assistance for ADLs from family     Hand Dominance  Extremity/Trunk Assessment   Upper Extremity Assessment: Generalized weakness;RUE deficits/detail RUE Deficits / Details: Very limited ROM of shoulder, elbow and hand due to previous CVA; unable to use for functional mobility         Lower Extremity Assessment: Generalized weakness;RLE deficits/detail RLE Deficits / Details: Decreased ankle DF/PF likely from previous CVA.       Communication    Communication: Expressive difficulties;Other (comment) (likely due to previous CVA)  Cognition Arousal/Alertness: Awake/alert Behavior During Therapy: WFL for tasks assessed/performed Overall Cognitive Status: History of cognitive impairments - at baseline       Memory: Decreased short-term memory              General Comments      Exercises Other Exercises Other Exercises: B LE therex: supine heelslides, hip abd slides; seated LAQs, marching and hip abd with manual resistance x10 each. VC for technique.      Assessment/Plan    PT Assessment Patient needs continued PT services  PT Diagnosis Difficulty walking;Generalized weakness   PT Problem List Decreased strength;Decreased range of motion;Decreased activity tolerance;Decreased balance;Decreased mobility;Decreased safety awareness  PT Treatment Interventions Gait training;Therapeutic activities;Therapeutic exercise;Balance training;Patient/family education;Neuromuscular re-education   PT Goals (Current goals can be found in the Care Plan section) Acute Rehab PT Goals PT Goal Formulation: Patient unable to participate in goal setting Time For Goal Achievement: 01/17/16 Potential to Achieve Goals: Fair    Frequency Min 2X/week   Barriers to discharge Decreased caregiver support family works during the day    Co-evaluation               End of Journalist, newspaper During Treatment: Gait belt Activity Tolerance: Patient tolerated treatment well Patient left: in chair;with call bell/phone within reach;with chair alarm set Nurse Communication: Mobility status    Functional Assessment Tool Used: clinical judgement, standing balance Functional Limitation: Mobility: Walking and moving around Mobility: Walking and Moving Around Current Status [phone]): At least 60 percent but less than 80 percent impaired, limited or restricted Mobility: Walking and Moving Around Goal Status [phone]): At least 20 percent but  less than 40 percent impaired, limited or restricted    Time: 1343-1413 PT Time Calculation (min) (ACUTE ONLY): 30 min   Charges:   PT Evaluation $PT Eval Moderate Complexity: 1 Procedure PT Treatments $Therapeutic Exercise: 8-22 mins   PT G Codes:   PT G-Codes **NOT FOR INPATIENT CLASS** Functional Assessment Tool Used: clinical judgement, standing balance Functional Limitation: Mobility: Walking and moving around Mobility: Walking and Moving Around Current Status (U9811): At least 60 percent but less than 80 percent impaired, limited or restricted Mobility: Walking and Moving Around Goal Status [phone]): At least 20 percent but less than 40 percent impaired, limited or restricted    Adelene Idler, PT, DPT  01/03/2016, 2:43 PM [phone]

## 2016-02-16 ENCOUNTER — Inpatient Hospital Stay
Admission: EM | Admit: 2016-02-16 | Discharge: 2016-02-19 | DRG: 065 | Disposition: A | Discharge status: Skilled Nursing Facility | Payer: Medicare Other | Attending: Internal Medicine | Admitting: Internal Medicine

## 2016-02-16 ENCOUNTER — Emergency Department: Payer: Medicare Other

## 2016-02-16 ENCOUNTER — Inpatient Hospital Stay: Payer: Medicare Other

## 2016-02-16 DIAGNOSIS — D696 Thrombocytopenia, unspecified: Secondary | ICD-10-CM | POA: Diagnosis present

## 2016-02-16 DIAGNOSIS — I639 Cerebral infarction, unspecified: Secondary | ICD-10-CM

## 2016-02-16 DIAGNOSIS — I10 Essential (primary) hypertension: Secondary | ICD-10-CM

## 2016-02-16 DIAGNOSIS — Z7982 Long term (current) use of aspirin: Secondary | ICD-10-CM

## 2016-02-16 DIAGNOSIS — Z87891 Personal history of nicotine dependence: Secondary | ICD-10-CM | POA: Diagnosis not present

## 2016-02-16 DIAGNOSIS — I129 Hypertensive chronic kidney disease with stage 1 through stage 4 chronic kidney disease, or unspecified chronic kidney disease: Secondary | ICD-10-CM | POA: Diagnosis present

## 2016-02-16 DIAGNOSIS — Z79899 Other long term (current) drug therapy: Secondary | ICD-10-CM

## 2016-02-16 DIAGNOSIS — N183 Chronic kidney disease, stage 3 unspecified: Secondary | ICD-10-CM

## 2016-02-16 DIAGNOSIS — F0390 Unspecified dementia without behavioral disturbance: Secondary | ICD-10-CM | POA: Diagnosis present

## 2016-02-16 DIAGNOSIS — Z9842 Cataract extraction status, left eye: Secondary | ICD-10-CM | POA: Diagnosis not present

## 2016-02-16 DIAGNOSIS — D649 Anemia, unspecified: Secondary | ICD-10-CM | POA: Diagnosis present

## 2016-02-16 DIAGNOSIS — I739 Peripheral vascular disease, unspecified: Secondary | ICD-10-CM | POA: Diagnosis present

## 2016-02-16 DIAGNOSIS — R531 Weakness: Secondary | ICD-10-CM

## 2016-02-16 DIAGNOSIS — Z8249 Family history of ischemic heart disease and other diseases of the circulatory system: Secondary | ICD-10-CM

## 2016-02-16 DIAGNOSIS — E559 Vitamin D deficiency, unspecified: Secondary | ICD-10-CM | POA: Diagnosis present

## 2016-02-16 DIAGNOSIS — I69351 Hemiplegia and hemiparesis following cerebral infarction affecting right dominant side: Secondary | ICD-10-CM

## 2016-02-16 DIAGNOSIS — Z961 Presence of intraocular lens: Secondary | ICD-10-CM | POA: Diagnosis present

## 2016-02-16 DIAGNOSIS — I6501 Occlusion and stenosis of right vertebral artery: Secondary | ICD-10-CM | POA: Diagnosis present

## 2016-02-16 DIAGNOSIS — E876 Hypokalemia: Secondary | ICD-10-CM | POA: Diagnosis present

## 2016-02-16 DIAGNOSIS — Z9841 Cataract extraction status, right eye: Secondary | ICD-10-CM | POA: Diagnosis not present

## 2016-02-16 DIAGNOSIS — R262 Difficulty in walking, not elsewhere classified: Secondary | ICD-10-CM

## 2016-02-16 DIAGNOSIS — E785 Hyperlipidemia, unspecified: Secondary | ICD-10-CM | POA: Diagnosis present

## 2016-02-16 DIAGNOSIS — R42 Dizziness and giddiness: Secondary | ICD-10-CM

## 2016-02-16 LAB — CBC
HCT: 32.8 % — ABNORMAL LOW (ref 35.0–47.0)
Hemoglobin: 11 g/dL — ABNORMAL LOW (ref 12.0–16.0)
MCH: 29.1 pg (ref 26.0–34.0)
MCHC: 33.5 g/dL (ref 32.0–36.0)
MCV: 86.8 fL (ref 80.0–100.0)
PLATELETS: 116 10*3/uL — AB (ref 150–440)
RBC: 3.78 MIL/uL — AB (ref 3.80–5.20)
RDW: 15.1 % — ABNORMAL HIGH (ref 11.5–14.5)
WBC: 7.3 10*3/uL (ref 3.6–11.0)

## 2016-02-16 LAB — BASIC METABOLIC PANEL
Anion gap: 5 (ref 5–15)
BUN: 14 mg/dL (ref 6–20)
CALCIUM: 9.2 mg/dL (ref 8.9–10.3)
CO2: 25 mmol/L (ref 22–32)
CREATININE: 1.17 mg/dL — AB (ref 0.44–1.00)
Chloride: 109 mmol/L (ref 101–111)
GFR calc non Af Amer: 45 mL/min — ABNORMAL LOW (ref >60)
GFR, EST AFRICAN AMERICAN: 53 mL/min — AB (ref >60)
Glucose, Bld: 101 mg/dL — ABNORMAL HIGH (ref 65–99)
Potassium: 3.4 mmol/L — ABNORMAL LOW (ref 3.5–5.1)
Sodium: 139 mmol/L (ref 135–145)

## 2016-02-16 LAB — URINALYSIS COMPLETE WITH MICROSCOPIC (ARMC ONLY)
BILIRUBIN URINE: NEGATIVE
Bacteria, UA: NONE SEEN
GLUCOSE, UA: NEGATIVE mg/dL
Hgb urine dipstick: NEGATIVE
Leukocytes, UA: NEGATIVE
Nitrite: NEGATIVE
PH: 7 (ref 5.0–8.0)
Protein, ur: NEGATIVE mg/dL
Specific Gravity, Urine: 1.008 (ref 1.005–1.030)

## 2016-02-16 LAB — TROPONIN I: Troponin I: 0.03 ng/mL (ref <0.031)

## 2016-02-16 LAB — TSH: TSH: 1.164 u[IU]/mL (ref 0.350–4.500)

## 2016-02-16 MED ORDER — ACETAMINOPHEN 325 MG PO TABS: 650.0000 mg | ORAL_TABLET | Freq: Four times a day (QID) | ORAL | Status: DC | PRN

## 2016-02-16 MED ORDER — ATORVASTATIN CALCIUM 20 MG PO TABS
80.0000 mg | ORAL_TABLET | Freq: Every day | ORAL | Status: DC
  Administered 2016-02-16 – 2016-02-19 (×4): 80 mg via ORAL
  Filled 2016-02-16 (×4): qty 4

## 2016-02-16 MED ORDER — VITAMIN B-12 1000 MCG PO TABS
1000.0000 ug | ORAL_TABLET | Freq: Every day | ORAL | Status: DC
  Administered 2016-02-16 – 2016-02-19 (×4): 1000 ug via ORAL
  Filled 2016-02-16 (×4): qty 1

## 2016-02-16 MED ORDER — ENOXAPARIN SODIUM 40 MG/0.4ML ~~LOC~~ SOLN
40.0000 mg | SUBCUTANEOUS | Status: DC
  Administered 2016-02-16 – 2016-02-18 (×3): 40 mg via SUBCUTANEOUS
  Filled 2016-02-16 (×3): qty 0.4

## 2016-02-16 MED ORDER — CLOPIDOGREL BISULFATE 75 MG PO TABS
75.0000 mg | ORAL_TABLET | Freq: Every day | ORAL | Status: DC
  Administered 2016-02-16 – 2016-02-19 (×4): 75 mg via ORAL
  Filled 2016-02-16 (×4): qty 1

## 2016-02-16 MED ORDER — ACETAMINOPHEN 650 MG RE SUPP: 650.0000 mg | Freq: Four times a day (QID) | RECTAL | Status: DC | PRN

## 2016-02-16 MED ORDER — ASPIRIN EC 81 MG PO TBEC
81.0000 mg | DELAYED_RELEASE_TABLET | Freq: Every day | ORAL | Status: DC
  Administered 2016-02-16 – 2016-02-19 (×4): 81 mg via ORAL
  Filled 2016-02-16 (×4): qty 1

## 2016-02-16 MED ORDER — SODIUM CHLORIDE 0.9 % IV SOLN
INTRAVENOUS | Status: DC
  Administered 2016-02-16: 21:00:00 via INTRAVENOUS

## 2016-02-16 MED ORDER — AMLODIPINE BESYLATE 10 MG PO TABS
10.0000 mg | ORAL_TABLET | Freq: Every day | ORAL | Status: DC
  Administered 2016-02-16 – 2016-02-19 (×4): 10 mg via ORAL
  Filled 2016-02-16 (×4): qty 1

## 2016-02-16 MED ORDER — SODIUM CHLORIDE 0.9% FLUSH
3.0000 mL | Freq: Two times a day (BID) | INTRAVENOUS | Status: DC
  Administered 2016-02-16 – 2016-02-19 (×5): 3 mL via INTRAVENOUS

## 2016-02-16 NOTE — ED Notes (Signed)
Attempted to ambulate pt with walker. Pt unable to talk any steps forward. Pts family reports this is not normal for her.

## 2016-02-16 NOTE — ED Provider Notes (Signed)
[facility] Emergency Department Provider Note   ____________________________________________  Time seen:  I have reviewed the triage vital signs and the triage nursing note.  HISTORY  Chief Complaint Weakness   Historian Limited history per patient  HPI Laura Levy is a 75 y.o. female who has a history of a stroke that affected the right side of her body, as well as a history of dementia, who reportedly lives at home with her daughter. Daughter is here but history is provided by report from the EMS.  Supposedly the patient had less energy/generalized weakness, not getting up and walking around this morning as usual. No reported focal weakness. No reported altered mental status.   There is report that she had recently been diagnosed with UTI, but unclear whether she is already on antibiotics at this point or not.  Was reported that she has chronic right arm weakness that she holds across the front of her chest, and chronic drooling.   Past Medical History  Diagnosis Date  . Hypertension   . Stroke (HCC)   . Dementia   . Edema leg     Patient Active Problem List   Diagnosis Date Noted  . UTI (lower urinary tract infection) 01/01/2016  . Hypokalemia 01/01/2016  . Weakness 01/01/2016    Past Surgical History  Procedure Laterality Date  . Cataract extraction w/phaco Right 03/27/2015    Procedure: CATARACT EXTRACTION PHACO AND INTRAOCULAR LENS PLACEMENT (IOC);  Surgeon: Galen Manila, MD;  Location: [facility] ORS;  Service: Ophthalmology;  Laterality: Right;  [place] 01:00 AP%28.9 CDE 17.42  . Cataract extraction w/phaco Left 05/29/2015    Procedure: CATARACT EXTRACTION PHACO AND INTRAOCULAR LENS PLACEMENT (IOC);  Surgeon: Galen Manila, MD;  Location: [facility] ORS;  Service: Ophthalmology;  Laterality: Left;  cassette lot# [id number]    [place] 00:27.7  AP 22.3 CDE  6.17    Current Outpatient Rx  Name  Route  Sig  Dispense  Refill  . amLODipine (NORVASC) 10 MG  tablet   Oral   Take 10 mg by mouth daily.         Marland Kitchen aspirin EC 81 MG tablet   Oral   Take 81 mg by mouth daily.         Marland Kitchen atorvastatin (LIPITOR) 80 MG tablet   Oral   Take 80 mg by mouth daily.         . clopidogrel (PLAVIX) 75 MG tablet   Oral   Take 75 mg by mouth daily.         . vitamin B-12 (CYANOCOBALAMIN) 1000 MCG tablet   Oral   Take 1,000 mcg by mouth daily.           Allergies Review of patient's allergies indicates no known allergies.  Family History  Problem Relation Age of Onset  . Congestive Heart Failure Mother     Social History Social History  Substance Use Topics  . Smoking status: Former Games developer  . Smokeless tobacco: None  . Alcohol Use: No    Review of Systems Limited, patient poor historian, dementia Constitutional:  Eyes:  ENT:  Cardiovascular: Negative for chest pain. Respiratory: Negative for shortness of breath. Gastrointestinal: Negative for abdominal pain, vomiting and diarrhea. Genitourinary: Negative for dysuria. Musculoskeletal:  Skin: Neurological:   ____________________________________________   PHYSICAL EXAM:  VITAL SIGNS: ED Triage Vitals  Enc Vitals Group     BP 02/16/16 0949 124/64 mmHg     Pulse Rate 02/16/16 0949 82  Resp 02/16/16 0949 12     Temp 02/16/16 0949 97.8 F (36.6 C)     Temp Source 02/16/16 0949 Oral     SpO2 02/16/16 0949 96 %     Weight --      Height --      Head Cir --      Peak Flow --      Pain Score --      Pain Loc --      Pain Edu? --      Excl. in GC? --      Constitutional: Alert and Cooperative but poor historian. Well appearing and in no distress. HEENT   Head: Normocephalic and atraumatic.      Eyes: Conjunctivae are normal. PERRL. Normal extraocular movements.      Ears:         Nose: No congestion/rhinnorhea.   Mouth/Throat: Mucous membranes are moist. Some drooling down the right side of her mouth   Neck: No stridor. Cardiovascular/Chest: Normal  rate, regular rhythm.  No murmurs, rubs, or gallops. Respiratory: Normal respiratory effort without tachypnea nor retractions. Breath sounds are clear and equal bilaterally. No wheezes/rales/rhonchi. Gastrointestinal: Soft. No distention, no guarding, no rebound. Nontender.   Genitourinary/rectal:Deferred Musculoskeletal: Nontender with normal range of motion in all extremities. No joint effusions.  No lower extremity tenderness.  No edema. Neurologic: No slurred speech. She does seem to be drooling a little bit out of the right side of her mouth. Cooperative and able to follow commands. Holds her right arm to the fire body but does have some movement. 4-5 strength in bilateral lower extremities equally. Skin:  Skin is warm, dry and intact. No rash noted. Psychiatric: Interactive, poor historian. No psychosis.  ____________________________________________   EKG I, Governor Rooksebecca Dewaine Morocho, MD, the attending physician have personally viewed and interpreted all ECGs.  82 bpm. Normal sinus rhythm. Narrow QRS. Left axis deviation. Nonspecific T-wave ____________________________________________  LABS (pertinent positives/negatives)  Potassium 3.4, creatinine 1.17 otherwise without significant abnormality on metabolic panel White blood cell count 7.3, hemoglobin 11.0 and platelet count 116 Urinalysis trace ketones otherwise negative Troponin 0.03  ____________________________________________  RADIOLOGY All Xrays were viewed by me. Imaging interpreted by Radiologist.  CT head without contrast:  IMPRESSION: 1. No acute intracranial process. 2. Similar findings of advanced atrophy, bilateral MCA, right PCA and bilateral cerebellar hemisphere infarcts as detailed above. __________________________________________  PROCEDURES  Procedure(s) performed: None  Critical Care performed: None  ____________________________________________   ED COURSE / ASSESSMENT AND PLAN  Pertinent labs & imaging  results that were available during my care of the patient were reviewed by me and considered in my medical decision making (see chart for details).   It sounds like the complaint was reported as generalized weakness, and indeed there is no history of focal weakness by history or on exam findings.   When daughter did show up, she states that the son is the primary caregiver, and he would be here shortly.  My initial inclination was to check for acute metabolic issues including urine tract infection.  EKG and troponin are reassuring. No elevated blood cell count. Hemoglobin 11.  No evidence of acute renal failure, dehydration, urinary tract infection, or I abnormalities.  When the son got there, we did get the patient up to walk her and she was unable to stand and walk on her own. She typically walks with a cane into this is a acute change from baseline.  I discussed with the family eyelashes focal  evidence of stroke on exam, this inability to walk causes me to question rechecking a head CT. Head CT was performed and no new stroke was seen although this would be under 24 hours.  I discussed with the hospitalist admission and observation. If the patient does not come back to baseline, she won't really be able to go home like this, as it would not go to care for her she is unable to walk.    CONSULTATIONS:   Hospitalist, Dr. Allena Katz, for admission.  Patient / Family / Caregiver informed of clinical course, medical decision-making process, and agree with plan.     ___________________________________________   FINAL CLINICAL IMPRESSION(S) / ED DIAGNOSES   Final diagnoses:  Weakness  Trouble walking              Note: This dictation was prepared with Nurse, children's dictation. Any transcriptional errors that result from this process are unintentional   Governor Rooks, MD 02/16/16 [phone]

## 2016-02-16 NOTE — ED Notes (Signed)
Pt incontinent. Pt cleaned and changed.

## 2016-02-16 NOTE — ED Notes (Signed)
Pt incontinent. Pt cleaned and changed. 

## 2016-02-16 NOTE — ED Notes (Signed)
Pt transported to MRI 

## 2016-02-16 NOTE — ED Notes (Signed)
Pt came via EMS from home. Per EMS, pt was experiencing weakness since this am. Pt has history of stroke 2 years ago with ride side deficits. Pt recently diagnosed with UTI but reports not being treated with anything. Pt alert and orientedx2. Pts family reports this is baseline. Pt reports no pain and reports not feeling any weakness and reports her daughter wanted her to get checked out.

## 2016-02-16 NOTE — H&P (Signed)
[facility] [place] at [facility]   PATIENT NAME: Laura Levy    MR#:  [id number]  DATE OF BIRTH:  1941/09/16  DATE OF ADMISSION:  02/16/2016  PRIMARY CARE PHYSICIAN: Dorothey BasemanAVID BRONSTEIN, MD   REQUESTING/REFERRING PHYSICIAN: Governor Rooksebecca Lord MD  CHIEF COMPLAINT:   Chief Complaint  Patient presents with  . Weakness    HISTORY OF PRESENT ILLNESS: Laura Levy  is a 75 y.o. female with a known history of  Hypertension, history of stroke, dementia, history of edema of the leg. Was brought in by family due to patient having difficulty with walking. Patient had a CT scan of the head which was negative. She is a very poor historian and her family is at the bedside but they're not able to provide me with much history. They do report that normally she walks with a cane and they had to help her sometimes.       PAST MEDICAL HISTORY:   Past Medical History  Diagnosis Date  . Hypertension   . Stroke (HCC)   . Dementia   . Edema leg     PAST SURGICAL HISTORY: Past Surgical History  Procedure Laterality Date  . Cataract extraction w/phaco Right 03/27/2015    Procedure: CATARACT EXTRACTION PHACO AND INTRAOCULAR LENS PLACEMENT (IOC);  Surgeon: Galen ManilaWilliam Porfilio, MD;  Location: [facility] ORS;  Service: Ophthalmology;  Laterality: Right;  US 01:00 AP%28.9 CDE 17.42  . Cataract extraction w/phaco Left 05/29/2015    Procedure: CATARACT EXTRACTION PHACO AND INTRAOCULAR LENS PLACEMENT (IOC);  Surgeon: Galen ManilaWilliam Porfilio, MD;  Location: [facility] ORS;  Service: Ophthalmology;  Laterality: Left;  cassette lot# [id number]    US 00:27.7  AP 22.3 CDE  6.17    SOCIAL HISTORY:  Social History  Substance Use Topics  . Smoking status: Former Games developermoker  . Smokeless tobacco: Not on file  . Alcohol Use: No    FAMILY HISTORY:  Family History  Problem Relation Age of Onset  . Congestive Heart Failure Mother     DRUG ALLERGIES: No Known Allergies  REVIEW OF SYSTEMS:   CONSTITUTIONAL: No fever,  Positive fatigue and weakness.  EYES: No blurred or double vision.  EARS, NOSE, AND THROAT: No tinnitus or ear pain.  RESPIRATORY: No cough, shortness of breath, wheezing or hemoptysis.  CARDIOVASCULAR: No chest pain, orthopnea, edema.  GASTROINTESTINAL: No nausea, vomiting, diarrhea or abdominal pain.  GENITOURINARY: No dysuria, hematuria.  ENDOCRINE: No polyuria, nocturia,  HEMATOLOGY: No anemia, easy bruising or bleeding SKIN: No rash or lesion. MUSCULOSKELETAL: No joint pain or arthritis.   NEUROLOGIC: No tingling, numbness, right sided weakness which is chronic  PSYCHIATRY: No anxiety or depression.   MEDICATIONS AT HOME:  Prior to Admission medications   Medication Sig Start Date End Date Taking? Authorizing Provider  amLODipine (NORVASC) 10 MG tablet Take 10 mg by mouth daily.   Yes Historical Provider, MD  aspirin EC 81 MG tablet Take 81 mg by mouth daily.   Yes Historical Provider, MD  atorvastatin (LIPITOR) 80 MG tablet Take 80 mg by mouth daily.   Yes Historical Provider, MD  clopidogrel (PLAVIX) 75 MG tablet Take 75 mg by mouth daily.   Yes Historical Provider, MD  vitamin B-12 (CYANOCOBALAMIN) 1000 MCG tablet Take 1,000 mcg by mouth daily.   Yes Historical Provider, MD      PHYSICAL EXAMINATION:   VITAL SIGNS: Blood pressure 125/63, pulse 68, temperature 97.8 F (36.6 C), temperature source Oral, resp. rate 20, SpO2 98 %.  GENERAL:  75 y.o.-year-old patient lying in the bed with no acute distress.  EYES: Pupils equal, round, reactive to light and accommodation. No scleral icterus. Extraocular muscles intact.  HEENT: Head atraumatic, normocephalic. Oropharynx and nasopharynx clear.  NECK:  Supple, no jugular venous distention. No thyroid enlargement, no tenderness.  LUNGS: Normal breath sounds bilaterally, no wheezing, rales,rhonchi or crepitation. No use of accessory muscles of respiration.  CARDIOVASCULAR: S1, S2 normal. No murmurs, rubs, or gallops.  ABDOMEN: Soft,  nontender, nondistended. Bowel sounds present. No organomegaly or mass.  EXTREMITIES: No pedal edema, cyanosis, or clubbing.  NEUROLOGIC: Cranial nerves II through XII are intact. Right upper extremity and right lower extremity weakness PSYCHIATRIC: The patient is alert and oriented x 3.  SKIN: No obvious rash, lesion, or ulcer.   LABORATORY PANEL:   CBC  Recent Labs Lab 02/16/16 1005  WBC 7.3  HGB 11.0*  HCT 32.8*  PLT 116*  MCV 86.8  MCH 29.1  MCHC 33.5  RDW 15.1*   ------------------------------------------------------------------------------------------------------------------  Chemistries   Recent Labs Lab 02/16/16 1005  NA 139  K 3.4*  CL 109  CO2 25  GLUCOSE 101*  BUN 14  CREATININE 1.17*  CALCIUM 9.2   ------------------------------------------------------------------------------------------------------------------ CrCl cannot be calculated (Unknown ideal weight.). ------------------------------------------------------------------------------------------------------------------ No results for input(s): TSH, T4TOTAL, T3FREE, THYROIDAB in the last 72 hours.  Invalid input(s): FREET3   Coagulation profile No results for input(s): INR, PROTIME in the last 168 hours. ------------------------------------------------------------------------------------------------------------------- No results for input(s): DDIMER in the last 72 hours. -------------------------------------------------------------------------------------------------------------------  Cardiac Enzymes  Recent Labs Lab 02/16/16 1005  TROPONINI 0.03   ------------------------------------------------------------------------------------------------------------------ Invalid input(s): POCBNP  ---------------------------------------------------------------------------------------------------------------  Urinalysis    Component Value Date/Time   COLORURINE STRAW* 02/16/2016 1313    COLORURINE Yellow 12/01/2013 0522   APPEARANCEUR CLEAR* 02/16/2016 1313   APPEARANCEUR Clear 12/01/2013 0522   LABSPEC 1.008 02/16/2016 1313   LABSPEC 1.012 12/01/2013 0522   PHURINE 7.0 02/16/2016 1313   PHURINE 5.0 12/01/2013 0522   GLUCOSEU NEGATIVE 02/16/2016 1313   GLUCOSEU Negative 12/01/2013 0522   HGBUR NEGATIVE 02/16/2016 1313   HGBUR Negative 12/01/2013 0522   BILIRUBINUR NEGATIVE 02/16/2016 1313   BILIRUBINUR Negative 12/01/2013 0522   KETONESUR TRACE* 02/16/2016 1313   KETONESUR Negative 12/01/2013 0522   PROTEINUR NEGATIVE 02/16/2016 1313   PROTEINUR Negative 12/01/2013 0522   NITRITE NEGATIVE 02/16/2016 1313   NITRITE Negative 12/01/2013 0522   LEUKOCYTESUR NEGATIVE 02/16/2016 1313   LEUKOCYTESUR Negative 12/01/2013 0522     RADIOLOGY: Ct Head Wo Contrast  02/16/2016  CLINICAL DATA:  Weakness since this morning. History of stroke 2 years ago with residual right-sided defects. EXAM: CT HEAD WITHOUT CONTRAST TECHNIQUE: Contiguous axial images were obtained from the base of the skull through the vertex without intravenous contrast. COMPARISON:  01/01/2016; 11/17/2014; 12/02/2013 FINDINGS: Similar findings of advanced atrophy with sulcal prominence. Re- demonstrated the sequela of prior bilateral MCA distribution infarcts, left greater than right, and a right PCA distribution infarct (image 14, series 10) with associated encephalomalacia and volume loss. Old infarcts involving the bilateral cerebellar hemispheres are also unchanged. Grossly unchanged rather extensive periventricular hypodensities compatible with microvascular ischemic disease. Given extensive background parenchymal abnormalities, there is no CT evidence of superimposed acute large territory infarct. No intraparenchymal or extra-axial mass or hemorrhage. Unchanged size a configuration of the ventricles and basilar cisterns. No midline shift. Intracranial atherosclerosis. There is unchanged polypoid thickening of  the left sphenoid sinus. The remaining paranasal sinuses and mastoid air cells are normally aerated. No air-fluid  levels. Regional soft tissues appear normal. No displaced calvarial fracture. IMPRESSION: 1. No acute intracranial process. 2. Similar findings of advanced atrophy, bilateral MCA, right PCA and bilateral cerebellar hemisphere infarcts as detailed above. Electronically Signed   By: Simonne Come M.D.   On: 02/16/2016 15:41    EKG: Orders placed or performed during the hospital encounter of 02/16/16  . EKG 12-Lead  . EKG 12-Lead    IMPRESSION AND PLAN: Patient is a 75 year old African-American female brought in for weakness  1. Ambulatory dysfunction unclear if there is extension of the stroke but appears to have chronic weakness. I will obtain MRI of the brain and physical therapy evaluation  2. History of stroke continue Plavix as taking at home  3. Hypertension continue amlodipine  4. Lipidemia continue atorvastatin  5. Miscellaneous Lovenox for DVT prophylaxis      All the records are reviewed and case discussed with ED provider. Management plans discussed with the patient, family and they are in agreement.  CODE STATUS:    Code Status Orders        Start     Ordered   02/16/16 1704  Full code   Continuous     02/16/16 1704    Code Status History    Date Active Date Inactive Code Status Order ID Comments User Context   01/01/2016  3:22 PM 01/03/2016  9:03 PM Full Code [id number]  Shaune Pollack, MD Inpatient       TOTAL TIME TAKING CARE OF THIS PATIENT:19minutes.    Auburn Bilberry M.D on 02/16/2016 at 5:07 PM  Between 7am to 6pm - Pager - [phone]  After 6pm go to [email or web address] - password [email or web address] [facility]  [place] [facility]  Office  [phone]  CC: Primary care physician; Dorothey Baseman, MD

## 2016-02-17 DIAGNOSIS — R531 Weakness: Secondary | ICD-10-CM

## 2016-02-17 LAB — BASIC METABOLIC PANEL
Anion gap: 5 (ref 5–15)
BUN: 11 mg/dL (ref 6–20)
CHLORIDE: 109 mmol/L (ref 101–111)
CO2: 26 mmol/L (ref 22–32)
CREATININE: 1.06 mg/dL — AB (ref 0.44–1.00)
Calcium: 8.8 mg/dL — ABNORMAL LOW (ref 8.9–10.3)
GFR calc Af Amer: 59 mL/min — ABNORMAL LOW (ref >60)
GFR calc non Af Amer: 51 mL/min — ABNORMAL LOW (ref >60)
Glucose, Bld: 92 mg/dL (ref 65–99)
Potassium: 2.9 mmol/L — CL (ref 3.5–5.1)
SODIUM: 140 mmol/L (ref 135–145)

## 2016-02-17 LAB — CBC
HEMATOCRIT: 33.5 % — AB (ref 35.0–47.0)
Hemoglobin: 11.4 g/dL — ABNORMAL LOW (ref 12.0–16.0)
MCH: 30 pg (ref 26.0–34.0)
MCHC: 34.1 g/dL (ref 32.0–36.0)
MCV: 88 fL (ref 80.0–100.0)
Platelets: 106 10*3/uL — ABNORMAL LOW (ref 150–440)
RBC: 3.81 MIL/uL (ref 3.80–5.20)
RDW: 15 % — ABNORMAL HIGH (ref 11.5–14.5)
WBC: 8.3 10*3/uL (ref 3.6–11.0)

## 2016-02-17 LAB — LIPID PANEL
CHOLESTEROL: 123 mg/dL (ref 0–200)
HDL: 39 mg/dL — ABNORMAL LOW (ref >40)
LDL Cholesterol: 78 mg/dL (ref 0–99)
Total CHOL/HDL Ratio: 3.2 RATIO
Triglycerides: 32 mg/dL (ref <150)
VLDL: 6 mg/dL (ref 0–40)

## 2016-02-17 LAB — HEMOGLOBIN A1C: HEMOGLOBIN A1C: 5 % (ref 4.0–6.0)

## 2016-02-17 LAB — VITAMIN B12: VITAMIN B 12: 5660 pg/mL — AB (ref 180–914)

## 2016-02-17 MED ORDER — POTASSIUM CHLORIDE CRYS ER 20 MEQ PO TBCR
40.0000 meq | EXTENDED_RELEASE_TABLET | Freq: Once | ORAL | Status: AC
  Administered 2016-02-17: 40 meq via ORAL
  Filled 2016-02-17: qty 2

## 2016-02-17 NOTE — Progress Notes (Signed)
[facility] [place] at [facility]   PATIENT NAME: Laura Levy    MR#:  [id number]  DATE OF BIRTH:  Sep 08, 1941  SUBJECTIVE:   Patient here due to difficulty walking and weakness. Patient's MRI is positive for a small nonhemorrhagic cerebellar CVA. Son is at bedside. No other acute complaints presently.  REVIEW OF SYSTEMS:    Review of Systems  Constitutional: Negative for fever and chills.  HENT: Negative for congestion and tinnitus.   Eyes: Negative for blurred vision and double vision.  Respiratory: Negative for cough, shortness of breath and wheezing.   Cardiovascular: Negative for chest pain, orthopnea and PND.  Gastrointestinal: Negative for nausea, vomiting, abdominal pain and diarrhea.  Genitourinary: Negative for dysuria and hematuria.  Neurological: Negative for dizziness, sensory change and focal weakness.  All other systems reviewed and are negative.   Nutrition: Heart healthy Tolerating Diet: Yes Tolerating PT: Evaluation noted   DRUG ALLERGIES:  No Known Allergies  VITALS:  Blood pressure 123/60, pulse 82, temperature 97.8 F (36.6 C), temperature source Oral, resp. rate 20, height  (1.626 m), weight 64.456 kg (142 lb 1.6 oz), SpO2 98 %.  PHYSICAL EXAMINATION:   Physical Exam  GENERAL:  75 y.o.-year-old patient lying in the bed in no acute distress.  EYES: Pupils equal, round, reactive to light and accommodation. No scleral icterus. Extraocular muscles intact.  HEENT: Head atraumatic, normocephalic. Oropharynx and nasopharynx clear.  NECK:  Supple, no jugular venous distention. No thyroid enlargement, no tenderness.  LUNGS: Normal breath sounds bilaterally, no wheezing, rales, rhonchi. No use of accessory muscles of respiration.  CARDIOVASCULAR: S1, S2 normal. No murmurs, rubs, or gallops.  ABDOMEN: Soft, nontender, nondistended. Bowel sounds present. No organomegaly or mass.  EXTREMITIES: No cyanosis, clubbing or edema b/l.     NEUROLOGIC: Cranial nerves II through XII are intact. Right upper extremity contracted and weak due to previous CVA. Right lower extremity about a 1-2 out of 5 strength. Good motor function in the left side. No other focal deficits appreciated.  PSYCHIATRIC: The patient is alert and oriented x 3. Good affect SKIN: No obvious rash, lesion, or ulcer.    LABORATORY PANEL:   CBC  Recent Labs Lab 02/17/16 0454  WBC 8.3  HGB 11.4*  HCT 33.5*  PLT 106*   ------------------------------------------------------------------------------------------------------------------  Chemistries   Recent Labs Lab 02/17/16 0454  NA 140  K 2.9*  CL 109  CO2 26  GLUCOSE 92  BUN 11  CREATININE 1.06*  CALCIUM 8.8*   ------------------------------------------------------------------------------------------------------------------  Cardiac Enzymes  Recent Labs Lab 02/16/16 1005  TROPONINI 0.03   ------------------------------------------------------------------------------------------------------------------  RADIOLOGY:  Ct Head Wo Contrast  02/16/2016  CLINICAL DATA:  Weakness since this morning. History of stroke 2 years ago with residual right-sided defects. EXAM: CT HEAD WITHOUT CONTRAST TECHNIQUE: Contiguous axial images were obtained from the base of the skull through the vertex without intravenous contrast. COMPARISON:  01/01/2016; 11/17/2014; 12/02/2013 FINDINGS: Similar findings of advanced atrophy with sulcal prominence. Re- demonstrated the sequela of prior bilateral MCA distribution infarcts, left greater than right, and a right PCA distribution infarct (image 14, series 10) with associated encephalomalacia and volume loss. Old infarcts involving the bilateral cerebellar hemispheres are also unchanged. Grossly unchanged rather extensive periventricular hypodensities compatible with microvascular ischemic disease. Given extensive background parenchymal abnormalities, there is no CT  evidence of superimposed acute large territory infarct. No intraparenchymal or extra-axial mass or hemorrhage. Unchanged size a configuration of the ventricles and basilar cisterns. No midline  shift. Intracranial atherosclerosis. There is unchanged polypoid thickening of the left sphenoid sinus. The remaining paranasal sinuses and mastoid air cells are normally aerated. No air-fluid levels. Regional soft tissues appear normal. No displaced calvarial fracture. IMPRESSION: 1. No acute intracranial process. 2. Similar findings of advanced atrophy, bilateral MCA, right PCA and bilateral cerebellar hemisphere infarcts as detailed above. Electronically Signed   By: Simonne ComeJohn  Watts M.D.   On: 02/16/2016 15:41   Mr Brain Wo Contrast  02/16/2016  CLINICAL DATA:  Patient with known history of hypertension, previous stroke, and dementia was brought in by family due to difficulty walking. EXAM: MRI HEAD WITHOUT CONTRAST TECHNIQUE: Multiplanar, multiecho pulse sequences of the brain and surrounding structures were obtained without intravenous contrast. COMPARISON:  CT head earlier today.  MR brain 12/03/2013. FINDINGS: Subcentimeter focus of acute infarction affects the RIGHT inferior cerebellum at the tonsillohemispheric junction. This is nonhemorrhagic. No other acute areas of infarction are seen. Chronic occlusion RIGHT vertebral artery. Adjacent RIGHT PICA appears patent. Flow voids appear maintained elsewhere. BILATERAL chronic cerebral and cerebellar infarcts, many sizable, as identified on previous CT and MR. Global atrophy. Hydrocephalus ex vacuo. Extensive white matter disease representing ischemic demyelination. No midline abnormality. BILATERAL cataract extraction. Sphenoid sinusitis. BILATERAL submucosal cysts in the adenoidal region could contribute to mastoid effusions. IMPRESSION: Subcentimeter acute, nonhemorrhagic, RIGHT inferior cerebellar infarct. Chronic occlusion RIGHT vertebral artery. Extensive atrophy and  small vessel disease with numerous areas of significant cerebral cerebellar chronic infarction. Electronically Signed   By: Elsie StainJohn T Curnes M.D.   On: 02/16/2016 19:26     ASSESSMENT AND PLAN:   75 year old female with past medical history of previous CVA, hypertension, hyperlipidemia and dementia who presented to the hospital due to weakness and difficulty walking.  1. Acute CVA-patient was noted to have a positive MRI with a small nonhemorrhagic cerebellar infarct. -This is probably the cause of patient's difficulty walking and weakness. -Patient already has dense weakness on the right side from her previous stroke.  -Appreciate neurology input and continue current antiplatelet therapy with aspirin and Plavix. No further acute changes in management. -Appreciate physical therapy evaluation and patient will likely need short-term rehabilitation.  2. Essential hypertension-continue Norvasc.  3. Hypokalemia-Place on oral potassium supplements. Repeat level in the morning.  4. Hyperlipidemia-continue atorvastatin.  5. History of previous CVA-continue aspirin and Plavix, statin.    All the records are reviewed and case discussed with Care Management/Social Workerr. Management plans discussed with the patient, family and they are in agreement.  CODE STATUS: Full code  DVT Prophylaxis: Lovenox  TOTAL TIME TAKING CARE OF THIS PATIENT: 30 minutes.   POSSIBLE D/C IN 1-2 DAYS, DEPENDING ON CLINICAL CONDITION.   [place] SirenSAINANI,Malakhai Beitler J M.D on 02/17/2016 at 12:47 PM  Between 7am to 6pm - Pager - [phone]  After 6pm go to [email or web address] - password [email or web address] [facility]  [facility]  Office  [phone]  CC: Primary care physician; Dorothey BasemanAVID BRONSTEIN, MD

## 2016-02-17 NOTE — Clinical Social Work Note (Signed)
Clinical Social Work Assessment  Patient Details  Name: Laura Levy MRN: [id number] Date of Birth: December 23, 1940  Date of referral:  02/17/16               Reason for consult:  Facility Placement                Permission sought to share information with:  Chartered certified accountant granted to share information::  Yes, Verbal Permission Granted  Name::      [place]::   [place]   Relationship::     Contact Information:     Housing/Transportation Living arrangements for the past 2 months:  [place] of Information:  Patient, Adult Children Patient Interpreter Needed:  None Criminal Activity/Legal Involvement Pertinent to Current Situation/Hospitalization:  No - Comment as needed Significant Relationships:  Adult Children Lives with:  Adult Children Do you feel safe going back to the place where you live?  Yes Need for family participation in patient care:  Yes (Comment)  Care giving concerns:  Patient lives in [place] with her daughter Leana Roe.    Social Worker assessment / plan:  Holiday representative (CSW) received verbal consult from PT that recommendation is SNF. Patient is a new CVA. CSW met with patient and her son Laura Levy was at bedside. Patient was alert and oriented and was laying in the bed. CSW introduced self and explained role of CSW department. Patient reported that she lives in [place] with her daughter Leana Roe. Per Laura Levy he lives in [place]. CSW explained that PT is recommending SNF. Patient is agreeable to SNF search and stated that she has been to [facility] a few years ago. Patient prefers to return to [facility]. CSW explained that patient will need a 3 night inpatient qualifying stay in order for Medicare to pay for STR. Patient was admitted to inpatient status on 02/16/16. Patient and son verbalized their understanding.   FL2 complete and faxed out. CSW will continue to follow and assist  as needed.   Employment status:  Disabled (Comment on whether or not currently receiving Disability), Retired Forensic scientist:  Medicare PT Recommendations:  [place] / Referral to community resources:  [place]  Patient/Family's Response to care:  Patient and son are agreeable to [facility] and prefer [facility].   Patient/Family's Understanding of and Emotional Response to Diagnosis, Current Treatment, and Prognosis:  Patient was pleasant and thanked CSW for visit.   Emotional Assessment Appearance:  Appears stated age Attitude/Demeanor/Rapport:    Affect (typically observed):  Accepting, Adaptable, Pleasant Orientation:  Oriented to Self, Oriented to Place, Oriented to Situation Alcohol / Substance use:  Not Applicable Psych involvement (Current and /or in the community):  No (Comment)  Discharge Needs  Concerns to be addressed:  Discharge Planning Concerns Readmission within the last 30 days:  No Current discharge risk:  Dependent with Mobility Barriers to Discharge:  Continued Medical Work up   Elwyn Reach 02/17/2016, 2:47 PM

## 2016-02-17 NOTE — Clinical Social Work Placement (Signed)
   CLINICAL SOCIAL WORK PLACEMENT  NOTE  Date:  02/17/2016  Patient Details  Name: Montine CircleVera M Williamsen MRN: [id number] Date of Birth: 1941/03/16  Clinical Social Work is seeking post-discharge placement for this patient at the Skilled  Nursing Facility level of care (*CSW will initial, date and re-position this form in  chart as items are completed):  Yes   Patient/family provided with [place] Clinical Social Work Department's list of facilities offering this level of care within the geographic area requested by the patient (or if unable, by the patient's family).  Yes   Patient/family informed of their freedom to choose among providers that offer the needed level of care, that participate in Medicare, Medicaid or managed care program needed by the patient, have an available bed and are willing to accept the patient.  Yes   Patient/family informed of [place]'s ownership interest in [facility] and [facility], as well as of the fact that they are under no obligation to receive care at these facilities.  PASRR submitted to EDS on       PASRR number received on       Existing PASRR number confirmed on 02/17/16     FL2 transmitted to all facilities in geographic area requested by pt/family on 02/17/16     FL2 transmitted to all facilities within larger geographic area on       Patient informed that his/her managed care company has contracts with or will negotiate with certain facilities, including the following:            Patient/family informed of bed offers received.  Patient chooses bed at       Physician recommends and patient chooses bed at      Patient to be transferred to   on  .  Patient to be transferred to facility by       Patient family notified on   of transfer.  Name of family member notified:        PHYSICIAN       Additional Comment:    _______________________________________________ Haig ProphetMorgan, Shaughn Thomley G, LCSW 02/17/2016, 2:47 PM

## 2016-02-17 NOTE — Consult Note (Signed)
CC: unstable gait.   HPI: Laura Levy is an 75 y.o. female with a known history of Hypertension, history of stroke, dementia, history of residual R sided weakness due to stroke 2 yrs ago was brought in by family due to patient having difficulty with walking. 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    Not TPA candidate due to being outside the window.    Past Medical History  Diagnosis Date  . Hypertension   . Stroke (HCC)   . Dementia   . Edema leg     Past Surgical History  Procedure Laterality Date  . Cataract extraction w/phaco Right 03/27/2015    Procedure: CATARACT EXTRACTION PHACO AND INTRAOCULAR LENS PLACEMENT (IOC);  Surgeon: Galen Manila, MD;  Location: [facility] ORS;  Service: Ophthalmology;  Laterality: Right;  [place] 01:00 AP%28.9 CDE 17.42  . Cataract extraction w/phaco Left 05/29/2015    Procedure: CATARACT EXTRACTION PHACO AND INTRAOCULAR LENS PLACEMENT (IOC);  Surgeon: Galen Manila, MD;  Location: [facility] OR[facility];  Service: Ophthalmology;  Laterality: Left;  cassette lot# [id number]    [place] 00:27.7  AP 22.3 CDE  6.17    Family History  Problem Relation Age of Onset  . Congestive Heart Failure Mother     Social History:  reports that she has quit smoking. She does not have any smokeless tobacco history on file. She reports that she does not drink alcohol. Her drug history is not on file.  No Known Allergies  Medications: I have reviewed the patient's current medications.  ROS: History obtained from the patient  General ROS: negative for - chills, fatigue, fever, night sweats, weight gain or weight loss Psychological ROS: negative for - behavioral disorder, hallucinations, memory difficulties, mood swings or suicidal ideation Ophthalmic ROS: negative for - blurry vision, double vision, eye pain or loss of vision ENT ROS: negative for - epistaxis, nasal discharge, oral lesions, sore throat, tinnitus or vertigo Allergy and  Immunology ROS: negative for - hives or itchy/watery eyes Hematological and Lymphatic ROS: negative for - bleeding problems, bruising or swollen lymph nodes Endocrine ROS: negative for - galactorrhea, hair pattern changes, polydipsia/polyuria or temperature intolerance Respiratory ROS: negative for - cough, hemoptysis, shortness of breath or wheezing Cardiovascular ROS: negative for - chest pain, dyspnea on exertion, edema or irregular heartbeat Gastrointestinal ROS: negative for - abdominal pain, diarrhea, hematemesis, nausea/vomiting or stool incontinence Genito-Urinary ROS: negative for - dysuria, hematuria, incontinence or urinary frequency/urgency Musculoskeletal ROS: negative for - joint swelling or muscular weakness Neurological ROS: as noted in HPI Dermatological ROS: negative for rash and skin lesion changes  Physical Examination: Blood pressure 123/60, pulse 82, temperature 97.8 F (36.6 C), temperature source Oral, resp. rate 20, height  (1.626 m), weight 142 lb 1.6 oz (64.456 kg), SpO2 98 %.   Neurological Examination Mental Status: Alert, oriented, thought content appropriate.  Speech fluent. Cranial Nerves: II: Discs flat bilaterally; Visual fields grossly normal, pupils equal, round, reactive to light and accommodation III,IV, VI: ptosis not present, extra-ocular motions intact bilaterally V,VII: smile R facial droop VIII: hearing normal bilaterally IX,X: gag reflex present XI: bilateral shoulder shrug XII: midline tongue extension Motor: Right : Upper extremity   2/5    Left:     Upper extremity   5/5  Lower extremity   3/5     Lower extremity   5/5 Tone and bulk:normal tone throughout; no atrophy noted Sensory: Pinprick and light touch intact throughout, bilaterally  Deep Tendon Reflexes: hype reflexive R side.  Plantars: Right: upgoing   Left: downgoing Cerebellar: normal finger-to-nose, normal rapid alternating movements and normal heel-to-shin test Gait:  not tested.       Laboratory Studies:   Basic Metabolic Panel:  Recent Labs Lab 02/16/16 1005 02/17/16 0454  NA 139 140  K 3.4* 2.9*  CL 109 109  CO2 25 26  GLUCOSE 101* 92  BUN 14 11  CREATININE 1.17* 1.06*  CALCIUM 9.2 8.8*    Liver Function Tests: No results for input(s): AST, ALT, ALKPHOS, BILITOT, PROT, ALBUMIN in the last 168 hours. No results for input(s): LIPASE, AMYLASE in the last 168 hours. No results for input(s): AMMONIA in the last 168 hours.  CBC:  Recent Labs Lab 02/16/16 1005 02/17/16 0454  WBC 7.3 8.3  HGB 11.0* 11.4*  HCT 32.8* 33.5*  MCV 86.8 88.0  PLT 116* 106*    Cardiac Enzymes:  Recent Labs Lab 02/16/16 1005  TROPONINI 0.03    BNP: Invalid input(s): POCBNP  CBG: No results for input(s): GLUCAP in the last 168 hours.  Microbiology: Results for orders placed or performed during the hospital encounter of 01/01/16  Urine culture     Status: None   Collection Time: 01/01/16 10:51 AM  Result Value Ref Range Status   Specimen Description URINE, CLEAN CATCH  Final   Special Requests Normal  Final   Culture NO GROWTH 1 DAY  Final   Report Status 01/03/2016 FINAL  Final    Coagulation Studies: No results for input(s): LABPROT, INR in the last 72 hours.  Urinalysis:  Recent Labs Lab 02/16/16 1313  COLORURINE STRAW*  LABSPEC 1.008  PHURINE 7.0  GLUCOSEU NEGATIVE  HGBUR NEGATIVE  BILIRUBINUR NEGATIVE  KETONESUR TRACE*  PROTEINUR NEGATIVE  NITRITE NEGATIVE  LEUKOCYTESUR NEGATIVE    Lipid Panel:     Component Value Date/Time   CHOL 123 02/17/2016 0454   CHOL 116 08/13/2012 0424   TRIG 32 02/17/2016 0454   TRIG 47 08/13/2012 0424   HDL 39* 02/17/2016 0454   HDL 45 08/13/2012 0424   CHOLHDL 3.2 02/17/2016 0454   VLDL 6 02/17/2016 0454   VLDL 9 08/13/2012 0424   LDLCALC 78 02/17/2016 0454   LDLCALC 62 08/13/2012 0424    HgbA1C:  Lab Results  Component Value Date   HGBA1C 5.0 02/16/2016    Urine Drug  Screen:  No results found for: LABOPIA, COCAINSCRNUR, LABBENZ, AMPHETMU, THCU, LABBARB  Alcohol Level: No results for input(s): ETH in the last 168 hours.  Other results: EKG: normal EKG, normal sinus rhythm, unchanged from previous tracings.  Imaging: Ct Head Wo Contrast  02/16/2016  CLINICAL DATA:  Weakness since this morning. History of stroke 2 years ago with residual right-sided defects. EXAM: CT HEAD WITHOUT CONTRAST TECHNIQUE: Contiguous axial images were obtained from the base of the skull through the vertex without intravenous contrast. COMPARISON:  01/01/2016; 11/17/2014; 12/02/2013 FINDINGS: Similar findings of advanced atrophy with sulcal prominence. Re- demonstrated the sequela of prior bilateral MCA distribution infarcts, left greater than right, and a right PCA distribution infarct (image 14, series 10) with associated encephalomalacia and volume loss. Old infarcts involving the bilateral cerebellar hemispheres are also unchanged. Grossly unchanged rather extensive periventricular hypodensities compatible with microvascular ischemic disease. Given extensive background parenchymal abnormalities, there is no CT evidence of superimposed acute large territory infarct. No intraparenchymal or extra-axial mass or hemorrhage. Unchanged size a configuration of the ventricles and basilar cisterns. No midline shift. Intracranial  atherosclerosis. There is unchanged polypoid thickening of the left sphenoid sinus. The remaining paranasal sinuses and mastoid air cells are normally aerated. No air-fluid levels. Regional soft tissues appear normal. No displaced calvarial fracture. IMPRESSION: 1. No acute intracranial process. 2. Similar findings of advanced atrophy, bilateral MCA, right PCA and bilateral cerebellar hemisphere infarcts as detailed above. Electronically Signed   By: Simonne Come M.D.   On: 02/16/2016 15:41   Mr Brain Wo Contrast  02/16/2016  CLINICAL DATA:  Patient with known history of  hypertension, previous stroke, and dementia was brought in by family due to difficulty walking. EXAM: MRI HEAD WITHOUT CONTRAST TECHNIQUE: Multiplanar, multiecho pulse sequences of the brain and surrounding structures were obtained without intravenous contrast. COMPARISON:  CT head earlier today.  MR brain 12/03/2013. FINDINGS: Subcentimeter focus of acute infarction affects the RIGHT inferior cerebellum at the tonsillohemispheric junction. This is nonhemorrhagic. No other acute areas of infarction are seen. Chronic occlusion RIGHT vertebral artery. Adjacent RIGHT PICA appears patent. Flow voids appear maintained elsewhere. BILATERAL chronic cerebral and cerebellar infarcts, many sizable, as identified on previous CT and MR. Global atrophy. Hydrocephalus ex vacuo. Extensive white matter disease representing ischemic demyelination. No midline abnormality. BILATERAL cataract extraction. Sphenoid sinusitis. BILATERAL submucosal cysts in the adenoidal region could contribute to mastoid effusions. IMPRESSION: Subcentimeter acute, nonhemorrhagic, RIGHT inferior cerebellar infarct. Chronic occlusion RIGHT vertebral artery. Extensive atrophy and small vessel disease with numerous areas of significant cerebral cerebellar chronic infarction. Electronically Signed   By: Elsie Stain M.D.   On: 02/16/2016 19:26     Assessment/Plan: 75 y.o. female with a known history of Hypertension, history of stroke, dementia, history of residual R sided weakness due to stroke 2 yrs ago was brought in by family due to patient having difficulty with walking. 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    Pt was already on dual anti platelet therapy prior to admission.  As per her son who is at bedside, she was compliant with her medications She is in sinus rhythm.   I don't see an indication for TEE Stroke work up Cont dual anti plt therapy. Pt/Ot and possible rehab.  Laura Levy  02/17/2016, 12:41 PM

## 2016-02-17 NOTE — NC FL2 (Signed)
[place] MEDICAID FL2 LEVEL OF CARE SCREENING TOOL     IDENTIFICATION  Patient Name: SHANTICE MENGER Birthdate: 1941-02-12 Sex: female Admission Date (Current Location): 02/16/2016  [facility] and [place] Number:  [address]  ([id number]) Facility and Address:  [facility], [address], [place], [place] [address]      Provider Number: [id number]  Attending Physician Name and Address:  Houston Siren, MD  Relative Name and Phone Number:       Current Level of Care: Hospital Recommended Level of Care: Skilled Nursing Facility Prior Approval Number:    Date Approved/Denied:   PASRR Number:  ([id number] )  Discharge Plan: SNF    Current Diagnoses: Patient Active Problem List   Diagnosis Date Noted  . UTI (lower urinary tract infection) 01/01/2016  . Hypokalemia 01/01/2016  . Weakness 01/01/2016    Orientation RESPIRATION BLADDER Height & Weight     Self, Situation, Place  Normal Incontinent Weight: 142 lb 1.6 oz (64.456 kg) Height:   (162.6 cm)  BEHAVIORAL SYMPTOMS/MOOD NEUROLOGICAL BOWEL NUTRITION STATUS   (none )  (New CVA) Continent Diet (Diet: Heart Healthy )  AMBULATORY STATUS COMMUNICATION OF NEEDS Skin   Extensive Assist Verbally Normal                       Personal Care Assistance Level of Assistance  Bathing, Feeding, Dressing Bathing Assistance: Limited assistance Feeding assistance: Independent Dressing Assistance: Limited assistance     Functional Limitations Info  Sight, Speech, Hearing Sight Info: Adequate Hearing Info: Adequate Speech Info: Impaired    SPECIAL CARE FACTORS FREQUENCY  PT (By licensed PT), OT (By licensed OT), Speech therapy     PT Frequency:  (5) OT Frequency:  (5)            Contractures      Additional Factors Info  Code Status, Allergies Code Status Info:  (Full Code. ) Allergies Info:  (No Known Allergies. )           Current Medications (02/17/2016):  This is the  current hospital active medication list Current Facility-Administered Medications  Medication Dose Route Frequency Provider Last Rate Last Dose  . acetaminophen (TYLENOL) tablet 650 mg  650 mg Oral Q6H PRN Auburn Bilberry, MD       Or  . acetaminophen (TYLENOL) suppository 650 mg  650 mg Rectal Q6H PRN Auburn Bilberry, MD      . amLODipine (NORVASC) tablet 10 mg  10 mg Oral Daily Auburn Bilberry, MD   10 mg at 02/17/16 1053  . aspirin EC tablet 81 mg  81 mg Oral Daily Auburn Bilberry, MD   81 mg at 02/17/16 7846  . atorvastatin (LIPITOR) tablet 80 mg  80 mg Oral Daily Auburn Bilberry, MD   80 mg at 02/17/16 9629  . clopidogrel (PLAVIX) tablet 75 mg  75 mg Oral Daily Auburn Bilberry, MD   75 mg at 02/17/16 5284  . enoxaparin (LOVENOX) injection 40 mg  40 mg Subcutaneous Q24H Auburn Bilberry, MD   40 mg at 02/16/16 2114  . sodium chloride flush (NS) 0.9 % injection 3 mL  3 mL Intravenous Q12H Auburn Bilberry, MD   3 mL at 02/16/16 2233  . vitamin B-12 (CYANOCOBALAMIN) tablet 1,000 mcg  1,000 mcg Oral Daily Auburn Bilberry, MD   1,000 mcg at 02/17/16 1324     Discharge Medications: Please see discharge summary for a list of discharge medications.  Relevant Imaging Results:  Relevant Lab Results:   Additional Information  (SSN: [id number])  Haig ProphetMorgan, Debbie Bellucci G, LCSW

## 2016-02-17 NOTE — Evaluation (Signed)
Physical Therapy Evaluation Patient Details Name: Laura Levy MRN: [id number] DOB: 04/25/1941 Today's Date: 02/17/2016   History of Present Illness  75 y.o. female with a known history of Hypertension, history of stroke, dementia, history of edema of the leg. Was brought in by family due to patient having difficulty with walking.  Found to have acute CVA with radiology.  Clinical Impression  Pt with confusion ("I don't remember my name...") but able to interact with some consistency.  She reports that normally she is able to do in-home ambulation with QC and no assist and is surprised by how limited she was today as she was unable to even get to standing with max assist.     Follow Up Recommendations SNF    Equipment Recommendations   (hemiwalker?)    Recommendations for Other Services       Precautions / Restrictions Precautions Precautions: Fall Restrictions Weight Bearing Restrictions: No      Mobility  Bed Mobility Overal bed mobility: Needs Assistance Bed Mobility: Supine to Sit     Supine to sit: Max assist     General bed mobility comments: Pt displays poor balance sitting at EOB, needs assist to get to sitting and maintain balance.    Transfers Overall transfer level: Needs assistance Equipment used: Quad cane Transfers: Sit to/from Stand Sit to Stand: Total assist         General transfer comment: Pt is unable to get to standing despite 2 attempts with heavy assist.  She is unable to get to standing and does realize she is weaker than her baseline.   Ambulation/Gait             General Gait Details: unable/unsafe  Stairs            Wheelchair Mobility    Modified Rankin (Stroke Patients Only)       Balance                                             Pertinent Vitals/Pain Pain Assessment: No/denies pain    Home Living Family/patient expects to be discharged to:: Skilled nursing facility Living Arrangements:  Children Available Help at Discharge: Family;Available PRN/intermittently;Other (Comment) Type of Home: House                Prior Function Level of Independence: Needs assistance   Gait / Transfers Assistance Needed: Used quad cane     Comments: pt had assistance for ADLs from family     Hand Dominance        Extremity/Trunk Assessment   Upper Extremity Assessment: RUE deficits/detail RUE Deficits / Details: Pt with nearly no R UE AROM at baseline from previous CVA, L UE weak but grossly functional         Lower Extremity Assessment: Generalized weakness (R LE grossly functional, L LE with decreased coordination)         Communication   Communication: Expressive difficulties  Cognition Arousal/Alertness:  (baseline dementia) Behavior During Therapy: Anxious Overall Cognitive Status: Difficult to assess (baseline dementia)                      General Comments      Exercises        Assessment/Plan    PT Assessment Patient needs continued PT services  PT Diagnosis Generalized weakness;Hemiplegia dominant side;Difficulty walking  PT Problem List Decreased strength;Decreased range of motion;Decreased activity tolerance;Decreased balance;Decreased mobility;Decreased coordination;Decreased cognition;Decreased knowledge of use of DME;Decreased safety awareness  PT Treatment Interventions DME instruction;Gait training;Stair training;Functional mobility training;Therapeutic activities;Therapeutic exercise;Balance training;Neuromuscular re-education;Cognitive remediation;Patient/family education   PT Goals (Current goals can be found in the Care Plan section) Acute Rehab PT Goals Patient Stated Goal: wanting to get back to walking PT Goal Formulation: With patient Time For Goal Achievement: 03/02/16 Potential to Achieve Goals: Fair    Frequency 7X/week   Barriers to discharge        Co-evaluation               End of Session Equipment  Utilized During Treatment: Gait belt Activity Tolerance:  (severe weakness, unable to get to standing) Patient left: with bed alarm set;with call bell/phone within reach           Time: 0822-0849 PT Time Calculation (min) (ACUTE ONLY): 27 min   Charges:   PT Evaluation $PT Eval Moderate Complexity: 1 Procedure     PT G Codes:       [name], PT, DPT [phone]  [name] 02/17/2016, 11:20 AM

## 2016-02-18 LAB — POTASSIUM: Potassium: 3.5 mmol/L (ref 3.5–5.1)

## 2016-02-18 LAB — MAGNESIUM: Magnesium: 1.9 mg/dL (ref 1.7–2.4)

## 2016-02-18 MED ORDER — ONDANSETRON HCL 4 MG/2ML IJ SOLN
4.0000 mg | Freq: Four times a day (QID) | INTRAMUSCULAR | Status: DC | PRN
  Administered 2016-02-18: 19:00:00 4 mg via INTRAVENOUS
  Filled 2016-02-18 (×2): qty 2

## 2016-02-18 NOTE — Progress Notes (Signed)
[facility] - [place] at [facility]   PATIENT NAME: Laura PengVera Levy    MR#:  [id number]  DATE OF BIRTH:  1941/09/08  SUBJECTIVE:   Patient here due to difficulty walking and weakness. Patient's MRI is positive for a small nonhemorrhagic cerebellar CVA.  PT recommending SNF and will likely d/c there tomorrow. No acute events overnight.    REVIEW OF SYSTEMS:    Review of Systems  Constitutional: Negative for fever and chills.  HENT: Negative for congestion and tinnitus.   Eyes: Negative for blurred vision and double vision.  Respiratory: Negative for cough, shortness of breath and wheezing.   Cardiovascular: Negative for chest pain, orthopnea and PND.  Gastrointestinal: Negative for nausea, vomiting, abdominal pain and diarrhea.  Genitourinary: Negative for dysuria and hematuria.  Neurological: Negative for dizziness, sensory change and focal weakness.  All other systems reviewed and are negative.   Nutrition: Heart healthy Tolerating Diet: Yes Tolerating PT: Evaluation noted   DRUG ALLERGIES:  No Known Allergies  VITALS:  Blood pressure 127/78, pulse 87, temperature 98.3 F (36.8 C), temperature source Oral, resp. rate 18, height 5\' 4"  (1.626 m), weight 64.456 kg (142 lb 1.6 oz), SpO2 100 %.  PHYSICAL EXAMINATION:   Physical Exam  GENERAL:  75 y.o.-year-old patient lying in the bed in no acute distress.  EYES: Pupils equal, round, reactive to light and accommodation. No scleral icterus. Extraocular muscles intact.  HEENT: Head atraumatic, normocephalic. Oropharynx and nasopharynx clear.  NECK:  Supple, no jugular venous distention. No thyroid enlargement, no tenderness.  LUNGS: Normal breath sounds bilaterally, no wheezing, rales, rhonchi. No use of accessory muscles of respiration.  CARDIOVASCULAR: S1, S2 normal. No murmurs, rubs, or gallops.  ABDOMEN: Soft, nontender, nondistended. Bowel sounds present. No organomegaly or mass.  EXTREMITIES: No cyanosis,  clubbing or edema b/l.    NEUROLOGIC: Cranial nerves II through XII are intact. Right upper extremity contracted and weak due to previous CVA. Right lower extremity about a 1-2 out of 5 strength. Good motor function in the left side. No other focal deficits appreciated.  PSYCHIATRIC: The patient is alert and oriented x 3. Good affect SKIN: No obvious rash, lesion, or ulcer.    LABORATORY PANEL:   CBC  Recent Labs Lab 02/17/16 0454  WBC 8.3  HGB 11.4*  HCT 33.5*  PLT 106*   ------------------------------------------------------------------------------------------------------------------  Chemistries   Recent Labs Lab 02/17/16 0454 02/18/16 0515  NA 140  --   K 2.9* 3.5  CL 109  --   CO2 26  --   GLUCOSE 92  --   BUN 11  --   CREATININE 1.06*  --   CALCIUM 8.8*  --   MG  --  1.9   ------------------------------------------------------------------------------------------------------------------  Cardiac Enzymes  Recent Labs Lab 02/16/16 1005  TROPONINI 0.03   ------------------------------------------------------------------------------------------------------------------  RADIOLOGY:  Ct Head Wo Contrast  02/16/2016  CLINICAL DATA:  Weakness since this morning. History of stroke 2 years ago with residual right-sided defects. EXAM: CT HEAD WITHOUT CONTRAST TECHNIQUE: Contiguous axial images were obtained from the base of the skull through the vertex without intravenous contrast. COMPARISON:  01/01/2016; 11/17/2014; 12/02/2013 FINDINGS: Similar findings of advanced atrophy with sulcal prominence. Re- demonstrated the sequela of prior bilateral MCA distribution infarcts, left greater than right, and a right PCA distribution infarct (image 14, series 10) with associated encephalomalacia and volume loss. Old infarcts involving the bilateral cerebellar hemispheres are also unchanged. Grossly unchanged rather extensive periventricular hypodensities compatible with microvascular  ischemic disease. Given extensive background parenchymal abnormalities, there is no CT evidence of superimposed acute large territory infarct. No intraparenchymal or extra-axial mass or hemorrhage. Unchanged size a configuration of the ventricles and basilar cisterns. No midline shift. Intracranial atherosclerosis. There is unchanged polypoid thickening of the left sphenoid sinus. The remaining paranasal sinuses and mastoid air cells are normally aerated. No air-fluid levels. Regional soft tissues appear normal. No displaced calvarial fracture. IMPRESSION: 1. No acute intracranial process. 2. Similar findings of advanced atrophy, bilateral MCA, right PCA and bilateral cerebellar hemisphere infarcts as detailed above. Electronically Signed   By: Simonne Come M.D.   On: 02/16/2016 15:41   Mr Brain Wo Contrast  02/16/2016  CLINICAL DATA:  Patient with known history of hypertension, previous stroke, and dementia was brought in by family due to difficulty walking. EXAM: MRI HEAD WITHOUT CONTRAST TECHNIQUE: Multiplanar, multiecho pulse sequences of the brain and surrounding structures were obtained without intravenous contrast. COMPARISON:  CT head earlier today.  MR brain 12/03/2013. FINDINGS: Subcentimeter focus of acute infarction affects the RIGHT inferior cerebellum at the tonsillohemispheric junction. This is nonhemorrhagic. No other acute areas of infarction are seen. Chronic occlusion RIGHT vertebral artery. Adjacent RIGHT PICA appears patent. Flow voids appear maintained elsewhere. BILATERAL chronic cerebral and cerebellar infarcts, many sizable, as identified on previous CT and MR. Global atrophy. Hydrocephalus ex vacuo. Extensive white matter disease representing ischemic demyelination. No midline abnormality. BILATERAL cataract extraction. Sphenoid sinusitis. BILATERAL submucosal cysts in the adenoidal region could contribute to mastoid effusions. IMPRESSION: Subcentimeter acute, nonhemorrhagic, RIGHT  inferior cerebellar infarct. Chronic occlusion RIGHT vertebral artery. Extensive atrophy and small vessel disease with numerous areas of significant cerebral cerebellar chronic infarction. Electronically Signed   By: Elsie Stain M.D.   On: 02/16/2016 19:26     ASSESSMENT AND PLAN:   75 year old female with past medical history of previous CVA, hypertension, hyperlipidemia and dementia who presented to the hospital due to weakness and difficulty walking.  1. Acute CVA-patient was noted to have a positive MRI with a small nonhemorrhagic cerebellar infarct. -This is probably the cause of patient's difficulty walking and weakness. -Patient already has dense weakness on the right side from her previous stroke.  -Appreciate neurology input and continue current antiplatelet therapy with aspirin and Plavix. No further acute changes in management. -Appreciate physical therapy evaluation and patient will likely need short-term rehabilitation.  2. Essential hypertension-continue Norvasc.  3. Hypokalemia-improved w/ supplementation.   4. Hyperlipidemia-continue atorvastatin.  5. History of previous CVA-continue aspirin and Plavix, statin.  D/c to SNF tomorrow.   All the records are reviewed and case discussed with Care Management/Social Workerr. Management plans discussed with the patient, family and they are in agreement.  CODE STATUS: Full code  DVT Prophylaxis: Lovenox  TOTAL TIME TAKING CARE OF THIS PATIENT: 30 minutes.   POSSIBLE D/C IN 1-2 DAYS, DEPENDING ON CLINICAL CONDITION.   Houston Siren M.D on 02/18/2016 at 1:15 PM  Between 7am to 6pm - Pager - [phone]  After 6pm go to [email or web address] - password [email or web address] [facility]  [place] [facility]  Office  [phone]  CC: Primary care physician; Dorothey Baseman, MD

## 2016-02-18 NOTE — Plan of Care (Signed)
Problem: Tissue Perfusion: Goal: Cerebral tissue perfusion will improve applicable to all stroke diagnoses  Outcome: Progressing Color pale

## 2016-02-18 NOTE — Care Management Important Message (Signed)
Important Message  Patient Details  Name: Montine CircleVera M Leisure MRN: [id number] Date of Birth: 1941/01/09   Medicare Important Message Given:  Yes    Olegario MessierKathy A Calix Heinbaugh 02/18/2016, 11:09 AM

## 2016-02-18 NOTE — Care Management (Signed)
Admitted to [facility] with the diagnosis weakness. Lives with daughter, Laura Levy. Son is Shon HaleLeon [phone]). Uses a quad cane to aid in ambulation. [facility] comes to the home 2 hours a day 7 days a week. Last seen Dr. Terance HartBronstein about a month ago. Self feed. Needs help with baths and dressing. Physical therapy evaluation completed. Recommends skilled nursing facility. [facility]  Discharge tomorrow per Dr, Ian BushmanSainiani. Gwenette GreetBrenda S Malie Kashani RN MSN CCM Care Management [phone]

## 2016-02-18 NOTE — Progress Notes (Signed)
Physical Therapy Treatment Patient Details Name: Laura Levy MRN: [id number] DOB: September 17, 1941 Today's Date: 02/18/2016    History of Present Illness      PT Comments    Pt in bed awake, ready for session.  Wanting to get out of bed.  Bed mobility with min a x 1 to edge of bed.  Able to sit with supervision with minimal verbal cues to correct backwards lean.  Unsupported BLE AROM x 1o without loss of balance.  Attempted to stand at bedside x 5 but pt unable to fully stand even with height of bed raised.  Squat pivot transfer to chair at bedside - dependant.  PT with difficulty keeping feet on floor.  Writer needed to block feet to prevent them from sliding during transfer.  Participated in seated AROM again x 10.  Son in for treatment.   Follow Up Recommendations  SNF     Equipment Recommendations       Recommendations for Other Services       Precautions / Restrictions Precautions Precautions: Fall Restrictions Weight Bearing Restrictions: No    Mobility  Bed Mobility Overal bed mobility: Needs Assistance Bed Mobility: Supine to Sit     Supine to sit: Min assist     General bed mobility comments: Sitting EOB with cga x 1, unsafe to sit independantly  Transfers Overall transfer level: Needs assistance Equipment used: None Transfers: Stand Pivot Transfers Sit to Stand: Total assist            Ambulation/Gait                 Stairs            Wheelchair Mobility    Modified Rankin (Stroke Patients Only)       Balance                                    Cognition   Behavior During Therapy: WFL for tasks assessed/performed Overall Cognitive Status: Within Functional Limits for tasks assessed                      Exercises General Exercises - Lower Extremity Ankle Circles/Pumps: AROM Long Arc Quad: 20 reps Hip Flexion/Marching: 20 reps    General Comments        Pertinent Vitals/Pain Pain Assessment:  No/denies pain    Home Living                      Prior Function            PT Goals (current goals can now be found in the care plan section) Acute Rehab PT Goals Patient Stated Goal: wanting to get back to walking    Frequency  7X/week    PT Plan Current plan remains appropriate    Co-evaluation             End of Session     Patient left: with chair alarm set;in chair;with family/visitor present     Time: [id number] PT Time Calculation (min) (ACUTE ONLY): 25 min  Charges:  $Therapeutic Exercise: 8-22 mins $Therapeutic Activity: 8-22 mins                    G Codes:      Danielle DessSarah Lemoine Goyne, PTA 02/18/2016, 9:48 AM

## 2016-02-19 DIAGNOSIS — N183 Chronic kidney disease, stage 3 unspecified: Secondary | ICD-10-CM

## 2016-02-19 DIAGNOSIS — E785 Hyperlipidemia, unspecified: Secondary | ICD-10-CM

## 2016-02-19 DIAGNOSIS — I10 Essential (primary) hypertension: Secondary | ICD-10-CM

## 2016-02-19 DIAGNOSIS — F0390 Unspecified dementia without behavioral disturbance: Secondary | ICD-10-CM

## 2016-02-19 DIAGNOSIS — E559 Vitamin D deficiency, unspecified: Secondary | ICD-10-CM

## 2016-02-19 DIAGNOSIS — D696 Thrombocytopenia, unspecified: Secondary | ICD-10-CM

## 2016-02-19 DIAGNOSIS — R42 Dizziness and giddiness: Secondary | ICD-10-CM

## 2016-02-19 DIAGNOSIS — I639 Cerebral infarction, unspecified: Secondary | ICD-10-CM

## 2016-02-19 LAB — VITAMIN D 25 HYDROXY (VIT D DEFICIENCY, FRACTURES): VIT D 25 HYDROXY: 6.6 ng/mL — AB (ref 30.0–100.0)

## 2016-02-19 MED ORDER — VITAMIN D3 25 MCG (1000 UT) PO CHEW: 1000.0000 [IU] | CHEWABLE_TABLET | Freq: Every morning | ORAL | Status: AC

## 2016-02-19 NOTE — Discharge Summary (Signed)
[facility] - [place] at [facility]   PATIENT NAME: Laura Levy    MR#:  [id number]  DATE OF BIRTH:  12-17-1940  DATE OF ADMISSION:  02/16/2016 ADMITTING PHYSICIAN: Auburn Bilberry, MD  DATE OF DISCHARGE: No discharge date for patient encounter.  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 Hypertension   . Stroke (HCC)   . Dementia   . Edema leg     .pro HOSPITAL COURSE:   The patient is 75 year old African-American female who presents to the hospital with complaints of difficulty walking. CT of head in the emergency room showed no acute intracranial process, advanced atrophy, bilateral MCA, right PCA and bilateral cerebellar hemisphere infarcts. MRI of brain showed acute, nonhemorrhagic, right inferior cerebellar infarct, chronic occlusion of right vertebral artery, extensive atrophy and small vessel disease with numerous areas of significant cerebellar and cerebral chronic infarction. Patient was admitted to the hospital for further evaluation and treatment, she was seen by neurologist, who recommended to continue dual antiplatelet therapy. No further stroke workup was performed while in the hospital due to chronic cerebrovascular disease. Physical therapy saw patient in consultation and recommended skilled nursing facility for rehabilitation. Discussion by problem: 1. Acute CVA-patient was noted to have a positive MRI with a small nonhemorrhagic cerebellar infarct. -This is probably the cause of patient's  difficulty walking and weakness. -Patient already has dense weakness on the right side from her previous stroke.  -Appreciate neurology input and continue current antiplatelet therapy with aspirin and Plavix, high-dose of Lipitor. No further acute changes in management. -Appreciate physical therapy evaluation and patient will likely need short-term rehabilitation, where she will be discharged today.  2. Essential hypertension-continue Norvasc, blood pressure is well controlled.  3. Hypokalemia-improved w/ supplementation. Magnesium level was normal  4. Hyperlipidemia-continue atorvastatin.  5. History of previous CVA-continue aspirin and Plavix, statin.  6. Chronic renal insufficiency, stable 7 Vitamin D deficiency, initiate supplementation 8. Anemia, get Hemoccult 9. Thrombocytopenia, improved since March, 2017, follow as outpatient closely, follow for any bleeding, since patient is on aspirin/Plavix combination, which could predispose her to bleeding  DISCHARGE CONDITIONS:   Stable  CONSULTS OBTAINED:  Treatment Team:  Pauletta Browns, MD  DRUG ALLERGIES:  No Known Allergies  DISCHARGE MEDICATIONS:   Current Discharge Medication List    START taking these medications   Details  Cholecalciferol (VITAMIN D3) 1000 units CHEW Chew 1,000 Units by mouth every morning. Qty: 30 tablet, Refills: 6      CONTINUE these medications which have NOT CHANGED   Details  amLODipine (NORVASC) 10 MG tablet Take 10 mg by mouth daily.    aspirin EC 81 MG tablet Take 81 mg by mouth daily.    atorvastatin (LIPITOR) 80 MG tablet Take 80 mg by mouth daily.    clopidogrel (PLAVIX) 75 MG tablet Take 75 mg by mouth daily.    vitamin B-12 (CYANOCOBALAMIN) 1000 MCG tablet Take 1,000 mcg by mouth daily.         DISCHARGE INSTRUCTIONS:    Patient is to  follow-up with primary care physician as outpatient  If you experience worsening of your admission symptoms, develop shortness of  breath, life threatening emergency, suicidal or homicidal thoughts you must seek medical attention immediately by calling 911 or calling your MD immediately  if symptoms less severe.  You Must read complete instructions/literature along with all the possible adverse reactions/side effects for all the Medicines you take and that have been prescribed to you. Take any new Medicines after you have completely understood and accept all the possible adverse reactions/side effects.   Please note  You were cared for by a hospitalist during your hospital stay. If you have any questions about your discharge medications or the care you received while you were in the hospital after you are discharged, you can call the unit and asked to speak with the hospitalist on call if the hospitalist that took care of you is not available. Once you are discharged, your primary care physician will handle any further medical issues. Please note that NO REFILLS for any discharge medications will be authorized once you are discharged, as it is imperative that you return to your primary care physician (or establish a relationship with a primary care physician if you do not have one) for your aftercare needs so that they can reassess your need for medications and monitor your lab values.    Today   CHIEF COMPLAINT:   Chief Complaint  Patient presents with  . Weakness    HISTORY OF PRESENT ILLNESS:  Laura Levy  is a 75 y.o. female with a known history of essential hypertension, stroke with right-sided weakness, dementia, who presents to the hospital with difficulty walking. CT of head in the emergency room showed no acute intracranial process, advanced atrophy, bilateral MCA, right PCA and bilateral cerebellar hemisphere infarcts. MRI of brain showed acute, nonhemorrhagic, right inferior cerebellar infarct, chronic occlusion of right vertebral artery, extensive atrophy and small vessel disease with numerous areas of significant  cerebellar and cerebral chronic infarction. Patient was admitted to the hospital for further evaluation and treatment, she was seen by neurologist, who recommended to continue dual antiplatelet therapy. No further stroke workup was performed while in the hospital due to chronic cerebrovascular disease. Physical therapy saw patient in consultation and recommended skilled nursing facility for rehabilitation. Discussion by problem: 1. Acute CVA-patient was noted to have a positive MRI with a small nonhemorrhagic cerebellar infarct. -This is probably the cause of patient's difficulty walking and weakness. -Patient already has dense weakness on the right side from her previous stroke.  -Appreciate neurology input and continue current antiplatelet therapy with aspirin and Plavix, high-dose of Lipitor. No further acute changes in management. -Appreciate physical therapy evaluation and patient will likely need short-term rehabilitation, where she will be discharged today.  2. Essential hypertension-continue Norvasc, blood pressure is well controlled.  3. Hypokalemia-improved w/ supplementation. Magnesium level was normal  4. Hyperlipidemia-continue atorvastatin.  5. History of previous CVA-continue aspirin and Plavix, statin.  6. Chronic renal insufficiency, stable 7 Vitamin D deficiency, initiate supplementation 8. Anemia, get Hemoccult 9. Thrombocytopenia, improved since March, 2017, follow as outpatient closely, follow for any bleeding, since patient is on aspirin/Plavix combination, which could predispose her to bleeding    VITAL SIGNS:  Blood pressure 109/61, pulse 81, temperature 98.6 F (37 C), temperature source Oral, resp. rate 18, height 5\' 4"  (1.626 m), weight 64.456 kg (142 lb 1.6 oz), SpO2 97 %.  I/O:    Intake/Output Summary (Last 24 hours) at 02/19/16 1007  Last data filed at 02/19/16 0900  Gross per 24 hour  Intake    240 ml  Output      0 ml  Net    240 ml    PHYSICAL  EXAMINATION:  GENERAL:  75 y.o.-year-old patient lying in the bed with no acute distress.  EYES: Pupils equal, round, reactive to light and accommodation. No scleral icterus. Extraocular muscles intact.  HEENT: Head atraumatic, normocephalic. Oropharynx and nasopharynx clear.  NECK:  Supple, no jugular venous distention. No thyroid enlargement, no tenderness.  LUNGS: Normal breath sounds bilaterally, no wheezing, rales,rhonchi or crepitation. No use of accessory muscles of respiration.  CARDIOVASCULAR: S1, S2 normal. No murmurs, rubs, or gallops.  ABDOMEN: Soft, non-tender, non-distended. Bowel sounds present. No organomegaly or mass.  EXTREMITIES: No pedal edema, cyanosis, or clubbing.  NEUROLOGIC: Cranial nerves II through XII are intact. Muscle strength 5/5 in all extremities. Sensation intact. Gait not checked.  PSYCHIATRIC: The patient is alert and oriented x 3.  SKIN: No obvious rash, lesion, or ulcer.   DATA REVIEW:   CBC  Recent Labs Lab 02/17/16 0454  WBC 8.3  HGB 11.4*  HCT 33.5*  PLT 106*    Chemistries   Recent Labs Lab 02/17/16 0454 02/18/16 0515  NA 140  --   K 2.9* 3.5  CL 109  --   CO2 26  --   GLUCOSE 92  --   BUN 11  --   CREATININE 1.06*  --   CALCIUM 8.8*  --   MG  --  1.9    Cardiac Enzymes  Recent Labs Lab 02/16/16 1005  TROPONINI 0.03    Microbiology Results  Results for orders placed or performed during the hospital encounter of 01/01/16  Urine culture     Status: None   Collection Time: 01/01/16 10:51 AM  Result Value Ref Range Status   Specimen Description URINE, CLEAN CATCH  Final   Special Requests Normal  Final   Culture NO GROWTH 1 DAY  Final   Report Status 01/03/2016 FINAL  Final    RADIOLOGY:  No results found.  EKG:   Orders placed or performed during the hospital encounter of 02/16/16  . EKG 12-Lead  . EKG 12-Lead  . EKG 12-Lead  . EKG 12-Lead      Management plans discussed with the patient, family and  they are in agreement.  CODE STATUS:     Code Status Orders        Start     Ordered   02/16/16 1704  Full code   Continuous     02/16/16 1704    Code Status History    Date Active Date Inactive Code Status Order ID Comments User Context   01/01/2016  3:22 PM 01/03/2016  9:03 PM Full Code [id number]  Shaune Pollack, MD Inpatient      TOTAL TIME TAKING CARE OF THIS PATIENT: 40  minutes.    Katharina Caper M.D on 02/19/2016 at 10:07 AM  Between 7am to 6pm - Pager - [phone]  After 6pm go to [email or web address] - password [email or web address] [facility]  [place] [facility]  Office  [phone]  CC: Primary care physician; Dorothey Baseman, MD

## 2016-02-19 NOTE — Clinical Social Work Placement (Signed)
   CLINICAL SOCIAL WORK PLACEMENT  NOTE  Date:  02/19/2016  Patient Details  Name: Laura Levy MRN: [id number] Date of Birth: 09-02-1941  Clinical Social Work is seeking post-discharge placement for this patient at the Skilled  Nursing Facility level of care (*CSW will initial, date and re-position this form in  chart as items are completed):  Yes   Patient/family provided with [place] Clinical Social Work Department's list of facilities offering this level of care within the geographic area requested by the patient (or if unable, by the patient's family).  Yes   Patient/family informed of their freedom to choose among providers that offer the needed level of care, that participate in Medicare, Medicaid or managed care program needed by the patient, have an available bed and are willing to accept the patient.  Yes   Patient/family informed of [place]'s ownership interest in [facility] and [facility], as well as of the fact that they are under no obligation to receive care at these facilities.  PASRR submitted to EDS on       PASRR number received on       Existing PASRR number confirmed on 02/17/16     FL2 transmitted to all facilities in geographic area requested by pt/family on 02/17/16     FL2 transmitted to all facilities within larger geographic area on       Patient informed that his/her managed care company has contracts with or will negotiate with certain facilities, including the following:        Yes   Patient/family informed of bed offers received.  Patient [facility] bed at [facility] [place]     Physician recommends and patient chooses bed at  [facility](SNF)    Patient to be transferred to [facility] on 02/19/16.  Patient to be transferred to facility by [facility]     Patient family notified on 02/19/16 of transfer.  Name of family member notified:  Son, [name]     PHYSICIAN       Additional Comment:     _______________________________________________ Dede QuerySarah Daichi Moris, LCSW 02/19/2016, 3:26 PM

## 2016-02-19 NOTE — Progress Notes (Signed)
Report called to Duwayne Heckanielle, Charity fundraiserN at [facility].   EMS contacted for transport.  Family given doctor's note.  Orson Apeanielle Theresa Wedel, RN

## 2016-02-19 NOTE — Progress Notes (Signed)
         To Whom It May Concern:        Mrs. Laura Levy was hospitalized with life-threatening illness at [facility] 02/16/2016 through 02/19/2016. Patient's son was present at the hospital daily and helping his mother during her hospitalization. Please excuse him from work due to these circumstances. Thank you for your understanding.        Sincerely,  Katharina Caperima Kynley Metzger, MD

## 2016-02-19 NOTE — Clinical Social Work Note (Signed)
Pt is ready for discharge today. CSW presented bed offers. Pt and son chose [facility]. CSW updated facility. Facility is able to accept pt as they have received discharge information. Pt and son are in agreement for discharge plan. RN to call report and EMS will provide transportation. CSW is signing off as no further needs identified.   Dede QuerySarah Keelee Yankey, MSW, LCSW  Clinical Social Worker  [phone]

## 2016-02-19 NOTE — Progress Notes (Signed)
Physical Therapy Treatment Patient Details Name: Laura Levy Asselin MRN: [id number] DOB: 1941/06/23 Today's Date: 02/19/2016    History of Present Illness 75 y.o. female with a known history of Hypertension, history of stroke, dementia, history of edema of the leg. Was brought in by family due to patient having difficulty with walking.  Found to have acute CVA with radiology.    PT Comments    Pt in bed.  Requesting to get out of bed into chair.  Supine to edge of bed with min a x 1.  Pt was able to stand fully at bedside today with mod a x 1.  She transferred to chair at bedside with mod a x 1.  On second standing attempt she stood for 70 seconds with min a x 1 once upright.  She does c/o pain right toes that are tender to touch.  Pt pleased with her progress today.  D/C planned for today to [facility].     Follow Up Recommendations        Equipment Recommendations       Recommendations for Other Services       Precautions / Restrictions Precautions Precautions: Fall Restrictions Weight Bearing Restrictions: No    Mobility  Bed Mobility Overal bed mobility: Needs Assistance Bed Mobility: Supine to Sit     Supine to sit: Min assist     General bed mobility comments: Sitting EOB with cga x 1, unsafe to sit independantly  Transfers Overall transfer level: Needs assistance Equipment used: None Transfers: Stand Pivot Transfers Sit to Stand: Mod assist Stand pivot transfers: Mod assist       General transfer comment: significant improvement today.  Able to stand fully with less assist.  Ambulation/Gait                 Stairs            Wheelchair Mobility    Modified Rankin (Stroke Patients Only)       Balance                                    Cognition   Behavior During Therapy: WFL for tasks assessed/performed Overall Cognitive Status: Within Functional Limits for tasks assessed                      Exercises       General Comments        Pertinent Vitals/Pain Pain Assessment: 0-10 Pain Score: 5  Pain Location: right foot - toes Pain Descriptors / Indicators: Tender    Home Living                      Prior Function            PT Goals (current goals can now be found in the care plan section) Acute Rehab PT Goals Patient Stated Goal: wanting to get back to walking    Frequency       PT Plan      Co-evaluation             End of Session Equipment Utilized During Treatment: Gait belt Activity Tolerance: Patient tolerated treatment well Patient left: with chair alarm set;in chair;with family/visitor present     Time: [id number] PT Time Calculation (min) (ACUTE ONLY): 17 min  Charges:  $Therapeutic Activity: 8-22 mins  G Codes:      Danielle Dess, PTA 02/19/2016, 10:55 AM

## 2016-04-04 ENCOUNTER — Inpatient Hospital Stay (HOSPITAL_COMMUNITY): Payer: Medicare Other

## 2016-04-04 ENCOUNTER — Encounter: Payer: Self-pay | Admitting: Emergency Medicine

## 2016-04-04 ENCOUNTER — Emergency Department: Payer: Medicare Other

## 2016-04-04 ENCOUNTER — Emergency Department
Admission: EM | Admit: 2016-04-04 | Discharge: 2016-04-04 | Disposition: A | Discharge status: Other Acute Inpatient Hospital | Payer: Medicare Other | Attending: Internal Medicine | Admitting: Internal Medicine

## 2016-04-04 ENCOUNTER — Inpatient Hospital Stay (HOSPITAL_COMMUNITY)
Admission: AD | Admit: 2016-04-04 | Discharge: 2016-05-03 | DRG: 004 | Disposition: E | Payer: Medicare Other | Source: Other Acute Inpatient Hospital | Attending: Internal Medicine | Admitting: Internal Medicine

## 2016-04-04 DIAGNOSIS — N179 Acute kidney failure, unspecified: Secondary | ICD-10-CM | POA: Diagnosis present

## 2016-04-04 DIAGNOSIS — I129 Hypertensive chronic kidney disease with stage 1 through stage 4 chronic kidney disease, or unspecified chronic kidney disease: Secondary | ICD-10-CM | POA: Diagnosis present

## 2016-04-04 DIAGNOSIS — R569 Unspecified convulsions: Secondary | ICD-10-CM | POA: Diagnosis not present

## 2016-04-04 DIAGNOSIS — Z86718 Personal history of other venous thrombosis and embolism: Secondary | ICD-10-CM

## 2016-04-04 DIAGNOSIS — Z8249 Family history of ischemic heart disease and other diseases of the circulatory system: Secondary | ICD-10-CM | POA: Diagnosis not present

## 2016-04-04 DIAGNOSIS — I4891 Unspecified atrial fibrillation: Secondary | ICD-10-CM | POA: Diagnosis present

## 2016-04-04 DIAGNOSIS — Z7902 Long term (current) use of antithrombotics/antiplatelets: Secondary | ICD-10-CM

## 2016-04-04 DIAGNOSIS — I6789 Other cerebrovascular disease: Secondary | ICD-10-CM | POA: Diagnosis not present

## 2016-04-04 DIAGNOSIS — R4182 Altered mental status, unspecified: Secondary | ICD-10-CM

## 2016-04-04 DIAGNOSIS — G40901 Epilepsy, unspecified, not intractable, with status epilepticus: Secondary | ICD-10-CM

## 2016-04-04 DIAGNOSIS — Z9842 Cataract extraction status, left eye: Secondary | ICD-10-CM

## 2016-04-04 DIAGNOSIS — I10 Essential (primary) hypertension: Secondary | ICD-10-CM | POA: Insufficient documentation

## 2016-04-04 DIAGNOSIS — D696 Thrombocytopenia, unspecified: Secondary | ICD-10-CM | POA: Diagnosis not present

## 2016-04-04 DIAGNOSIS — R9089 Other abnormal findings on diagnostic imaging of central nervous system: Secondary | ICD-10-CM

## 2016-04-04 DIAGNOSIS — Z79899 Other long term (current) drug therapy: Secondary | ICD-10-CM | POA: Diagnosis not present

## 2016-04-04 DIAGNOSIS — N183 Chronic kidney disease, stage 3 unspecified: Secondary | ICD-10-CM | POA: Diagnosis present

## 2016-04-04 DIAGNOSIS — I6340 Cerebral infarction due to embolism of unspecified cerebral artery: Secondary | ICD-10-CM | POA: Diagnosis present

## 2016-04-04 DIAGNOSIS — I639 Cerebral infarction, unspecified: Secondary | ICD-10-CM | POA: Diagnosis not present

## 2016-04-04 DIAGNOSIS — R579 Shock, unspecified: Secondary | ICD-10-CM | POA: Diagnosis present

## 2016-04-04 DIAGNOSIS — Z87891 Personal history of nicotine dependence: Secondary | ICD-10-CM | POA: Diagnosis not present

## 2016-04-04 DIAGNOSIS — F0390 Unspecified dementia without behavioral disturbance: Secondary | ICD-10-CM | POA: Diagnosis present

## 2016-04-04 DIAGNOSIS — T85598A Other mechanical complication of other gastrointestinal prosthetic devices, implants and grafts, initial encounter: Secondary | ICD-10-CM

## 2016-04-04 DIAGNOSIS — Z930 Tracheostomy status: Secondary | ICD-10-CM

## 2016-04-04 DIAGNOSIS — Z961 Presence of intraocular lens: Secondary | ICD-10-CM | POA: Diagnosis present

## 2016-04-04 DIAGNOSIS — M311 Thrombotic microangiopathy: Secondary | ICD-10-CM

## 2016-04-04 DIAGNOSIS — B9620 Unspecified Escherichia coli [E. coli] as the cause of diseases classified elsewhere: Secondary | ICD-10-CM | POA: Diagnosis not present

## 2016-04-04 DIAGNOSIS — R531 Weakness: Secondary | ICD-10-CM | POA: Diagnosis not present

## 2016-04-04 DIAGNOSIS — N390 Urinary tract infection, site not specified: Secondary | ICD-10-CM | POA: Diagnosis not present

## 2016-04-04 DIAGNOSIS — R17 Unspecified jaundice: Secondary | ICD-10-CM

## 2016-04-04 DIAGNOSIS — R112 Nausea with vomiting, unspecified: Secondary | ICD-10-CM

## 2016-04-04 DIAGNOSIS — Z9841 Cataract extraction status, right eye: Secondary | ICD-10-CM

## 2016-04-04 DIAGNOSIS — M3119 Other thrombotic microangiopathy: Secondary | ICD-10-CM | POA: Insufficient documentation

## 2016-04-04 DIAGNOSIS — E876 Hypokalemia: Secondary | ICD-10-CM | POA: Diagnosis present

## 2016-04-04 DIAGNOSIS — R930 Abnormal findings on diagnostic imaging of skull and head, not elsewhere classified: Secondary | ICD-10-CM

## 2016-04-04 DIAGNOSIS — I638 Other cerebral infarction: Secondary | ICD-10-CM | POA: Diagnosis not present

## 2016-04-04 DIAGNOSIS — K8020 Calculus of gallbladder without cholecystitis without obstruction: Secondary | ICD-10-CM | POA: Insufficient documentation

## 2016-04-04 DIAGNOSIS — R34 Anuria and oliguria: Secondary | ICD-10-CM | POA: Diagnosis not present

## 2016-04-04 DIAGNOSIS — I69359 Hemiplegia and hemiparesis following cerebral infarction affecting unspecified side: Secondary | ICD-10-CM | POA: Diagnosis not present

## 2016-04-04 DIAGNOSIS — J449 Chronic obstructive pulmonary disease, unspecified: Secondary | ICD-10-CM | POA: Diagnosis present

## 2016-04-04 DIAGNOSIS — Z6825 Body mass index (BMI) 25.0-25.9, adult: Secondary | ICD-10-CM

## 2016-04-04 DIAGNOSIS — J9690 Respiratory failure, unspecified, unspecified whether with hypoxia or hypercapnia: Secondary | ICD-10-CM

## 2016-04-04 DIAGNOSIS — I6501 Occlusion and stenosis of right vertebral artery: Secondary | ICD-10-CM | POA: Diagnosis present

## 2016-04-04 DIAGNOSIS — I214 Non-ST elevation (NSTEMI) myocardial infarction: Secondary | ICD-10-CM | POA: Diagnosis not present

## 2016-04-04 DIAGNOSIS — E860 Dehydration: Secondary | ICD-10-CM | POA: Diagnosis present

## 2016-04-04 DIAGNOSIS — J9601 Acute respiratory failure with hypoxia: Secondary | ICD-10-CM | POA: Diagnosis present

## 2016-04-04 DIAGNOSIS — Z4659 Encounter for fitting and adjustment of other gastrointestinal appliance and device: Secondary | ICD-10-CM

## 2016-04-04 DIAGNOSIS — I469 Cardiac arrest, cause unspecified: Secondary | ICD-10-CM | POA: Diagnosis not present

## 2016-04-04 DIAGNOSIS — E785 Hyperlipidemia, unspecified: Secondary | ICD-10-CM | POA: Diagnosis present

## 2016-04-04 DIAGNOSIS — N19 Unspecified kidney failure: Secondary | ICD-10-CM

## 2016-04-04 DIAGNOSIS — I4892 Unspecified atrial flutter: Secondary | ICD-10-CM | POA: Diagnosis present

## 2016-04-04 DIAGNOSIS — J9600 Acute respiratory failure, unspecified whether with hypoxia or hypercapnia: Secondary | ICD-10-CM | POA: Diagnosis not present

## 2016-04-04 DIAGNOSIS — G4089 Other seizures: Secondary | ICD-10-CM

## 2016-04-04 DIAGNOSIS — D6489 Other specified anemias: Secondary | ICD-10-CM | POA: Diagnosis not present

## 2016-04-04 DIAGNOSIS — I701 Atherosclerosis of renal artery: Secondary | ICD-10-CM | POA: Diagnosis present

## 2016-04-04 DIAGNOSIS — Z7982 Long term (current) use of aspirin: Secondary | ICD-10-CM

## 2016-04-04 DIAGNOSIS — Z792 Long term (current) use of antibiotics: Secondary | ICD-10-CM | POA: Insufficient documentation

## 2016-04-04 DIAGNOSIS — D649 Anemia, unspecified: Secondary | ICD-10-CM | POA: Diagnosis present

## 2016-04-04 DIAGNOSIS — Z452 Encounter for adjustment and management of vascular access device: Secondary | ICD-10-CM

## 2016-04-04 DIAGNOSIS — R64 Cachexia: Secondary | ICD-10-CM | POA: Diagnosis present

## 2016-04-04 DIAGNOSIS — G9340 Encephalopathy, unspecified: Secondary | ICD-10-CM | POA: Diagnosis not present

## 2016-04-04 DIAGNOSIS — Z0189 Encounter for other specified special examinations: Secondary | ICD-10-CM

## 2016-04-04 HISTORY — DX: Other specified bacterial intestinal infections: A04.8

## 2016-04-04 LAB — PATHOLOGIST SMEAR REVIEW

## 2016-04-04 LAB — URINALYSIS COMPLETE WITH MICROSCOPIC (ARMC ONLY)
BACTERIA UA: NONE SEEN
BILIRUBIN URINE: NEGATIVE
Glucose, UA: NEGATIVE mg/dL
KETONES UR: NEGATIVE mg/dL
LEUKOCYTES UA: NEGATIVE
NITRITE: NEGATIVE
PH: 6 (ref 5.0–8.0)
PROTEIN: 30 mg/dL — AB
Specific Gravity, Urine: 1.009 (ref 1.005–1.030)

## 2016-04-04 LAB — CBC WITH DIFFERENTIAL/PLATELET
BASOS PCT: 0 %
Basophils Absolute: 0.1 10*3/uL (ref 0–0.1)
Basophils Absolute: 0 10*3/uL (ref 0.0–0.1)
EOS ABS: 0.1 10*3/uL (ref 0–0.7)
EOS ABS: 0.1 10*3/uL (ref 0.0–0.7)
Eosinophils Relative: 1 %
HCT: 25.8 % — ABNORMAL LOW (ref 35.0–47.0)
HEMATOCRIT: 29.7 % — AB (ref 36.0–46.0)
Hemoglobin: 8.9 g/dL — ABNORMAL LOW (ref 12.0–16.0)
Hemoglobin: 9.6 g/dL — ABNORMAL LOW (ref 12.0–15.0)
LYMPHS PCT: 16 %
Lymphocytes Relative: 16 %
Lymphs Abs: 1.8 10*3/uL (ref 1.0–3.6)
Lymphs Abs: 2 10*3/uL (ref 0.7–4.0)
MCH: 29.9 pg (ref 26.0–34.0)
MCH: 28.8 pg (ref 26.0–34.0)
MCHC: 34.5 g/dL (ref 32.0–36.0)
MCHC: 32.3 g/dL (ref 30.0–36.0)
MCV: 86.6 fL (ref 80.0–100.0)
MCV: 89.2 fL (ref 78.0–100.0)
MONO ABS: 1.1 10*3/uL — AB (ref 0.2–0.9)
Monocytes Absolute: 1 10*3/uL (ref 0.1–1.0)
Monocytes Relative: 8 %
NEUTROS ABS: 9.5 10*3/uL — AB (ref 1.7–7.7)
Neutro Abs: 7.9 10*3/uL — ABNORMAL HIGH (ref 1.4–6.5)
Neutrophils Relative %: 72 %
Neutrophils Relative %: 75 %
PLATELETS: 14 10*3/uL — AB (ref 150–400)
Platelets: 13 10*3/uL — CL (ref 150–440)
RBC: 2.98 MIL/uL — ABNORMAL LOW (ref 3.80–5.20)
RBC: 3.33 MIL/uL — ABNORMAL LOW (ref 3.87–5.11)
RDW: 15.6 % — AB (ref 11.5–14.5)
RDW: 16.2 % — ABNORMAL HIGH (ref 11.5–15.5)
WBC: 11 10*3/uL (ref 3.6–11.0)
WBC: 12.6 10*3/uL — ABNORMAL HIGH (ref 4.0–10.5)

## 2016-04-04 LAB — COMPREHENSIVE METABOLIC PANEL
ALBUMIN: 3.8 g/dL (ref 3.5–5.0)
ALK PHOS: 52 U/L (ref 38–126)
ALT: 20 U/L (ref 14–54)
ALT: 23 U/L (ref 14–54)
AST: 56 U/L — ABNORMAL HIGH (ref 15–41)
AST: 67 U/L — AB (ref 15–41)
Albumin: 3.3 g/dL — ABNORMAL LOW (ref 3.5–5.0)
Alkaline Phosphatase: 53 U/L (ref 38–126)
Anion gap: 8 (ref 5–15)
Anion gap: 12 (ref 5–15)
BILIRUBIN TOTAL: 2.5 mg/dL — AB (ref 0.3–1.2)
BUN: 20 mg/dL (ref 6–20)
BUN: 14 mg/dL (ref 6–20)
CALCIUM: 9 mg/dL (ref 8.9–10.3)
CHLORIDE: 108 mmol/L (ref 101–111)
CO2: 23 mmol/L (ref 22–32)
CO2: 20 mmol/L — ABNORMAL LOW (ref 22–32)
CREATININE: 1.36 mg/dL — AB (ref 0.44–1.00)
CREATININE: 1.35 mg/dL — AB (ref 0.44–1.00)
Calcium: 9.1 mg/dL (ref 8.9–10.3)
Chloride: 108 mmol/L (ref 101–111)
GFR calc Af Amer: 44 mL/min — ABNORMAL LOW (ref >60)
GFR calc Af Amer: 44 mL/min — ABNORMAL LOW (ref >60)
GFR, EST NON AFRICAN AMERICAN: 38 mL/min — AB (ref >60)
GFR, EST NON AFRICAN AMERICAN: 38 mL/min — AB (ref >60)
Glucose, Bld: 120 mg/dL — ABNORMAL HIGH (ref 65–99)
Glucose, Bld: 83 mg/dL (ref 65–99)
POTASSIUM: 3.7 mmol/L (ref 3.5–5.1)
POTASSIUM: 4.2 mmol/L (ref 3.5–5.1)
SODIUM: 139 mmol/L (ref 135–145)
Sodium: 140 mmol/L (ref 135–145)
TOTAL PROTEIN: 7.4 g/dL (ref 6.5–8.1)
Total Bilirubin: 3 mg/dL — ABNORMAL HIGH (ref 0.3–1.2)
Total Protein: 7.6 g/dL (ref 6.5–8.1)

## 2016-04-04 LAB — ABO/RH: ABO/RH(D): AB POS

## 2016-04-04 LAB — TROPONIN I
TROPONIN I: 2.16 ng/mL — AB (ref <0.031)
Troponin I: 3.43 ng/mL (ref <0.031)

## 2016-04-04 LAB — LIPASE, BLOOD: Lipase: 26 U/L (ref 11–51)

## 2016-04-04 LAB — APTT: aPTT: 33 seconds (ref 24–37)

## 2016-04-04 LAB — LACTATE DEHYDROGENASE: LDH: 1007 U/L — ABNORMAL HIGH (ref 98–192)

## 2016-04-04 LAB — FIBRINOGEN: FIBRINOGEN: 382 mg/dL (ref 204–475)

## 2016-04-04 LAB — PROTIME-INR
INR: 1.4 (ref 0.00–1.49)
Prothrombin Time: 17.3 seconds — ABNORMAL HIGH (ref 11.6–15.2)

## 2016-04-04 MED ORDER — SODIUM CHLORIDE 0.9 % IV SOLN
1000.0000 mg | Freq: Two times a day (BID) | INTRAVENOUS | Status: DC
  Administered 2016-04-05 – 2016-04-13 (×18): 1000 mg via INTRAVENOUS
  Filled 2016-04-04 (×21): qty 10

## 2016-04-04 MED ORDER — LORAZEPAM 2 MG/ML IJ SOLN
INTRAMUSCULAR | Status: AC
  Administered 2016-04-04: 1 mg
  Filled 2016-04-04: qty 1

## 2016-04-04 MED ORDER — FENTANYL CITRATE (PF) 100 MCG/2ML IJ SOLN
50.0000 ug | Freq: Once | INTRAMUSCULAR | Status: AC
  Administered 2016-04-04: 50 ug via INTRAVENOUS

## 2016-04-04 MED ORDER — ACETAMINOPHEN 325 MG PO TABS: 650.0000 mg | ORAL_TABLET | ORAL | Status: DC | PRN

## 2016-04-04 MED ORDER — ACD FORMULA A 0.73-2.45-2.2 GM/100ML VI SOLN
Status: AC
  Administered 2016-04-04: 2246 mL via INTRAVENOUS_CENTRAL
  Filled 2016-04-04: qty 500

## 2016-04-04 MED ORDER — DIPHENHYDRAMINE HCL 25 MG PO CAPS: 25.0000 mg | ORAL_CAPSULE | Freq: Four times a day (QID) | ORAL | Status: DC | PRN

## 2016-04-04 MED ORDER — SODIUM CHLORIDE 0.9 % IV BOLUS (SEPSIS)
1000.0000 mL | Freq: Once | INTRAVENOUS | Status: AC
  Administered 2016-04-04: 1000 mL via INTRAVENOUS

## 2016-04-04 MED ORDER — SODIUM CHLORIDE 0.9 % IV SOLN
Freq: Once | INTRAVENOUS | Status: AC
  Administered 2016-04-04: 21:00:00 via INTRAVENOUS

## 2016-04-04 MED ORDER — ANTICOAGULANT SODIUM CITRATE 4% (200MG/5ML) IV SOLN
5.0000 mL | Freq: Once | Status: AC
  Administered 2016-04-05: 5 mL
  Filled 2016-04-04: qty 250

## 2016-04-04 MED ORDER — ACD FORMULA A 0.73-2.45-2.2 GM/100ML VI SOLN
500.0000 mL | Status: DC
  Filled 2016-04-04: qty 500

## 2016-04-04 MED ORDER — ONDANSETRON HCL 4 MG/2ML IJ SOLN
4.0000 mg | Freq: Once | INTRAMUSCULAR | Status: AC
  Administered 2016-04-04: 4 mg via INTRAVENOUS
  Filled 2016-04-04: qty 2

## 2016-04-04 MED ORDER — SODIUM CHLORIDE 0.9 % IV BOLUS (SEPSIS): 1000.0000 mL | Freq: Once | INTRAVENOUS | Status: DC

## 2016-04-04 MED ORDER — SENNOSIDES-DOCUSATE SODIUM 8.6-50 MG PO TABS: 1.0000 | ORAL_TABLET | Freq: Every evening | ORAL | Status: DC | PRN

## 2016-04-04 MED ORDER — ETOMIDATE 2 MG/ML IV SOLN
20.0000 mg | Freq: Once | INTRAVENOUS | Status: AC
  Administered 2016-04-04: 20 mg via INTRAVENOUS

## 2016-04-04 MED ORDER — SODIUM CHLORIDE 0.9 % IV SOLN: INTRAVENOUS | Status: DC

## 2016-04-04 MED ORDER — MIDAZOLAM HCL 2 MG/2ML IJ SOLN: 1.0000 mg | INTRAMUSCULAR | Status: DC | PRN

## 2016-04-04 MED ORDER — SODIUM CHLORIDE 0.9 % IV SOLN
4.0000 g | Freq: Once | INTRAVENOUS | Status: AC
  Administered 2016-04-05: 4 g via INTRAVENOUS
  Filled 2016-04-04: qty 40

## 2016-04-04 MED ORDER — CALCIUM GLUCONATE 10 % IV SOLN
2.0000 g | Freq: Once | INTRAVENOUS | Status: DC
  Filled 2016-04-04: qty 20

## 2016-04-04 MED ORDER — IOPAMIDOL (ISOVUE-370) INJECTION 76%
75.0000 mL | Freq: Once | INTRAVENOUS | Status: AC | PRN
  Administered 2016-04-04: 75 mL via INTRAVENOUS

## 2016-04-04 MED ORDER — MIDAZOLAM HCL 2 MG/2ML IJ SOLN
1.0000 mg | INTRAMUSCULAR | Status: DC | PRN
  Filled 2016-04-04: qty 2

## 2016-04-04 MED ORDER — SODIUM CHLORIDE 0.9 % IV SOLN
25.0000 ug/h | INTRAVENOUS | Status: DC
  Administered 2016-04-04: 50 ug/h via INTRAVENOUS
  Administered 2016-04-05: 100 ug/h via INTRAVENOUS
  Administered 2016-04-06 – 2016-04-07 (×2): 125 ug/h via INTRAVENOUS
  Administered 2016-04-08: 75 ug/h via INTRAVENOUS
  Administered 2016-04-10: 100 ug/h via INTRAVENOUS
  Filled 2016-04-04 (×6): qty 50

## 2016-04-04 MED ORDER — FENTANYL BOLUS VIA INFUSION
25.0000 ug | INTRAVENOUS | Status: DC | PRN
  Administered 2016-04-07 – 2016-04-10 (×2): 25 ug via INTRAVENOUS
  Filled 2016-04-04: qty 25

## 2016-04-04 MED ORDER — STROKE: EARLY STAGES OF RECOVERY BOOK
Freq: Once | Status: DC
  Filled 2016-04-04: qty 1

## 2016-04-04 NOTE — Progress Notes (Signed)
Called to pt room by neurologist Dr. Addison LankNadagam stating that patient was having a seizure and verbal orders received amd implemented. Pt blood pressure began to trend down with lowest reading of 87/46. Orders received to transfer to ICU. Report called and pt transferred with assistance of rapid response.

## 2016-04-04 NOTE — Progress Notes (Signed)
I reviewed her peripheral smear. I see schistocytes on low power field with absolute thrombocytopenia without signs of clumping. Coagulation studies are near normal limits aside from mildly elevated INR (not unexpected0 I reviewed other labs including high LDH and near normal fibrinogen, elevated total bilirubin Overall presentation is highly suspicious of TTP I recommend plasmapheresis daily until platelet count is >140K I discussed the risks & benefits with her daughter as the patient is intubated. She is afraid to make decision without consulting her brother but he is not reachable I see no alternatives to Rx.  Waiting for confirmation tests with ADAMTS13 level is unrealistic as it could take >7 days for results to be back. She ultimately agreed to proceed with plan of care The patient is receving platelet transfusion prior to central line placement Hemodialysis unit is informed and order set for plasmapheresis is signed  I will return tomorrow morning to check on her Please call if questions arise: [phone]  [facility] Surgery CenterGORSUCH, Zyaira Vejar, MD 04/19/2016

## 2016-04-04 NOTE — ED Notes (Signed)
Patient transported to CT 

## 2016-04-04 NOTE — Significant Event (Signed)
Rapid Response Event Note  Overview: Time Called: 1815 Arrival Time: 1817 Event Type: Neurologic  Initial Focused Assessment:  Called by RN, patient was hypotensive (+ CVA on CT at [place], pt tx to 3S), patient did seize prior to my arrival and received 1mg  ATIVAN IV, patient was being bolused fluids, patient needed new PIVs (attempted x 2 unsuccessful), per MD, patient tx to 69M for seizure treatment, patient will need to intubated/line per Neuro MD, CCM consulted, report given by RN, pt tx to 69M .                        Interventions:   Event Summary: Name of Physician Notified: Dr. Lavon PaganiniNandigam  at      at    Outcome: Transferred (Comment)     Laura Levy R

## 2016-04-04 NOTE — ED Notes (Signed)
Pt cleaned and changed, admitting MD at bedside

## 2016-04-04 NOTE — Procedures (Signed)
Central Venous Catheter Insertion Procedure Note Laura Levy [id number] 15-Apr-1941  Procedure: Insertion of Central Venous Catheter Indications: for plasmaphoresis  Procedure Details Consent: Risks of procedure as well as the alternatives and risks of each were explained to the (patient/caregiver).  Consent for procedure obtained. Time Out: Verified patient identification, verified procedure, site/side was marked, verified correct patient position, special equipment/implants available, medications/allergies/relevent history reviewed, required imaging and test results available.  Performed  Maximum sterile technique was used including antiseptics, cap, gloves, gown, hand hygiene, mask and sheet. Skin prep: Chlorhexidine; local anesthetic administered A triple lumen hemodialysis catheter was placed in the right internal jugular vein using the Seldinger technique.  Evaluation Blood flow good Complications: No apparent complications Patient did tolerate procedure well. Chest X-ray ordered to verify placement.  CXR: normal.  [name], MD, PhD 04/20/2016, 10:49 PM [facility] [phone] or if no answer [phone]

## 2016-04-04 NOTE — ED Notes (Signed)
Dr. Inocencio HomesGayle notified of platelet count 13.

## 2016-04-04 NOTE — Progress Notes (Signed)
Full consult to follow I have ordered labs Will return around 8 pm tonight to review smear and test results

## 2016-04-04 NOTE — ED Notes (Addendum)
Pt arrived via EMS from home for reports of vomiting once last night and once this morning. EMS reports pt has right side deficits and history of previous CVA. EMS reports 116/69, 110 HR, 97% RA, NSR. Pt alert on arrival. Pt with noted contracture to right arm. Pt speech slurred and not understandable.

## 2016-04-04 NOTE — Consult Note (Signed)
PULMONARY / CRITICAL CARE MEDICINE   Name: Laura Levy MRN: [id number] DOB: 08-08-1941    ADMISSION DATE:  2016/04/20 CONSULTATION DATE:  20-Apr-2016   REFERRING MD:  Dr Elvera Lennox  CHIEF COMPLAINT:  Altermed MS and thrombocytopenia   HISTORY OF PRESENT ILLNESS:   Hx is obtained from notes and family, pt unable to give hx  75 y.o. female with medical history significant of hypertension, prior strokes, dementia, presented to [facility] emergency room 6/2 with chief complaint of altered mental status, slurred speech beginning the evening prior. CT scan showed subacute versus acute stroke bilateral cerebellum. 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 She transferred to [facility], has experienced progressive decline in wakefulness and airway protection. She was then noted to have rhythmic movements consistent w seizure activity, better after ativan. She moved to ICU and CCM  consulted to assist with her care.    PAST MEDICAL HISTORY :  She  has a past medical history of Hypertension; Stroke (HCC); Dementia; Edema leg; and H. pylori infection.  PAST SURGICAL HISTORY: She  has past surgical history that includes Cataract extraction w/PHACO (Right, 03/27/2015); Cataract extraction w/PHACO (Left, 05/29/2015); and Rotator cuff repair.  No Known Allergies  No current facility-administered medications on file prior to encounter.   Current Outpatient Prescriptions on File Prior to Encounter  Medication Sig  . amLODipine (NORVASC) 10 MG tablet Take 10 mg by mouth daily.  Marland Kitchen amoxicillin (AMOXIL) 500 MG capsule Take 1 capsule by mouth 4 (four) times daily.  Marland Kitchen aspirin EC 81 MG tablet Take 81 mg by mouth daily.  Marland Kitchen atorvastatin (LIPITOR) 80 MG tablet Take 80 mg by mouth daily.  . Cholecalciferol (VITAMIN D3) 1000 units CHEW Chew 1,000 Units by mouth every morning.  . clarithromycin (BIAXIN) 500 MG tablet Take 1 tablet by mouth 2 (two)  times daily.  . clopidogrel (PLAVIX) 75 MG tablet Take 75 mg by mouth daily.  Marland Kitchen omeprazole (PRILOSEC) 20 MG capsule Take 1 capsule by mouth 2 (two) times daily.  . vitamin B-12 (CYANOCOBALAMIN) 1000 MCG tablet Take 1,000 mcg by mouth daily.    FAMILY HISTORY:  Her has no family status information on file.   SOCIAL HISTORY: She  reports that she has quit smoking. She does not have any smokeless tobacco history on file. She reports that she does not drink alcohol.  REVIEW OF SYSTEMS:   Patient unable to give  SUBJECTIVE:  Pt unable to speak or give hx  VITAL SIGNS: BP 101/60 mmHg  Pulse 103  Temp(Src) 98 F (36.7 C) (Axillary)  Resp 19  SpO2 100%  HEMODYNAMICS:    VENTILATOR SETTINGS:    INTAKE / OUTPUT:    PHYSICAL EXAMINATION: General:  Ill appearing elderly woman,  Neuro:  Some grimace with pain, will not react to voice, no cough, leftward gaze HEENT:  Pooling secretions noted in OP Cardiovascular:  Regular, no M Lungs:  Coarse BS B, no wheezes.  Abdomen:  Soft, NT, + BS Musculoskeletal:  No LE edema Skin:  Some tenting, no rash.   LABS:  BMET  Recent Labs Lab 04-20-16 0836  NA 139  K 3.7  CL 108  CO2 23  BUN 20  CREATININE 1.36*  GLUCOSE 120*    Electrolytes  Recent Labs Lab Apr 20, 2016 0836  CALCIUM 9.1    CBC  Recent Labs Lab 04/20/2016 0836  WBC 11.0  HGB 8.9*  HCT 25.8*  PLT 13*  Coag's No results for input(s): APTT, INR in the last 168 hours.  Sepsis Markers No results for input(s): LATICACIDVEN, PROCALCITON, O2SATVEN in the last 168 hours.  ABG No results for input(s): PHART, PCO2ART, PO2ART in the last 168 hours.  Liver Enzymes  Recent Labs Lab 2016-05-02 0836  AST 56*  ALT 20  ALKPHOS 53  BILITOT 3.0*  ALBUMIN 3.8    Cardiac Enzymes  Recent Labs Lab 2016/05/02 0836  TROPONINI 2.16*    Glucose No results for input(s): GLUCAP in the last 168 hours.  Imaging Ct Head Wo Contrast  May 02, 2016  CLINICAL DATA:   Vomiting beginning last night which is persistent. Weakness. History of old strokes. EXAM: CT HEAD WITHOUT CONTRAST TECHNIQUE: Contiguous axial images were obtained from the base of the skull through the vertex without intravenous contrast. COMPARISON:  01/01/2016.  02/16/2016. FINDINGS: New infarctions are seen within the inferior cerebellum, left more than right. There is mild swelling. No hemorrhage. Old cerebellar infarctions are present. Old pontine infarction. Old right occipital infarction. Old left frontoparietal infarction. Old right posterior frontal infarction. Chronic small-vessel disease within the deep white matter. No mass lesion, hydrocephalus or extra-axial collection. No calvarial abnormality. No fluid in the sinuses. IMPRESSION: Extensive old ischemic changes throughout the brain. New/subacute infarction at the inferior cerebellum left more than right. No mass effect or hemorrhage. Electronically Signed   By: Paulina Fusi M.D.   On: 05-02-2016 09:30   Ct Abdomen Pelvis W Contrast  05/02/2016  CLINICAL DATA:  Dementia and altered mental status. Vomiting beginning last night. EXAM: CT ABDOMEN AND PELVIS WITH CONTRAST TECHNIQUE: Multidetector CT imaging of the abdomen and pelvis was performed using the standard protocol following bolus administration of intravenous contrast. CONTRAST:  75 cc Isovue 370 COMPARISON:  01/01/2016 FINDINGS: The liver has a normal appearance. There are gallstones layering dependent within the gallbladder. No CT evidence of cholecystitis or obstruction. The spleen is normal. The pancreas is normal. The adrenal glands are normal. Small benign renal cysts bilaterally. No mass, stone or hydronephrosis. There is atherosclerosis of the aorta but no aneurysm. Bilateral renal artery stenosis is present. No retroperitoneal mass or adenopathy. No free intraperitoneal fluid or air. Uterus and adnexal regions appear unremarkable. No primary bowel pathology. Chronic degenerative  changes affect the spine, with 4 mm of anterolisthesis at L4-5 because of degenerative facet disease. IMPRESSION: No acute abdominal or pelvic finding. Atherosclerosis of the aorta and its branch vessels. Cholelithiasis without CT evidence of cholecystitis or obstruction. Electronically Signed   By: Paulina Fusi M.D.   On: May 02, 2016 11:03   Dg Chest Portable 1 View  2016-05-02  CLINICAL DATA:  Altered mental status and vomiting for 1 day, former smoker EXAM: PORTABLE CHEST 1 VIEW COMPARISON:  01/01/2016 FINDINGS: Limited inspiratory effect. Heart size upper normal. Vascular pattern normal. Mild diffuse interstitial prominence stable. IMPRESSION: Chronic interstitial disease.  No acute findings. Electronically Signed   By: Esperanza Heir M.D.   On: May 02, 2016 09:02   Ct Angio Chest Aorta W/cm &/or Wo/cm  05/02/2016  CLINICAL DATA:  Dementia and altered mental status. Vomiting beginning last night. EXAM: CT ANGIOGRAPHY CHEST WITH CONTRAST TECHNIQUE: Multidetector CT imaging of the chest was performed using the standard protocol during bolus administration of intravenous contrast. Multiplanar CT image reconstructions and MIPs were obtained to evaluate the vascular anatomy. CONTRAST:  75 cc Isovue 370 COMPARISON:  Radiography same day. FINDINGS: Arterial opacification is excellent. There is atherosclerosis of the aorta but no aneurysm or dissection.  Calcification of the brachiocephalic vessel origins but without evidence of flow-limiting stenosis. Pulmonary arteries appear normal without emboli. Coronary artery calcification is present. No pleural or pericardial fluid. There is pulmonary scarring with emphysema. No infiltrate or collapse. No mediastinal mass or lymphadenopathy. Scans in the upper abdomen show gallstones dependent within the gallbladder but no active process. Atherosclerosis of the upper abdominal aorta and its branch vessels without aneurysm. Bilateral renal artery stenosis. Review of the MIP  images confirms the above findings. IMPRESSION: No dissection. No pulmonary emboli. Atherosclerosis of the aorta and its branch vessels. Coronary artery calcifications. Bilateral renal artery stenoses. Emphysema and pulmonary scarring. No active chest pathology otherwise. Electronically Signed   By: Paulina FusiMark  Shogry M.D.   On: 01-26-2016 10:59     STUDIES:  Head Ct 6/2 >> extensive chronic ischemic changes, new acute / subacute L > R cerebellar infarcts, no mass effect or bleed  CT chest 6/2 >> no dissection, no PE, emphysematous changes CT abdomen/ pelvis 6/2 >> cholelithiasis without evidence obstruction, no other acute findings.   CULTURES: None  ANTIBIOTICS: None  SIGNIFICANT EVENTS: Intubated 6/2 for airway protection  LINES/TUBES: ETT 6/2 >>   DISCUSSION: 75 yo woman with HTN, mild dementia, hx CVA's admitted 6/2 with acute altered MS. Eval has revealed B cerebellar CVA's, mild acute renal failure, severe thrombocytopenia w schistocytes, NSTEMI. Suspect cerebellar and cardiac ischemia due to microvascular disease and pro-thrombotic state as opposed to macroembolism. Entire picture very concerning for TTP. She has poor IV access, has not had labs drawn successfully. Goal to transfuse plts prior to HD catheter placement if needed. May not be possible if labs (type and screen) can't be obtained.   ASSESSMENT / PLAN:  PULMONARY A: Acute respiratory failure due to inability to protect airway Presumed hx COPD given emphysema on Ct chest P:   ET intubation now for airway protection  Albuterol prn  CARDIOVASCULAR A:  Elevated troponin I, ? Due to microvascular thrombosis Hx hypertension P:  ECG Follow serial troponin Hold anti-HTN regimen Consider TTE if troponin continues to rise of if she develops hypotension  RENAL A:   Acute renal failure, note S Cr 1.06 in 02/2016 P:   Follow BMP, electrolytes Place foley to manage I/O's  GASTROINTESTINAL A:   Reported hx H  pylori SUP P:   pepcid   HEMATOLOGIC A:   Severe thrombocytopenia, etiology unclear but note episode of thrombocytopenia during her hosp in 3/'17 as well, consider drug induced (amoxicillin/clarithromycin), consider TTP, DIC Mild normocytic anemia P:  PT/INR, PTT are pending. If normal will need to strongly consider dx of TTP given schistocytes on smear. Will discuss with hematology - could either hold the antibiotics and follow versus pursue plasmaphoresis 6/2 pm. Suspect we will need to do the latter. .  Follow coags, smear, CBC, fibrinogen, LDH Will likely need plt's to get HD catheter if needed  INFECTIOUS A:   No clear source infxn at this time.  P:   Follow clinically, no abx for now Note possible rxn to amoxicillin or clarithromycin   ENDOCRINE A:   Hyperlipidemia    P:   Statin has been on hold for last few days (since her outpt abx were started)  NEUROLOGIC A:   Acute cerebellar CVA's, ? Possible contribution of thrombocytopenia and pro-thrombotic state Probable seizure activity noted 6/2 pm P:   RASS goal: -1 Ativan given x 1, keppra ordered 6/2 Sedation per PAD protocol  Hold plavix for now; no ASA or  heparin given severity of her thrombocytopenia EEG and MRI/A brain have been ordered Carotid US   FAMILY  - Updates: I reviewed clinical status, organ injuries and prognosis with pt's sister and brother at bedside 6/2. Explained that I am unable at this time to explain all the findings, but that her age and prior CVa's make her prognosis for meaningful survival poor. They want her supported fully until we can determine the severity of her Acute CVA's, the potential for reversibility of her thrombocytopenia and her organ injuries. She will need her airway protected, will intubate, support fully. Will need to revisit status and prognosis depending on her response to support and rx.   - Inter-disciplinary family meet or Palliative Care meeting due by:   04/11/16   Independent CC time 80 minutes   Levy Pupa, MD, PhD 04/28/2016, 8:32 PM [facility] [phone] or if no answer [phone]

## 2016-04-04 NOTE — ED Notes (Signed)
Called [facility], spoke to Dole FoodPhil  [phone]

## 2016-04-04 NOTE — Progress Notes (Signed)
Patient ID: Laura Levy, female   DOB: [birth year], 75 y.o.   MRN: [id number]  Patient seen and examined.  Full consult to follow.  Spoke with Neurology Dr Thad Rangereynolds and sghe rec transfer to tertiary care center for further management.  Spoke with Pathologist.  Severely low Platelets seen on smear with schistocytes.  Patient needs tertiary care center for further management.  Case discussed with Dr Inocencio HomesGayle ER physician to set up transfer.  Case discussed with Family Overall prognosis poor, full code.  Dr Alford Highlandichard Azarius Lambson.

## 2016-04-04 NOTE — Consult Note (Signed)
Requesting Physician: Dr.  Elvera Lennox    Reason for consultation:  To evaluate for acute stroke, altered mental status  HPI:                                                                                                                                         Laura Levy is an 75 y.o. female patient who  was transferred from [facility]. She lives with her sister who was available at bedside to provide history. Patient unable to provide history. Patient apparently started having vomiting yesterday evening, and became more drowsy and altered. She continued to have vomiting this morning and was more obtunded, she was taken to [facility] by her family. A CT of the head done in the ER showed evidence of strokes in bilateral frontoparietal areas as well as involving bilateral cerebellum. She was transferred to the [facility] for further neurological care.  Her family reports that patient had prior strokes although we're unable to provide clear history about the chronicity of the strokes are she had a residual symptoms. At baseline, patient appeared he had some right-sided weakness, ambulates using a cane,  is semi-independent with her ADLs although requires help with shower and needs supervision.     Past Medical History: Past Medical History  Diagnosis Date  . Hypertension   . Stroke (HCC)   . Dementia   . Edema leg   . H. pylori infection     Past Surgical History  Procedure Laterality Date  . Cataract extraction w/phaco Right 03/27/2015    Procedure: CATARACT EXTRACTION PHACO AND INTRAOCULAR LENS PLACEMENT (IOC);  Surgeon: Galen Manila, MD;  Location: [facility] ORS;  Service: Ophthalmology;  Laterality: Right;  [place] 01:00 AP%28.9 CDE 17.42  . Cataract extraction w/phaco Left 05/29/2015    Procedure: CATARACT EXTRACTION PHACO AND INTRAOCULAR LENS PLACEMENT (IOC);  Surgeon: Galen Manila, MD;  Location: [facility] ORS;  Service: Ophthalmology;  Laterality:  Left;  cassette lot# [id number]    [place] 00:27.7  AP 22.3 CDE  6.17  . Rotator cuff repair      Family History: Family History  Problem Relation Age of Onset  . Congestive Heart Failure Mother     Social History:   reports that she has quit smoking. She does not have any smokeless tobacco history on file. She reports that she does not drink alcohol. Her drug history is not on file.  Allergies:  No Known Allergies   Medications:  No current facility-administered medications for this encounter.   ROS:                                                                                                                                       History unobtainable from patient due to mental status  Neurologic Examination:                                                                                                    Blood pressure 90/70, heart rate 70, SPO2 99% on 2 L nasal cannula,   Patient is very drowsy, does not open eyes to verbal command or tactile stimulus. When passively opened her eyes, she had a left gaze deviation. Tongue twitching as well as left facial contraction on twitching was noticed initially. No motor response to stem was. No involuntary movements noted in upper or lower extremities other than the facial contraction and twitching as described.  After 1 mg Ativan was given, the timing and facial twitching has resolved although continued to have left gaze deviation. She is still not following commands.     Lab Results: Basic Metabolic Panel:  Recent Labs Lab 09/23/16 0836  NA 139  K 3.7  CL 108  CO2 23  GLUCOSE 120*  BUN 20  CREATININE 1.36*  CALCIUM 9.1    Liver Function Tests:  Recent Labs Lab 09/23/16 0836  AST 56*  ALT 20  ALKPHOS 53  BILITOT 3.0*  PROT 7.6  ALBUMIN 3.8    Recent Labs Lab 09/23/16 0836  LIPASE 26   No results  for input(s): AMMONIA in the last 168 hours.  CBC:  Recent Labs Lab 09/23/16 0836  WBC 11.0  NEUTROABS 7.9*  HGB 8.9*  HCT 25.8*  MCV 86.6  PLT 13*    Cardiac Enzymes:  Recent Labs Lab 09/23/16 0836  TROPONINI 2.16*    Lipid Panel: No results for input(s): CHOL, TRIG, HDL, CHOLHDL, VLDL, LDLCALC in the last 168 hours.  CBG: No results for input(s): GLUCAP in the last 168 hours.  Microbiology: Results for orders placed or performed during the hospital encounter of 01/01/16  Urine culture     Status: None   Collection Time: 01/01/16 10:51 AM  Result Value Ref Range Status   Specimen Description URINE, CLEAN CATCH  Final   Special Requests Normal  Final   Culture NO GROWTH 1 DAY  Final   Report Status 01/03/2016 FINAL  Final     Imaging: Ct Head Wo  Contrast  09-Apr-2016  CLINICAL DATA:  Vomiting beginning last night which is persistent. Weakness. History of old strokes. EXAM: CT HEAD WITHOUT CONTRAST TECHNIQUE: Contiguous axial images were obtained from the base of the skull through the vertex without intravenous contrast. COMPARISON:  01/01/2016.  02/16/2016. FINDINGS: New infarctions are seen within the inferior cerebellum, left more than right. There is mild swelling. No hemorrhage. Old cerebellar infarctions are present. Old pontine infarction. Old right occipital infarction. Old left frontoparietal infarction. Old right posterior frontal infarction. Chronic small-vessel disease within the deep white matter. No mass lesion, hydrocephalus or extra-axial collection. No calvarial abnormality. No fluid in the sinuses. IMPRESSION: Extensive old ischemic changes throughout the brain. New/subacute infarction at the inferior cerebellum left more than right. No mass effect or hemorrhage. Electronically Signed   By: Paulina Fusi M.D.   On: April 09, 2016 09:30   Ct Abdomen Pelvis W Contrast  09-Apr-2016  CLINICAL DATA:  Dementia and altered mental status. Vomiting beginning last night.  EXAM: CT ABDOMEN AND PELVIS WITH CONTRAST TECHNIQUE: Multidetector CT imaging of the abdomen and pelvis was performed using the standard protocol following bolus administration of intravenous contrast. CONTRAST:  75 cc Isovue 370 COMPARISON:  01/01/2016 FINDINGS: The liver has a normal appearance. There are gallstones layering dependent within the gallbladder. No CT evidence of cholecystitis or obstruction. The spleen is normal. The pancreas is normal. The adrenal glands are normal. Small benign renal cysts bilaterally. No mass, stone or hydronephrosis. There is atherosclerosis of the aorta but no aneurysm. Bilateral renal artery stenosis is present. No retroperitoneal mass or adenopathy. No free intraperitoneal fluid or air. Uterus and adnexal regions appear unremarkable. No primary bowel pathology. Chronic degenerative changes affect the spine, with 4 mm of anterolisthesis at L4-5 because of degenerative facet disease. IMPRESSION: No acute abdominal or pelvic finding. Atherosclerosis of the aorta and its branch vessels. Cholelithiasis without CT evidence of cholecystitis or obstruction. Electronically Signed   By: Paulina Fusi M.D.   On: 04-09-16 11:03   Dg Chest Portable 1 View  April 09, 2016  CLINICAL DATA:  Altered mental status and vomiting for 1 day, former smoker EXAM: PORTABLE CHEST 1 VIEW COMPARISON:  01/01/2016 FINDINGS: Limited inspiratory effect. Heart size upper normal. Vascular pattern normal. Mild diffuse interstitial prominence stable. IMPRESSION: Chronic interstitial disease.  No acute findings. Electronically Signed   By: Esperanza Heir M.D.   On: 04/09/16 09:02   Ct Angio Chest Aorta W/cm &/or Wo/cm  2016-04-09  CLINICAL DATA:  Dementia and altered mental status. Vomiting beginning last night. EXAM: CT ANGIOGRAPHY CHEST WITH CONTRAST TECHNIQUE: Multidetector CT imaging of the chest was performed using the standard protocol during bolus administration of intravenous contrast. Multiplanar CT  image reconstructions and MIPs were obtained to evaluate the vascular anatomy. CONTRAST:  75 cc Isovue 370 COMPARISON:  Radiography same day. FINDINGS: Arterial opacification is excellent. There is atherosclerosis of the aorta but no aneurysm or dissection. Calcification of the brachiocephalic vessel origins but without evidence of flow-limiting stenosis. Pulmonary arteries appear normal without emboli. Coronary artery calcification is present. No pleural or pericardial fluid. There is pulmonary scarring with emphysema. No infiltrate or collapse. No mediastinal mass or lymphadenopathy. Scans in the upper abdomen show gallstones dependent within the gallbladder but no active process. Atherosclerosis of the upper abdominal aorta and its branch vessels without aneurysm. Bilateral renal artery stenosis. Review of the MIP images confirms the above findings. IMPRESSION: No dissection. No pulmonary emboli. Atherosclerosis of the aorta and its branch vessels. Coronary  artery calcifications. Bilateral renal artery stenoses. Emphysema and pulmonary scarring. No active chest pathology otherwise. Electronically Signed   By: Paulina Fusi M.D.   On: 2016-04-30 10:59     Assessment and plan:   Laura Levy is an 75 y.o. female patient who  was transferred from [facility]. She lives with her sister who was available at bedside to provide history. Patient unable to provide history. Patient apparently started having vomiting yesterday evening, and became more drowsy and altered. She continued to have vomiting this morning and was more obtunded, she was taken to [facility] by her family. A CT of the head done in the ER showed evidence of strokes in bilateral frontoparietal areas as well as involving bilateral cerebellum. She was transferred to the [facility] for further neurological care.  Her family reports that patient had prior strokes although we're unable to provide clear  history about the chronicity of the strokes are she had a residual symptoms. At baseline, patient appeared he had some right-sided weakness, ambulates using a cane,  is semi-independent with her ADLs although requires help with shower and needs supervision.  On my initial exam, Patient is very drowsy, does not open eyes to verbal command or tactile stimulus. When passively opened her eyes, she had a left gaze deviation. Tongue twitching as well as left facial contraction on twitching was noticed initially. No motor response to stem was. No involuntary movements noted in upper or lower extremities other than the facial contraction and twitching as described. After 1 mg Ativan was given, the timing and facial twitching has resolved although continued to have left gaze deviation. She is still not following commands. Based on this, I suspect that she was in status epilepticus with left gaze deviation and left facial contraction, tongue and facial twitching noted. Clinically the convulsive activity in the face in time has improved with Ativan. Recommend stat IV Keppra 1 g twice a day. Her systolic blood pressure continues to be around 90 as per IV fluid boluses. Recommend transfer to ICU for further care, may require intubation if she dollars frustrated distress due to his medications and altered mental status from seizures. Her sister and brother were at bedside, and they were okay with intubation. Patient is full code at this time. She will also require central line and use of pressors as her blood pressure continues to be less than 100 despite IV fluid boluses. Discussed with primary hospitalist will be consulting critical care team for further care. After she is stabilized, we'll obtain a brain MRI for further neurological evaluation. EEG is ordered.  We'll follow-up.

## 2016-04-04 NOTE — ED Notes (Signed)
Dr. Inocencio HomesGayle notified of troponin 2.16

## 2016-04-04 NOTE — Progress Notes (Signed)
Pt admitted in from [facility] ED into room 3s02, pt lethargic responding to pain. Pupils assessment revealed both nystagmus and disconjugate primary MD made aware, no new orders at this time.  Will continue to monitor

## 2016-04-04 NOTE — ED Notes (Signed)
Pt family at bedside. Family reports pt neurological status different from baseline.

## 2016-04-04 NOTE — H&P (Addendum)
History and Physical    Laura Levy ZOX:[id number] DOB: 1940-12-25 DOA: Apr 13, 2016  PCP: Dorothey Baseman, MD  Patient coming from: [facility] emergency room  Chief Complaint: Altered mental status  HPI: Laura Levy is a 75 y.o. female with medical history significant of hypertension, prior strokes, dementia, presented to [facility] emergency room earlier today with chief complaint of altered mental status. Apparently she was normal up until the day prior, and had an episode of emesis after eating, and at that time family realized that her balance was off and she has slurred speech. She progressively became more altered and was brought to the emergency room. On my evaluation, patient is unable to talk to me, she seems to be trying to respond to commands however is nonverbal and does not follow many commands. The son and the daughter at bedside, and said she was normal up until last night when she became progressively altered.  ED Course: In [facility] emergency room, she had a CT scan which showed subacute versus acute stroke bilateral cerebellum. She was also found to have critically low platelets of 13. Hospitalist whether there was consulted for admission, he discussed with Dr. Emmaline Life from neurology and recommended transfer to tertiary care center, and thus patient was transferred to [facility]. Of note, the peripheral smear over there confirmed to severely low platelet counts and also showed schistocytes. She was also found to have an elevated troponin of 2.  Review of Systems: Unable to obtain review of system due to acute encephalopathy  Past Medical History  Diagnosis Date  . Hypertension   . Stroke (HCC)   . Dementia   . Edema leg   . H. pylori infection     Past Surgical History  Procedure Laterality Date  . Cataract extraction w/phaco Right 03/27/2015    Procedure: CATARACT EXTRACTION PHACO AND INTRAOCULAR LENS PLACEMENT (IOC);  Surgeon: Galen Manila, MD;  Location: [facility] ORS;   Service: Ophthalmology;  Laterality: Right;  [place] 01:00 AP%28.9 CDE 17.42  . Cataract extraction w/phaco Left 05/29/2015    Procedure: CATARACT EXTRACTION PHACO AND INTRAOCULAR LENS PLACEMENT (IOC);  Surgeon: Galen Manila, MD;  Location: [facility] ORS;  Service: Ophthalmology;  Laterality: Left;  cassette lot# [id number]    [place] 00:27.7  AP 22.3 CDE  6.17  . Rotator cuff repair       reports that she has quit smoking. She does not have any smokeless tobacco history on file. She reports that she does not drink alcohol. Her drug history is not on file.  No Known Allergies  Family History  Problem Relation Age of Onset  . Congestive Heart Failure Mother     Prior to Admission medications   Medication Sig Start Date End Date Taking? Authorizing Provider  amLODipine (NORVASC) 10 MG tablet Take 10 mg by mouth daily.   Yes Historical Provider, MD  amoxicillin (AMOXIL) 500 MG capsule Take 1 capsule by mouth 4 (four) times daily. 03/27/16 04/05/16 Yes Historical Provider, MD  aspirin EC 81 MG tablet Take 81 mg by mouth daily.   Yes Historical Provider, MD  atorvastatin (LIPITOR) 80 MG tablet Take 80 mg by mouth daily.   Yes Historical Provider, MD  Cholecalciferol (VITAMIN D3) 1000 units CHEW Chew 1,000 Units by mouth every morning. 02/19/16  Yes Katharina Caper, MD  clarithromycin (BIAXIN) 500 MG tablet Take 1 tablet by mouth 2 (two) times daily. 03/27/16 04/05/16 Yes Historical Provider, MD  clopidogrel (PLAVIX) 75 MG tablet Take 75 mg by mouth daily.  Yes Historical Provider, MD  omeprazole (PRILOSEC) 20 MG capsule Take 1 capsule by mouth 2 (two) times daily. 03/27/16  Yes Historical Provider, MD  vitamin B-12 (CYANOCOBALAMIN) 1000 MCG tablet Take 1,000 mcg by mouth daily.   Yes Historical Provider, MD    Physical Exam: Filed Vitals:   04/13/2016 1633  BP: 109/63  Pulse: 100  Temp: 98 F (36.7 C)  TempSrc: Axillary  Resp: 25  SpO2: 95%      Constitutional: Unresponsive, moans intermittently when  called by name Filed Vitals:   04/11/2016 1633  BP: 109/63  Pulse: 100  Temp: 98 F (36.7 C)  TempSrc: Axillary  Resp: 25  SpO2: 95%   Eyes: Pupils equally round and reactive ENMT: Mucous membranes are moist.  Respiratory: clear to auscultation bilaterally on anterior auscultation, no wheezing, no crackles.  Cardiovascular: Regular rate and rhythm, no murmurs / rubs / gallops. No extremity edema. .  Abdomen: no tenderness, no masses palpated. Bowel sounds positive.  Musculoskeletal: no clubbing / cyanosis. No joint deformity upper and lower extremities. No contractures. Normal muscle tone.  Skin: no rashes, lesions, ulcers. No induration Neurologic: Moves all 4 extremities spontaneously, does not follow commands Psychiatric: Poorly responsive  Labs on Admission: I have personally reviewed following labs and imaging studies  CBC:  Recent Labs Lab 05/02/2016 0836  WBC 11.0  NEUTROABS 7.9*  HGB 8.9*  HCT 25.8*  MCV 86.6  PLT 13*   Basic Metabolic Panel:  Recent Labs Lab 04/16/2016 0836  NA 139  K 3.7  CL 108  CO2 23  GLUCOSE 120*  BUN 20  CREATININE 1.36*  CALCIUM 9.1   GFR: Estimated Creatinine Clearance: 33.8 mL/min (by C-G formula based on Cr of 1.36). Liver Function Tests:  Recent Labs Lab 04/22/2016 0836  AST 56*  ALT 20  ALKPHOS 53  BILITOT 3.0*  PROT 7.6  ALBUMIN 3.8    Recent Labs Lab 04/22/2016 0836  LIPASE 26   Cardiac Enzymes:  Recent Labs Lab 04/12/2016 0836  TROPONINI 2.16*   Urine analysis:    Component Value Date/Time   COLORURINE YELLOW* 04/13/2016 0836   COLORURINE Yellow 12/01/2013 0522   APPEARANCEUR CLEAR* 04/05/2016 0836   APPEARANCEUR Clear 12/01/2013 0522   LABSPEC 1.009 04/24/2016 0836   LABSPEC 1.012 12/01/2013 0522   PHURINE 6.0 04/12/2016 0836   PHURINE 5.0 12/01/2013 0522   GLUCOSEU NEGATIVE 04/03/2016 0836   GLUCOSEU Negative 12/01/2013 0522   HGBUR 3+* 04/30/2016 0836   HGBUR Negative 12/01/2013 0522    BILIRUBINUR NEGATIVE 04/30/2016 0836   BILIRUBINUR Negative 12/01/2013 0522   KETONESUR NEGATIVE 04/25/2016 0836   KETONESUR Negative 12/01/2013 0522   PROTEINUR 30* 04/08/2016 0836   PROTEINUR Negative 12/01/2013 0522   NITRITE NEGATIVE 05/02/2016 0836   NITRITE Negative 12/01/2013 0522   LEUKOCYTESUR NEGATIVE 04/13/2016 0836   LEUKOCYTESUR Negative 12/01/2013 0522   Sepsis Labs: @LABRCNTIP (procalcitonin:4,lacticidven:4) )No results found for this or any previous visit (from the past 240 hour(s)).   Radiological Exams on Admission: Ct Head Wo Contrast  04/16/2016  CLINICAL DATA:  Vomiting beginning last night which is persistent. Weakness. History of old strokes. EXAM: CT HEAD WITHOUT CONTRAST TECHNIQUE: Contiguous axial images were obtained from the base of the skull through the vertex without intravenous contrast. COMPARISON:  01/01/2016.  02/16/2016. FINDINGS: New infarctions are seen within the inferior cerebellum, left more than right. There is mild swelling. No hemorrhage. Old cerebellar infarctions are present. Old pontine infarction. Old right occipital infarction. Old left  frontoparietal infarction. Old right posterior frontal infarction. Chronic small-vessel disease within the deep white matter. No mass lesion, hydrocephalus or extra-axial collection. No calvarial abnormality. No fluid in the sinuses. IMPRESSION: Extensive old ischemic changes throughout the brain. New/subacute infarction at the inferior cerebellum left more than right. No mass effect or hemorrhage. Electronically Signed   By: Paulina FusiMark  Shogry M.D.   On: 2015/11/12 09:30   Ct Abdomen Pelvis W Contrast  04/03/2016  CLINICAL DATA:  Dementia and altered mental status. Vomiting beginning last night. EXAM: CT ABDOMEN AND PELVIS WITH CONTRAST TECHNIQUE: Multidetector CT imaging of the abdomen and pelvis was performed using the standard protocol following bolus administration of intravenous contrast. CONTRAST:  75 cc Isovue 370  COMPARISON:  01/01/2016 FINDINGS: The liver has a normal appearance. There are gallstones layering dependent within the gallbladder. No CT evidence of cholecystitis or obstruction. The spleen is normal. The pancreas is normal. The adrenal glands are normal. Small benign renal cysts bilaterally. No mass, stone or hydronephrosis. There is atherosclerosis of the aorta but no aneurysm. Bilateral renal artery stenosis is present. No retroperitoneal mass or adenopathy. No free intraperitoneal fluid or air. Uterus and adnexal regions appear unremarkable. No primary bowel pathology. Chronic degenerative changes affect the spine, with 4 mm of anterolisthesis at L4-5 because of degenerative facet disease. IMPRESSION: No acute abdominal or pelvic finding. Atherosclerosis of the aorta and its branch vessels. Cholelithiasis without CT evidence of cholecystitis or obstruction. Electronically Signed   By: Paulina FusiMark  Shogry M.D.   On: 2015/11/12 11:03   Dg Chest Portable 1 View  05/01/2016  CLINICAL DATA:  Altered mental status and vomiting for 1 day, former smoker EXAM: PORTABLE CHEST 1 VIEW COMPARISON:  01/01/2016 FINDINGS: Limited inspiratory effect. Heart size upper normal. Vascular pattern normal. Mild diffuse interstitial prominence stable. IMPRESSION: Chronic interstitial disease.  No acute findings. Electronically Signed   By: Esperanza Heiraymond  Rubner M.D.   On: 2015/11/12 09:02   Ct Angio Chest Aorta W/cm &/or Wo/cm  04/06/2016  CLINICAL DATA:  Dementia and altered mental status. Vomiting beginning last night. EXAM: CT ANGIOGRAPHY CHEST WITH CONTRAST TECHNIQUE: Multidetector CT imaging of the chest was performed using the standard protocol during bolus administration of intravenous contrast. Multiplanar CT image reconstructions and MIPs were obtained to evaluate the vascular anatomy. CONTRAST:  75 cc Isovue 370 COMPARISON:  Radiography same day. FINDINGS: Arterial opacification is excellent. There is atherosclerosis of the aorta but  no aneurysm or dissection. Calcification of the brachiocephalic vessel origins but without evidence of flow-limiting stenosis. Pulmonary arteries appear normal without emboli. Coronary artery calcification is present. No pleural or pericardial fluid. There is pulmonary scarring with emphysema. No infiltrate or collapse. No mediastinal mass or lymphadenopathy. Scans in the upper abdomen show gallstones dependent within the gallbladder but no active process. Atherosclerosis of the upper abdominal aorta and its branch vessels without aneurysm. Bilateral renal artery stenosis. Review of the MIP images confirms the above findings. IMPRESSION: No dissection. No pulmonary emboli. Atherosclerosis of the aorta and its branch vessels. Coronary artery calcifications. Bilateral renal artery stenoses. Emphysema and pulmonary scarring. No active chest pathology otherwise. Electronically Signed   By: Paulina FusiMark  Shogry M.D.   On: 2015/11/12 10:59    EKG: Independently reviewed. Sinus rhythm, T-wave inversions in anterior leads, slightly more accentuated than prior EKGs  Assessment/Plan Active Problems:   Weakness   CKD (chronic kidney disease), stage III   Dementia   Essential hypertension   Hyperlipidemia   Thrombocytopenia (HCC)   CVA (  cerebral infarction)   Cerebellar stroke (HCC)   Acute encephalopathy - Likely in the setting of a new apparent cerebellar strokes  Acute CVA - Neurology consulted, discussed with Dr. Lavon Paganini - Hold off aspirin or antiplatelet agents given profound thrombocytopenia - We'll obtain MRI of the brain and the rest of the stroke workup  Thrombocytopenia - Platelets of 13. She was previously on aspirin and Plavix for her prior strokes. She has had thrombocytopenia in the past, most recently as of April of this year when her platelets were in the 40s. - ? TTP given elevated troponin and strokes with possible hypercoagulable state in the setting of destructive platelets vs DIC  ?cause. - Consulted oncology, discussed with Dr. Emeline Darling such, appreciate input - no apparent sepsis present  Chronic kidney disease, stage III - Her creatinine appears at baseline, she ranges anywhere between 1.1 and 1.2  Dementia - Unclear to what extent given acute encephalopathy  Elevated troponin - Possible in the setting of stroke, however is quite elevated at 2.1. No reported chest pain by family members. We'll continue to cycle overnight. I wonder if it's related to #3 rather than true ACS. Could not heparinize nor use antiplatelet agents right now.   Anemia - Her hemoglobin ranges between and 11 at baseline, is 8.9 currently. No clinical signs of bleeding - Continue to monitor. Peripheral smear is pending and will be reviewed by hematology later - Her bilirubin is elevated to 3, ? Hemolytic component. Haptoglobin is pending  Goals of care - Patient is critically ill, she has had new acute strokes as well as significant thrombocytopenia. This was discussed at length with family members at bedside, will await input from hematology and neurology however she appears to have a poor prognostic right now. They expressed understanding and wished for the patient to remain full code.   Addendum 7 pm. Further discussed with Dr. 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 Talked to Dr. Craige Cotta from ICU for consult, transfer initiated. Likely patient needs intubation and central line. Updated Dr. Bertis Ruddy.    DVT prophylaxis: SCD  Code Status: Full code (confirmed at bedside with patient's son and daughter)  Family Communication: Discussed with Zollie Beckers (son) and French Ana (daughter) Disposition Plan: TBD Consults called:  Neurology, hematology  Admission status: Inpatient    Pamella Pert, MD [facility] Pager [phone]  If 7PM-7AM, please contact night-coverage [email or web address] Password [email or web address]  05/01/2016,  5:22 PM

## 2016-04-04 NOTE — ED Notes (Signed)
Pt SpO2 decreased to 89% RA. Pt placed on 2L oxygen via nasal cannula. Dr. Inocencio HomesGayle notified.

## 2016-04-04 NOTE — Consult Note (Addendum)
[employer] [facility] - [place] [facility]   PATIENT NAME: Laura Levy    MR#:  [id number]  DATE OF BIRTH:  October 18, 1941  DATE OF ADMISSION:  April 13, 2016  PRIMARY CARE PHYSICIAN: Dorothey Baseman, MD   REQUESTING/REFERRING PHYSICIAN: Raymon Mutton  CHIEF COMPLAINT:   Chief Complaint  Patient presents with  . Nausea  . Emesis    HISTORY OF PRESENT ILLNESS:  Laura Levy  is a 75 y.o. female. She lives at home with family. Yesterday she ate a hotdog and vomited. They tried to get her up on her feet in the bathroom but she couldn't balance correctly. They can hardly understand what she was saying. She can hardly move her legs. She wouldn't open her eyes. She was like that all last night. This morning the nurses aide came in and she vomited again and had a bowel movement. And she advised coming into the hospital. Patient unable to give any history at this time. Son and daughter at the bedside.  As per the family, she was recently started on H. pylori treatment with clarithromycin, amoxicillin and omeprazole a few days back. She also recently had a stroke.  In the ER, a CT scan of the head showed new subacute versus acute stroke bilateral cerebellum. Also her platelet count was severely low. Hospitalist services were contacted for further evaluation.  PAST MEDICAL HISTORY:   Past Medical History  Diagnosis Date  . Hypertension   . Stroke (HCC)   . Dementia   . Edema leg   . H. pylori infection     PAST SURGICAL HISTOIRY:   Past Surgical History  Procedure Laterality Date  . Cataract extraction w/phaco Right 03/27/2015    Procedure: CATARACT EXTRACTION PHACO AND INTRAOCULAR LENS PLACEMENT (IOC);  Surgeon: Galen Manila, MD;  Location: [facility] ORS;  Service: Ophthalmology;  Laterality: Right;  [place] 01:00 AP%28.9 CDE 17.42  . Cataract extraction w/phaco Left 05/29/2015    Procedure: CATARACT EXTRACTION PHACO AND INTRAOCULAR LENS PLACEMENT (IOC);  Surgeon: Galen Manila, MD;   Location: [facility] ORS;  Service: Ophthalmology;  Laterality: Left;  cassette lot# [id number]    [place] 00:27.7  AP 22.3 CDE  6.17  . Rotator cuff repair      SOCIAL HISTORY:   Social History  Substance Use Topics  . Smoking status: Former Games developer  . Smokeless tobacco: Not on file  . Alcohol Use: No    FAMILY HISTORY:   Family History  Problem Relation Age of Onset  . Congestive Heart Failure Mother     DRUG ALLERGIES:  No Known Allergies  REVIEW OF SYSTEMS:  Unable to provide review of systems secondary to altered mental status secondary to stroke.  MEDICATIONS AT HOME:   Prior to Admission medications   Medication Sig Start Date End Date Taking? Authorizing Provider  amLODipine (NORVASC) 10 MG tablet Take 10 mg by mouth daily.   Yes Historical Provider, MD  amoxicillin (AMOXIL) 500 MG capsule Take 1 capsule by mouth 4 (four) times daily. 03/27/16 04/05/16 Yes Historical Provider, MD  aspirin EC 81 MG tablet Take 81 mg by mouth daily.   Yes Historical Provider, MD  atorvastatin (LIPITOR) 80 MG tablet Take 80 mg by mouth daily.   Yes Historical Provider, MD  Cholecalciferol (VITAMIN D3) 1000 units CHEW Chew 1,000 Units by mouth every morning. 02/19/16  Yes Katharina Caper, MD  clarithromycin (BIAXIN) 500 MG tablet Take 1 tablet by mouth 2 (two) times daily. 03/27/16 04/05/16 Yes Historical Provider, MD  clopidogrel (  PLAVIX) 75 MG tablet Take 75 mg by mouth daily.   Yes Historical Provider, MD  omeprazole (PRILOSEC) 20 MG capsule Take 1 capsule by mouth 2 (two) times daily. 03/27/16  Yes Historical Provider, MD  vitamin B-12 (CYANOCOBALAMIN) 1000 MCG tablet Take 1,000 mcg by mouth daily.   Yes Historical Provider, MD      VITAL SIGNS:  Blood pressure 103/53, pulse 107, temperature 98.1 F (36.7 C), temperature source Axillary, resp. rate 15, height 5\' 3"  (1.6 m), weight 64.411 kg (142 lb), SpO2 97 %.  PHYSICAL EXAMINATION:  GENERAL:  75 y.o.-year-old patient lying in the bed with no acute  distress.  EYES: Left pupil larger than right and both pupils are sluggish. No scleral icterus.  HEENT: Head atraumatic, normocephalic. Oropharynx and nasopharynx clear.  NECK:  Supple, no jugular venous distention. No thyroid enlargement, no tenderness.  LUNGS: Normal breath sounds bilaterally, no wheezing, rales,rhonchi or crepitation. No use of accessory muscles of respiration.  CARDIOVASCULAR: S1, S2 tachycardic. No murmurs, rubs, or gallops.  ABDOMEN: Soft, nontender, nondistended. Bowel sounds present. No organomegaly or mass.  EXTREMITIES: No pedal edema, cyanosis, or clubbing.  NEUROLOGIC: Patient mumbles when stimulated. Right arm contracted. Babinski upgoing bilaterally  PSYCHIATRIC: The patient is lethargic.  SKIN: No obvious rash, lesion, or ulcer. Corns seen on second toe bilaterally  LABORATORY PANEL:   CBC  Recent Labs Lab 2016/04/20 0836  WBC 11.0  HGB 8.9*  HCT 25.8*  PLT 13*   ------------------------------------------------------------------------------------------------------------------  Chemistries   Recent Labs Lab 04-20-2016 0836  NA 139  K 3.7  CL 108  CO2 23  GLUCOSE 120*  BUN 20  CREATININE 1.36*  CALCIUM 9.1  AST 56*  ALT 20  ALKPHOS 53  BILITOT 3.0*   ------------------------------------------------------------------------------------------------------------------  Cardiac Enzymes  Recent Labs Lab 2016-04-20 0836  TROPONINI 2.16*   ------------------------------------------------------------------------------------------------------------------  RADIOLOGY:  Ct Head Wo Contrast  04-20-16  CLINICAL DATA:  Vomiting beginning last night which is persistent. Weakness. History of old strokes. EXAM: CT HEAD WITHOUT CONTRAST TECHNIQUE: Contiguous axial images were obtained from the base of the skull through the vertex without intravenous contrast. COMPARISON:  01/01/2016.  02/16/2016. FINDINGS: New infarctions are seen within the inferior  cerebellum, left more than right. There is mild swelling. No hemorrhage. Old cerebellar infarctions are present. Old pontine infarction. Old right occipital infarction. Old left frontoparietal infarction. Old right posterior frontal infarction. Chronic small-vessel disease within the deep white matter. No mass lesion, hydrocephalus or extra-axial collection. No calvarial abnormality. No fluid in the sinuses. IMPRESSION: Extensive old ischemic changes throughout the brain. New/subacute infarction at the inferior cerebellum left more than right. No mass effect or hemorrhage. Electronically Signed   By: Paulina Fusi M.D.   On: 04/20/16 09:30   Ct Abdomen Pelvis W Contrast  04/20/16  CLINICAL DATA:  Dementia and altered mental status. Vomiting beginning last night. EXAM: CT ABDOMEN AND PELVIS WITH CONTRAST TECHNIQUE: Multidetector CT imaging of the abdomen and pelvis was performed using the standard protocol following bolus administration of intravenous contrast. CONTRAST:  75 cc Isovue 370 COMPARISON:  01/01/2016 FINDINGS: The liver has a normal appearance. There are gallstones layering dependent within the gallbladder. No CT evidence of cholecystitis or obstruction. The spleen is normal. The pancreas is normal. The adrenal glands are normal. Small benign renal cysts bilaterally. No mass, stone or hydronephrosis. There is atherosclerosis of the aorta but no aneurysm. Bilateral renal artery stenosis is present. No retroperitoneal mass or adenopathy. No free intraperitoneal fluid  or air. Uterus and adnexal regions appear unremarkable. No primary bowel pathology. Chronic degenerative changes affect the spine, with 4 mm of anterolisthesis at L4-5 because of degenerative facet disease. IMPRESSION: No acute abdominal or pelvic finding. Atherosclerosis of the aorta and its branch vessels. Cholelithiasis without CT evidence of cholecystitis or obstruction. Electronically Signed   By: Paulina Fusi M.D.   On: 04/24/2016  11:03   Dg Chest Portable 1 View  04/16/2016  CLINICAL DATA:  Altered mental status and vomiting for 1 day, former smoker EXAM: PORTABLE CHEST 1 VIEW COMPARISON:  01/01/2016 FINDINGS: Limited inspiratory effect. Heart size upper normal. Vascular pattern normal. Mild diffuse interstitial prominence stable. IMPRESSION: Chronic interstitial disease.  No acute findings. Electronically Signed   By: Esperanza Heir M.D.   On: 05/02/2016 09:02   Ct Angio Chest Aorta W/cm &/or Wo/cm  04/26/2016  CLINICAL DATA:  Dementia and altered mental status. Vomiting beginning last night. EXAM: CT ANGIOGRAPHY CHEST WITH CONTRAST TECHNIQUE: Multidetector CT imaging of the chest was performed using the standard protocol during bolus administration of intravenous contrast. Multiplanar CT image reconstructions and MIPs were obtained to evaluate the vascular anatomy. CONTRAST:  75 cc Isovue 370 COMPARISON:  Radiography same day. FINDINGS: Arterial opacification is excellent. There is atherosclerosis of the aorta but no aneurysm or dissection. Calcification of the brachiocephalic vessel origins but without evidence of flow-limiting stenosis. Pulmonary arteries appear normal without emboli. Coronary artery calcification is present. No pleural or pericardial fluid. There is pulmonary scarring with emphysema. No infiltrate or collapse. No mediastinal mass or lymphadenopathy. Scans in the upper abdomen show gallstones dependent within the gallbladder but no active process. Atherosclerosis of the upper abdominal aorta and its branch vessels without aneurysm. Bilateral renal artery stenosis. Review of the MIP images confirms the above findings. IMPRESSION: No dissection. No pulmonary emboli. Atherosclerosis of the aorta and its branch vessels. Coronary artery calcifications. Bilateral renal artery stenoses. Emphysema and pulmonary scarring. No active chest pathology otherwise. Electronically Signed   By: Paulina Fusi M.D.   On: 04/20/2016  10:59    EKG:   Normal sinus rhythm 89 bpm flipped T waves V1 through 3  IMPRESSION AND PLAN:   1. Acute stroke versus subacute stroke with altered mental status. On CT scan bilateral cerebellar strokes. Case discussed with Dr. Thad Ranger neurology and she recommended a tertiary care center. We are limited in treatment because of her mental status and her platelet count being severely low. Hold on antiplatelet agents until sure that this is not a hemorrhagic stroke. Overall prognosis is poor. Patient currently a full code. Patient will likely need an MRI to see if this is a hemorrhagic stroke. 2. Severe thrombocytopenia. I spoke with the lab to have the pathologist review the smear. I spoke with Dr. Zackery Barefoot pathology and she confirmed schistocytes throughout the peripheral smear and severely low platelet counts. This also needs a tertiary care center to evaluate on whether she needs plasmapheresis or not. Family was okay with platelet transfusion if needed. 3. Elevated troponin. This can happen with a severe stroke. Serial troponins will be needed. 4. Relative hypotension can continue IV fluids and hold antihypertensive medication 5. Acute kidney injury can continue IV fluids 6. Anemia. Likely secondary to the fragmented red blood cells on the schistocytes seen. Continue to monitor.  Case discussed with ER physician and family about transfer to a tertiary care center. Dr. Inocencio Homes was able to arrange a transfer for the patient.  All the records are  reviewed and case discussed with Consulting provider. Management plans discussed with the patient, family and they are in agreement.  CODE STATUS: Full code  TOTAL TIME TAKING CARE OF THIS PATIENT: 50 minutes in coordination of care of this critically ill patient. Case discussed with family ER physician and neurology and pathologist.   Duwaine MaxinWIETING, Lan Mcneill M.D on 04/22/2016 at 1:54 PM  Between 7am to 6pm - Pager - [phone]  After 6pm go to  [email or web address] - password [email or web address]  [facility]  Office  [phone]  CC: Primary care Physician: Dorothey BasemanAVID BRONSTEIN, MD

## 2016-04-04 NOTE — Progress Notes (Signed)
[age] F with declining mental status per family for the past week. Eval in [place] ER revealed bilateral cerebellar strokes, subacute without mass effect or hemorrhage.   ER MD spoke with Dr. Lavon PaganiniNandigam neurology who recommends MRI brain/neck though [facility] not [facility] delayed for this.   Pt to [facility].  Need to verify MRI brain/neck when she arrives, needs to be ordered here if not done at [facility].  ER MD is also going to notify Neurosurgery and ask them to see the patient when she arrives here.

## 2016-04-04 NOTE — ED Notes (Signed)
Pt vomited green liquid, scant amount of bloody mucus in vomitus noted. Dr. Inocencio HomesGayle notified.

## 2016-04-04 NOTE — ED Notes (Signed)
This Clinical research associatewriter attempted to [facility]. Receiving RN unable to take report at this time.

## 2016-04-04 NOTE — ED Provider Notes (Addendum)
[facility] Emergency Department Provider Note   ____________________________________________  Time seen: Approximately 8:22 AM  I have reviewed the triage vital signs and the nursing notes.   HISTORY  Chief Complaint Nausea and Emesis  Caveat-history of present illness review of systems is limited due to the patient's altered mental status. All information obtained from EMS on arrival.  HPI Laura Levy is a 75 y.o. 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 On EMS arrival, she was awake with mild tachycardia, normal blood pressure. No other history is obtained at this time.    Past Medical History  Diagnosis Date  . Hypertension   . Stroke (HCC)   . Dementia   . Edema leg   . H. pylori infection     Patient Active Problem List   Diagnosis Date Noted  . Acute cerebrovascular accident (CVA) of cerebellum (HCC) 02/19/2016  . Dizziness and giddiness 02/19/2016  . CKD (chronic kidney disease), stage III 02/19/2016  . Dementia 02/19/2016  . Essential hypertension 02/19/2016  . Hyperlipidemia 02/19/2016  . Vitamin D deficiency 02/19/2016  . Thrombocytopenia (HCC) 02/19/2016  . UTI (lower urinary tract infection) 01/01/2016  . Hypokalemia 01/01/2016  . Weakness 01/01/2016    Past Surgical History  Procedure Laterality Date  . Cataract extraction w/phaco Right 03/27/2015    Procedure: CATARACT EXTRACTION PHACO AND INTRAOCULAR LENS PLACEMENT (IOC);  Surgeon: Galen ManilaWilliam Porfilio, MD;  Location: [facility] ORS;  Service: Ophthalmology;  Laterality: Right;  US 01:00 AP%28.9 CDE 17.42  . Cataract extraction w/phaco Left 05/29/2015    Procedure: CATARACT EXTRACTION PHACO AND INTRAOCULAR LENS PLACEMENT (IOC);  Surgeon: Galen ManilaWilliam Porfilio, MD;  Location: [facility] ORS;  Service: Ophthalmology;   Laterality: Left;  cassette lot# [id number]    US 00:27.7  AP 22.3 CDE  6.17  . Rotator cuff repair      Current Outpatient Rx  Name  Route  Sig  Dispense  Refill  . amLODipine (NORVASC) 10 MG tablet   Oral   Take 10 mg by mouth daily.         Marland Kitchen. amoxicillin (AMOXIL) 500 MG capsule   Oral   Take 1 capsule by mouth 4 (four) times daily.         Marland Kitchen. aspirin EC 81 MG tablet   Oral   Take 81 mg by mouth daily.         Marland Kitchen. atorvastatin (LIPITOR) 80 MG tablet   Oral   Take 80 mg by mouth daily.         . Cholecalciferol (VITAMIN D3) 1000 units CHEW   Oral   Chew 1,000 Units by mouth every morning.   30 tablet   6   . clarithromycin (BIAXIN) 500 MG tablet   Oral   Take 1 tablet by mouth 2 (two) times daily.         . clopidogrel (PLAVIX) 75 MG tablet   Oral   Take 75 mg by mouth daily.         Marland Kitchen. omeprazole (PRILOSEC) 20 MG capsule   Oral   Take 1 capsule by mouth 2 (two) times daily.         . vitamin B-12 (CYANOCOBALAMIN) 1000 MCG tablet   Oral   Take 1,000 mcg by mouth daily.           Allergies Review of  patient's allergies indicates no known allergies.  Family History  Problem Relation Age of Onset  . Congestive Heart Failure Mother     Social History Social History  Substance Use Topics  . Smoking status: Former Games developer  . Smokeless tobacco: None  . Alcohol Use: No    Review of Systems  Gastrointestinal: + vomiting.    Caveat-history of present illness review of systems is limited due to the patient's altered mental status. All information obtained from EMS on arrival. ____________________________________________   PHYSICAL EXAM: Filed Vitals:   04/15/2016 1130 April 15, 2016 1200 Apr 15, 2016 1400 April 15, 2016 1455  BP: 102/70 103/53 114/71 109/65  Pulse:  107  92  Temp:    97.7 F (36.5 C)  TempSrc:      Resp: 15 15 11 16   Height:      Weight:      SpO2:  97%  94%     Constitutional: Awake but poorly communicative, follows commands. Speech  is difficult to understand. Cooperates partially with the exam. Eyes: Conjunctivae are normal. PERRL. EOMI. Head: Atraumatic. Nose: No congestion/rhinnorhea. Mouth/Throat: Mucous membranes are moist.  Oropharynx non-erythematous. Neck: No stridor. Supple without meningismus. Cardiovascular: Tachycardic rate, regular rhythm. Grossly normal heart sounds.  Good peripheral circulation. Respiratory: Normal respiratory effort.  No retractions. Lungs CTAB. Gastrointestinal: Soft and nontender. No distention. No CVA tenderness. Genitourinary: Deferred Musculoskeletal: No lower extremity tenderness nor edema.  No joint effusions. Neurologic: Speech is difficult to understand, patient is repeating the same phrases stating "ok ok". She has chronic contractures in the right arm with weakness. She moves the left arm, left and right leg equally to command but is otherwise unable to cooperate with formal neurological testing. Skin:  Skin is warm, dry and intact. No rash noted. Psychiatric: Unable to assess.  ____________________________________________   LABS (all labs ordered are listed, but only abnormal results are displayed)  Labs Reviewed  CBC WITH DIFFERENTIAL/PLATELET - Abnormal; Notable for the following:    RBC 2.98 (*)    Hemoglobin 8.9 (*)    HCT 25.8 (*)    RDW 15.6 (*)    Platelets 13 (*)    Neutro Abs 7.9 (*)    Monocytes Absolute 1.1 (*)    All other components within normal limits  COMPREHENSIVE METABOLIC PANEL - Abnormal; Notable for the following:    Glucose, Bld 120 (*)    Creatinine, Ser 1.36 (*)    AST 56 (*)    Total Bilirubin 3.0 (*)    GFR calc non Af Amer 38 (*)    GFR calc Af Amer 44 (*)    All other components within normal limits  TROPONIN I - Abnormal; Notable for the following:    Troponin I 2.16 (*)    All other components within normal limits  URINALYSIS COMPLETEWITH MICROSCOPIC ([facility] ONLY) - Abnormal; Notable for the following:    Color, Urine YELLOW (*)     APPearance CLEAR (*)    Hgb urine dipstick 3+ (*)    Protein, ur 30 (*)    Squamous Epithelial / LPF 0-5 (*)    All other components within normal limits  LIPASE, BLOOD  PATHOLOGIST SMEAR REVIEW   ____________________________________________  EKG  ED ECG REPORT I, Gayla Doss, the attending physician, personally viewed and interpreted this ECG.   Date: April 15, 2016  EKG Time: 08:23  Rate: 99  Rhythm: normal sinus rhythm  Axis: normal  Intervals:none  ST&T Change: No acute ST elevation. T-wave inversions in the anteroseptal leads.  Q waves in 3 and aVF. Artifact somewhat limits the interpretation.  ED ECG REPORT I, Gayla Doss, the attending physician, personally viewed and interpreted this ECG.   Date: 05/01/2016  EKG Time: 09:56  Rate: 89  Rhythm: normal sinus rhythm  Axis: normal  Intervals:none  ST&T Change: No acute ST elevation. T-wave inversions in the anteroseptal leads with minimal ST depression noted. Q waves in 3 and aVF.   ____________________________________________  RADIOLOGY  CT head  IMPRESSION: Extensive old ischemic changes throughout the brain. New/subacute infarction at the inferior cerebellum left more than right. No mass effect or hemorrhage.  CXR IMPRESSION: Chronic interstitial disease. No acute findings.  CTA chest IMPRESSION: No dissection. No pulmonary emboli. Atherosclerosis of the aorta and its branch vessels. Coronary artery calcifications. Bilateral renal artery stenoses.  Emphysema and pulmonary scarring. No active chest pathology Otherwise.  CT abdomen and pelvis IMPRESSION: No acute abdominal or pelvic finding. Atherosclerosis of the aorta and its branch vessels.  Cholelithiasis without CT evidence of cholecystitis or obstruction.  ____________________________________________   PROCEDURES  Procedure(s) performed: None  Critical Care performed:  No  ____________________________________________   INITIAL IMPRESSION / ASSESSMENT AND PLAN / ED COURSE  Pertinent labs & imaging results that were available during my care of the patient were reviewed by me and considered in my medical decision making (see chart for details).  Caveat-history of present illness review of systems is limited due to the patient's altered mental status. All information obtained from EMS on arrival. On exam, she is poorly communicative but does follow commands to move all extremities. Her speech is very difficult to understand. She is mildly tachycardic but the remainder of her vital signs are stable and she is afebrile. She has no abdominal tenderness. Differential for her nausea vomiting is broad, additionally on her most recent discharge summary from April 2017, she was speaking fluently. We'll obtain screening labs, CT head, chest x-ray, urinalysis, likely obtain advanced imaging of the abdomen and pelvis given her age and her nausea and vomiting. We'll give IV fluids, antiemetics, reassess for disposition.  ----------------------------------------- 11:24 AM on 04/12/2016 ----------------------------------------- The patient's family is at bedside and they are able to give additional history. They report that her speech has been like this for over a week but her mental status has been declining since yesterday night.  Her chest x-ray is clear. CT scan of her head shows  new cerebellar infarct which I suspect is the most likely cause of her nausea and vomiting. I did obtain a CT scan of her chest abdomen pelvis, there is no dissection, no acute intra-abdominal pathology. Troponin was elevated at 2.16, Dr. Gwen Pounds reviewed her EKG, agrees no STEMI, symmetric T-wave inversions in the precordium could be secondary to the acute intracranial process. CBC shows anemia, hemoglobin 8.9, additionally her platelets are only 13,000, will not give ASA. Her leukocytes are not  affected. CMP with mild creatinine elevation of 1.36, IV fluids given. Urinalysis is not consistent with infection.Case discussed with the hospitalist, Dr. for Charlann Lange for admission at this time.  ----------------------------------------- 1:18 PM on 04/16/2016 -----------------------------------------  The hospitalist has reviewed the case with on-call neurologist, Dr. Thad Ranger, the recommendation is for transfer to [facility] where possible intervention could be performed and the patient could have neurosurgical coverage if she were to decompensate, swell/herniate, suffer complication of this cerebellar infarct. I discussed the case with Dr. Lavon Paganini, [facility] neurologist who is aware of the patient. I discussed the case with Dr.Ikramullah, Hospitalist and  he will accept the patient. I reassessed the patient, her GCS is still 10, she is protecting her airway.  ----------------------------------------- 3:46 PM on 04-15-16 ----------------------------------------- [facility] has arrived to  take the patient to [facility]. She briefly open her eyes and made eye contact with me when I called her name, the remainder of her exam is unchanged.   ____________________________________________   FINAL CLINICAL IMPRESSION(S) / ED DIAGNOSES  Final diagnoses:  Non-intractable vomiting with nausea, vomiting of unspecified type  Cerebellar stroke (HCC)      NEW MEDICATIONS STARTED DURING THIS VISIT:  New Prescriptions   No medications on file     Note:  This document was prepared using Dragon voice recognition software and may include unintentional dictation errors.    Gayla Doss, MD Apr 15, 2016 1139  Gayla Doss, MD 04/15/16 [phone]

## 2016-04-04 NOTE — Progress Notes (Signed)
[place] NOTE  Patient Care Team: Juluis Pitch, MD as PCP - General (Family Medicine)  CHIEF COMPLAINTS/PURPOSE OF CONSULTATION:  Acute on chronic thrombocytopenia, altered mental status, for further evaluation  HISTORY OF PRESENTING ILLNESS:  Laura Levy 75 y.o. female is here because of thrombocytopenia. I was not able to obtain any history from the patient due to altered mental status. Her son and daughter provides some history. I reviewed the electronic records extensively. The patient has significant history of smoking and advanced dementia with recurrent stroke. She has evidence of stroke even dated back to 2009, based on MRI scan. She has recurrent admissions to the hospital for various reasons and is currently residing in a skilled nursing facility. Her latest imaging study with MRI dated 02/16/2016 showed subcentimeter acute, nonhemorrhagic right inferior cerebellar infarct and extensive atrophy and small vessel disease with numerous areas of significant chronic signs of cerebral and cerebellar infarction. Interestingly, starting around February 2017, she was noted to have acute thrombocytopenia. At that time, she had weakness and evidence of urinary tract infection. By the time she was discharged from the hospital, her platelet count has improved to 62,000. Recently, she was readmitted to the hospital again around 02/16/2016 with profound weakness and underwent imaging study. It was noted that her platelet count was borderline, slightly greater than 100,000. She was never evaluated by a hematologist. According to her daughter, the patient started to look unwell yesterday with new onset of nausea and weakness. She was brought to [facility] and was noted to have significant thrombocytopenia with platelet count of 13,000. CT scan with contrast showed persistent ischemic changes. Complete metabolic panel show mild acute renal failure,  mildly elevated AST and elevated total bilirubin. CT scan of the chest, abdomen and pelvis dated 05/01/2016 did not reveal other abnormalities. Specifically, there were no evidence of liver disease. Her baseline bilirubin in 2015 were within normal limits but started to become elevated in February 2017, as high as 1.8. Today, her total bilirubin was elevated at 3.0. The patient was subsequently transfer to [facility] and she arrived on the floor at 5 PM. I evaluated her both at 5 PM and 8 PM. Between 5 pm to 6 PM, she was evaluated by neurologist and was noted to have evidence of seizures. She was subsequently moved to medical ICU for further care. There were no reported recent bruising/bleeding, such as spontaneous epistaxis, hematuria, melena or hematochezia There was no reported history of liver disease, history of cardiac murmur/prior cardiovascular surgery or recent new medications  MEDICAL HISTORY:  Past Medical History  Diagnosis Date  . Hypertension   . Stroke ([place])   . Dementia   . Edema leg   . H. pylori infection     SURGICAL HISTORY: Past Surgical History  Procedure Laterality Date  . Cataract extraction w/phaco Right 03/27/2015    Procedure: CATARACT EXTRACTION PHACO AND INTRAOCULAR LENS PLACEMENT (IOC);  Surgeon: Birder Robson, MD;  Location: [facility] ORS;  Service: Ophthalmology;  Laterality: Right;  [place] 01:00 AP%28.9 CDE 17.42  . Cataract extraction w/phaco Left 05/29/2015    Procedure: CATARACT EXTRACTION PHACO AND INTRAOCULAR LENS PLACEMENT (IOC);  Surgeon: Birder Robson, MD;  Location: [facility] ORS;  Service: Ophthalmology;  Laterality: Left;  cassette lot# [id number]    [place] 00:27.7  AP 22.3 CDE  6.17  . Rotator cuff repair      SOCIAL HISTORY: Social History   Social History  . Marital Status: Divorced  Spouse Name: N/A  . Number of Children: N/A  . Years of Education: N/A   Occupational History  . Not on file.   Social History Main Topics  . Smoking  status: Former Research scientist (life sciences)  . Smokeless tobacco: Not on file  . Alcohol Use: No  . Drug Use: Not on file  . Sexual Activity: Not on file   Other Topics Concern  . Not on file   Social History Narrative    FAMILY HISTORY: Family History  Problem Relation Age of Onset  . Congestive Heart Failure Mother     ALLERGIES:  has No Known Allergies.  MEDICATIONS:  Current Facility-Administered Medications  Medication Dose Route Frequency Provider Last Rate Last Dose  .  stroke: mapping our early stages of recovery book   Does not apply Once Costin M Gherghe, MD      . 0.9 %  sodium chloride infusion   Intravenous Once Collene Gobble, MD      . levETIRAcetam (KEPPRA) 1,000 mg in sodium chloride 0.9 % 100 mL IVPB  1,000 mg Intravenous Q12H Ram Fuller Mandril, MD      . senna-docusate (Senokot-S) tablet 1 tablet  1 tablet Oral QHS PRN Costin Karlyne Greenspan, MD        REVIEW OF SYSTEMS:  Unable to assess due to change in mental status  PHYSICAL EXAMINATION: ECOG PERFORMANCE STATUS: 4 - Bedbound  Filed Vitals:   04/22/2016 1900 04/23/2016 2000  BP: 101/60   Pulse: 98 103  Temp:    Resp: 20 19   There were no vitals filed for this visit.  GENERAL:The patient was not alert and difficult to arouse SKIN: skin color, texture, turgor are normal, no rashes or significant lesions EYES: Unable to assess as they were closed  OROPHARYNX:no exudate, no erythema and lips, buccal mucosa, and tongue normal  NECK: supple, thyroid normal size, non-tender, without nodularity LYMPH:  no palpable lymphadenopathy in the cervical, axillary or inguinal LUNGS: clear to auscultation and percussion with normal breathing effort HEART: regular rate & rhythm and no murmurs and no lower extremity edema ABDOMEN:abdomen soft, non-tender and normal bowel sounds Musculoskeletal:no cyanosis of digits and no clubbing  PSYCH: obtunded NEURO: Unable to assess  LABORATORY DATA:  I have reviewed the data as  listed Recent Results (from the past 2160 hour(s))  Basic metabolic panel     Status: Abnormal   Collection Time: 02/16/16 10:05 AM  Result Value Ref Range   Sodium 139 135 - 145 mmol/L   Potassium 3.4 (L) 3.5 - 5.1 mmol/L   Chloride 109 101 - 111 mmol/L   CO2 25 22 - 32 mmol/L   Glucose, Bld 101 (H) 65 - 99 mg/dL   BUN 14 6 - 20 mg/dL   Creatinine, Ser 1.17 (H) 0.44 - 1.00 mg/dL   Calcium 9.2 8.9 - 10.3 mg/dL   GFR calc non Af Amer 45 (L) >60 mL/min   GFR calc Af Amer 53 (L) >60 mL/min    Comment: (NOTE) The eGFR has been calculated using the CKD EPI equation. This calculation has not been validated in all clinical situations. eGFR's persistently <60 mL/min signify possible Chronic Kidney Disease.    Anion gap 5 5 - 15  CBC     Status: Abnormal   Collection Time: 02/16/16 10:05 AM  Result Value Ref Range   WBC 7.3 3.6 - 11.0 K/uL   RBC 3.78 (L) 3.80 - 5.20 MIL/uL   Hemoglobin 11.0 (L) 12.0 -  16.0 g/dL   HCT 32.8 (L) 35.0 - 47.0 %   MCV 86.8 80.0 - 100.0 fL   MCH 29.1 26.0 - 34.0 pg   MCHC 33.5 32.0 - 36.0 g/dL   RDW 15.1 (H) 11.5 - 14.5 %   Platelets 116 (L) 150 - 440 K/uL  Troponin I     Status: None   Collection Time: 02/16/16 10:05 AM  Result Value Ref Range   Troponin I 0.03 <0.031 ng/mL    Comment:        NO INDICATION OF MYOCARDIAL INJURY.   VITAMIN D 25 Hydroxy (Vit-D Deficiency, Fractures)     Status: Abnormal   Collection Time: 02/16/16 10:05 AM  Result Value Ref Range   Vit D, 25-Hydroxy 6.6 (L) 30.0 - 100.0 ng/mL    Comment: (NOTE) Vitamin D deficiency has been defined by the [place] practice guideline as a level of serum 25-OH vitamin D less than 20 ng/mL (1,2). The Endocrine Society went on to further define vitamin D insufficiency as a level between 21 and 29 ng/mL (2). 1. IOM (Institute of Medicine). 2010. Dietary reference   intakes for calcium and D. [place]: The   [employer]. 2. Holick  MF, Binkley Sheldon, Bischoff-Ferrari HA, et al.   Evaluation, treatment, and prevention of vitamin D   deficiency: an Endocrine Society clinical practice   guideline. JCEM. 2011 Jul; 96(7):1911-30. Performed At: [facility] [place], [place] [id number] Lindon Romp MD WK:[id number]   Vitamin B12     Status: Abnormal   Collection Time: 02/16/16 10:05 AM  Result Value Ref Range   Vitamin B-12 5660 (H) 180 - 914 pg/mL    Comment: (NOTE) This assay is not validated for testing neonatal or myeloproliferative syndrome specimens for Vitamin B12 levels. Performed at [facility]   Hemoglobin A1c     Status: None   Collection Time: 02/16/16 10:05 AM  Result Value Ref Range   Hgb A1c MFr Bld 5.0 4.0 - 6.0 %  TSH     Status: None   Collection Time: 02/16/16 10:05 AM  Result Value Ref Range   TSH 1.164 0.350 - 4.500 uIU/mL  Urinalysis complete, with microscopic ([facility] only)     Status: Abnormal   Collection Time: 02/16/16  1:13 PM  Result Value Ref Range   Color, Urine STRAW (A) YELLOW   APPearance CLEAR (A) CLEAR   Glucose, UA NEGATIVE NEGATIVE mg/dL   Bilirubin Urine NEGATIVE NEGATIVE   Ketones, ur TRACE (A) NEGATIVE mg/dL   Specific Gravity, Urine 1.008 1.005 - 1.030   Hgb urine dipstick NEGATIVE NEGATIVE   pH 7.0 5.0 - 8.0   Protein, ur NEGATIVE NEGATIVE mg/dL   Nitrite NEGATIVE NEGATIVE   Leukocytes, UA NEGATIVE NEGATIVE   RBC / HPF 0-5 0 - 5 RBC/hpf   WBC, UA 0-5 0 - 5 WBC/hpf   Bacteria, UA NONE SEEN NONE SEEN   Squamous Epithelial / LPF 0-5 (A) NONE SEEN   Mucous PRESENT   CBC     Status: Abnormal   Collection Time: 02/17/16  4:54 AM  Result Value Ref Range   WBC 8.3 3.6 - 11.0 K/uL   RBC 3.81 3.80 - 5.20 MIL/uL   Hemoglobin 11.4 (L) 12.0 - 16.0 g/dL   HCT 33.5 (L) 35.0 - 47.0 %   MCV 88.0 80.0 - 100.0 fL   MCH 30.0 26.0 - 34.0 pg   MCHC 34.1  32.0 - 36.0 g/dL   RDW 15.0 (H) 11.5 - 14.5 %   Platelets 106 (L) 150 - 440 K/uL  Basic  metabolic panel     Status: Abnormal   Collection Time: 02/17/16  4:54 AM  Result Value Ref Range   Sodium 140 135 - 145 mmol/L   Potassium 2.9 (LL) 3.5 - 5.1 mmol/L    Comment: CRITICAL RESULT CALLED TO, READ BACK BY AND VERIFIED WITH Melissa Cobb @ 4174 02/17/16 by [facility]    Chloride 109 101 - 111 mmol/L   CO2 26 22 - 32 mmol/L   Glucose, Bld 92 65 - 99 mg/dL   BUN 11 6 - 20 mg/dL   Creatinine, Ser 1.06 (H) 0.44 - 1.00 mg/dL   Calcium 8.8 (L) 8.9 - 10.3 mg/dL   GFR calc non Af Amer 51 (L) >60 mL/min   GFR calc Af Amer 59 (L) >60 mL/min    Comment: (NOTE) The eGFR has been calculated using the CKD EPI equation. This calculation has not been validated in all clinical situations. eGFR's persistently <60 mL/min signify possible Chronic Kidney Disease.    Anion gap 5 5 - 15  Lipid panel     Status: Abnormal   Collection Time: 02/17/16  4:54 AM  Result Value Ref Range   Cholesterol 123 0 - 200 mg/dL   Triglycerides 32 <150 mg/dL   HDL 39 (L) >40 mg/dL   Total CHOL/HDL Ratio 3.2 RATIO   VLDL 6 0 - 40 mg/dL   LDL Cholesterol 78 0 - 99 mg/dL    Comment:        Total Cholesterol/HDL:CHD Risk Coronary Heart Disease Risk Table                     Men   Women  1/2 Average Risk   3.4   3.3  Average Risk       5.0   4.4  2 X Average Risk   9.6   7.1  3 X Average Risk  23.4   11.0        Use the calculated Patient Ratio above and the CHD Risk Table to determine the patient's CHD Risk.        ATP III CLASSIFICATION (LDL):  <100     mg/dL   Optimal  100-129  mg/dL   Near or Above                    Optimal  130-159  mg/dL   Borderline  160-189  mg/dL   High  >190     mg/dL   Very High   Potassium     Status: None   Collection Time: 02/18/16  5:15 AM  Result Value Ref Range   Potassium 3.5 3.5 - 5.1 mmol/L  Magnesium     Status: None   Collection Time: 02/18/16  5:15 AM  Result Value Ref Range   Magnesium 1.9 1.7 - 2.4 mg/dL  CBC with Differential     Status: Abnormal    Collection Time: 04/30/2016  8:36 AM  Result Value Ref Range   WBC 11.0 3.6 - 11.0 K/uL   RBC 2.98 (L) 3.80 - 5.20 MIL/uL   Hemoglobin 8.9 (L) 12.0 - 16.0 g/dL   HCT 25.8 (L) 35.0 - 47.0 %   MCV 86.6 80.0 - 100.0 fL   MCH 29.9 26.0 - 34.0 pg   MCHC 34.5 32.0 - 36.0 g/dL   RDW 15.6 (  H) 11.5 - 14.5 %   Platelets 13 (LL) 150 - 440 K/uL    Comment: CRITICAL RESULT CALLED TO, READ BACK BY AND VERIFIED WITH: CALLED JENIE BOWEN_0  ON 04/05/2016 BY [facility] PLATELET COUNT CONFIRMED BY SMEAR    Neutrophils Relative % 72% %   Neutro Abs 7.9 (H) 1.4 - 6.5 K/uL   Lymphocytes Relative 16% %   Lymphs Abs 1.8 1.0 - 3.6 K/uL   Monocytes Relative 10% %   Monocytes Absolute 1.1 (H) 0.2 - 0.9 K/uL   Eosinophils Relative 1% %   Eosinophils Absolute 0.1 0 - 0.7 K/uL   Basophils Relative 1% %   Basophils Absolute 0.1 0 - 0.1 K/uL  Comprehensive metabolic panel     Status: Abnormal   Collection Time: 04/08/2016  8:36 AM  Result Value Ref Range   Sodium 139 135 - 145 mmol/L   Potassium 3.7 3.5 - 5.1 mmol/L   Chloride 108 101 - 111 mmol/L   CO2 23 22 - 32 mmol/L   Glucose, Bld 120 (H) 65 - 99 mg/dL   BUN 20 6 - 20 mg/dL   Creatinine, Ser 1.36 (H) 0.44 - 1.00 mg/dL   Calcium 9.1 8.9 - 10.3 mg/dL   Total Protein 7.6 6.5 - 8.1 g/dL   Albumin 3.8 3.5 - 5.0 g/dL   AST 56 (H) 15 - 41 U/L   ALT 20 14 - 54 U/L   Alkaline Phosphatase 53 38 - 126 U/L   Total Bilirubin 3.0 (H) 0.3 - 1.2 mg/dL   GFR calc non Af Amer 38 (L) >60 mL/min   GFR calc Af Amer 44 (L) >60 mL/min    Comment: (NOTE) The eGFR has been calculated using the CKD EPI equation. This calculation has not been validated in all clinical situations. eGFR's persistently <60 mL/min signify possible Chronic Kidney Disease.    Anion gap 8 5 - 15  Troponin I     Status: Abnormal   Collection Time: 04/03/2016  8:36 AM  Result Value Ref Range   Troponin I 2.16 (H) <0.031 ng/mL    Comment: READ BACK AND VERIFIED WITH JANNIE BOWDEN AT 6979 04/20/2016 DAS         POSSIBLE MYOCARDIAL ISCHEMIA. SERIAL TESTING RECOMMENDED.   Lipase, blood     Status: None   Collection Time: 04/08/2016  8:36 AM  Result Value Ref Range   Lipase 26 11 - 51 U/L  Urinalysis complete, with microscopic ([facility] only)     Status: Abnormal   Collection Time: 04/25/2016  8:36 AM  Result Value Ref Range   Color, Urine YELLOW (A) YELLOW   APPearance CLEAR (A) CLEAR   Glucose, UA NEGATIVE NEGATIVE mg/dL   Bilirubin Urine NEGATIVE NEGATIVE   Ketones, ur NEGATIVE NEGATIVE mg/dL   Specific Gravity, Urine 1.009 1.005 - 1.030   Hgb urine dipstick 3+ (A) NEGATIVE   pH 6.0 5.0 - 8.0   Protein, ur 30 (A) NEGATIVE mg/dL   Nitrite NEGATIVE NEGATIVE   Leukocytes, UA NEGATIVE NEGATIVE   RBC / HPF 0-5 0 - 5 RBC/hpf   WBC, UA 0-5 0 - 5 WBC/hpf   Bacteria, UA NONE SEEN NONE SEEN   Squamous Epithelial / LPF 0-5 (A) NONE SEEN  Pathologist smear review     Status: None   Collection Time: 04/26/2016  8:36 AM  Result Value Ref Range   Path Review      Peripheral smear reveals normocytic anemia with schistocytes and thrombocytopenia.  Comment: Patient with recent stroke and baseline thrombocytopenia. The presence of schistocytes is compatible with intravascular fibrin deposition, possibly related to the patient's known recent stroke. Considerations for critical thrombocytopenia include drug.  These findings were communicated to Dr. Leslye Peer on 04/26/2016 at 12:50 PM. Reviewed by Dellia Nims. Reuel Derby, M.D.     RADIOGRAPHIC STUDIES: I have personally reviewed the radiological images as listed and agreed with the findings in the report. Ct Head Wo Contrast  04/26/2016  CLINICAL DATA:  Vomiting beginning last night which is persistent. Weakness. History of old strokes. EXAM: CT HEAD WITHOUT CONTRAST TECHNIQUE: Contiguous axial images were obtained from the base of the skull through the vertex without intravenous contrast. COMPARISON:  01/01/2016.  02/16/2016. FINDINGS: New infarctions are seen within  the inferior cerebellum, left more than right. There is mild swelling. No hemorrhage. Old cerebellar infarctions are present. Old pontine infarction. Old right occipital infarction. Old left frontoparietal infarction. Old right posterior frontal infarction. Chronic small-vessel disease within the deep white matter. No mass lesion, hydrocephalus or extra-axial collection. No calvarial abnormality. No fluid in the sinuses. IMPRESSION: Extensive old ischemic changes throughout the brain. New/subacute infarction at the inferior cerebellum left more than right. No mass effect or hemorrhage. Electronically Signed   By: Nelson Chimes M.D.   On: 04/27/2016 09:30   Ct Abdomen Pelvis W Contrast  04/03/2016  CLINICAL DATA:  Dementia and altered mental status. Vomiting beginning last night. EXAM: CT ABDOMEN AND PELVIS WITH CONTRAST TECHNIQUE: Multidetector CT imaging of the abdomen and pelvis was performed using the standard protocol following bolus administration of intravenous contrast. CONTRAST:  75 cc Isovue 370 COMPARISON:  01/01/2016 FINDINGS: The liver has a normal appearance. There are gallstones layering dependent within the gallbladder. No CT evidence of cholecystitis or obstruction. The spleen is normal. The pancreas is normal. The adrenal glands are normal. Small benign renal cysts bilaterally. No mass, stone or hydronephrosis. There is atherosclerosis of the aorta but no aneurysm. Bilateral renal artery stenosis is present. No retroperitoneal mass or adenopathy. No free intraperitoneal fluid or air. Uterus and adnexal regions appear unremarkable. No primary bowel pathology. Chronic degenerative changes affect the spine, with 4 mm of anterolisthesis at L4-5 because of degenerative facet disease. IMPRESSION: No acute abdominal or pelvic finding. Atherosclerosis of the aorta and its branch vessels. Cholelithiasis without CT evidence of cholecystitis or obstruction. Electronically Signed   By: Nelson Chimes M.D.   On:  04/25/2016 11:03   Dg Chest Portable 1 View  04/30/2016  CLINICAL DATA:  Altered mental status and vomiting for 1 day, former smoker EXAM: PORTABLE CHEST 1 VIEW COMPARISON:  01/01/2016 FINDINGS: Limited inspiratory effect. Heart size upper normal. Vascular pattern normal. Mild diffuse interstitial prominence stable. IMPRESSION: Chronic interstitial disease.  No acute findings. Electronically Signed   By: Skipper Cliche M.D.   On: 04/08/2016 09:02   Ct Angio Chest Aorta W/cm &/or Wo/cm  04/20/2016  CLINICAL DATA:  Dementia and altered mental status. Vomiting beginning last night. EXAM: CT ANGIOGRAPHY CHEST WITH CONTRAST TECHNIQUE: Multidetector CT imaging of the chest was performed using the standard protocol during bolus administration of intravenous contrast. Multiplanar CT image reconstructions and MIPs were obtained to evaluate the vascular anatomy. CONTRAST:  75 cc Isovue 370 COMPARISON:  Radiography same day. FINDINGS: Arterial opacification is excellent. There is atherosclerosis of the aorta but no aneurysm or dissection. Calcification of the brachiocephalic vessel origins but without evidence of flow-limiting stenosis. Pulmonary arteries appear normal without emboli. Coronary artery calcification  is present. No pleural or pericardial fluid. There is pulmonary scarring with emphysema. No infiltrate or collapse. No mediastinal mass or lymphadenopathy. Scans in the upper abdomen show gallstones dependent within the gallbladder but no active process. Atherosclerosis of the upper abdominal aorta and its branch vessels without aneurysm. Bilateral renal artery stenosis. Review of the MIP images confirms the above findings. IMPRESSION: No dissection. No pulmonary emboli. Atherosclerosis of the aorta and its branch vessels. Coronary artery calcifications. Bilateral renal artery stenoses. Emphysema and pulmonary scarring. No active chest pathology otherwise. Electronically Signed   By: Nelson Chimes M.D.   On:  04/11/2016 10:59    ASSESSMENT & PLAN  Acute on chronic thrombocytopenia The cause is unknown. Recent vitamin B-12 level was adequate. The patient has no known liver disease although total bilirubin has been chronically elevated since February 2017, in the absence of concurrent elevation of other liver enzymes I am not able to review any peripheral smear as the patient has no IV access and peripheral blood draw was impossible. According to outside facility evaluation, the local pathologist had reviewed the peripheral smear and reported schistocytes Differential diagnosis could be potential DIC versus possible TTP I have ordered additional workup including coagulation study I am not able to draw any conclusion or give final recommendation until further results are available I support that decision to give platelet transfusion to obtain central line access If repeat labs suggests evidence to suggest TTP, she might benefit from urgent plasmapheresis at night The hemodialysis unit is alerted about that possibility  Acute anemia with elevated bilirubin, suspect hemolysis Certainly, hemolysis is a possibility I will order additional workup for this She does not need transfusion support at this point in time. There were no reported signs of bleeding  Mild acute on chronic renal failure Could be related to dehydration in view of recent poor oral intake and nausea CT scan show evidence of bilateral renal artery stenosis but no other pathology related to renal obstruction  Altered mental status with recent witnessed seizure Appreciate neurology consultation The patient may need intubation for airway protection but will defer to primary service  Elevated troponin Cause is unknown but acute coronary event on background history of ischemic heart disease is a possibility given history of smoking. TTP can also cause possibility of thrombotic complications With significant thrombocytopenia, heparin  is contraindicated at this point  Discharge planning The patient is gravely ill. She will remain in ICU for now  I will follow blood test results once they are available. I will return tomorrow morning to check on her.  [facility], Aison Malveaux, MD 04/06/2016

## 2016-04-04 NOTE — Procedures (Signed)
Intubation Procedure Note Laura Levy [id number] [birth year]  Procedure: Intubation Indications: Airway protection and maintenance  Procedure Details Consent: Risks of procedure as well as the alternatives and risks of each were explained to the (patient/caregiver).  Consent for procedure obtained. Time Out: Verified patient identification, verified procedure, site/side was marked, verified correct patient position, special equipment/implants available, medications/allergies/relevent history reviewed, required imaging and test results available.  Performed  Maximum sterile technique was used including gloves, hand hygiene and mask.  MAC and 3 Glidescope    Evaluation Hemodynamic Status: BP stable throughout; O2 sats: stable throughout Patient's Current Condition: stable Complications: No apparent complications Patient did tolerate procedure well. Chest X-ray ordered to verify placement.  CXR: pending.   Laura Levy S. 04/22/2016

## 2016-04-05 ENCOUNTER — Other Ambulatory Visit (HOSPITAL_COMMUNITY): Payer: Medicare Other

## 2016-04-05 ENCOUNTER — Inpatient Hospital Stay (HOSPITAL_COMMUNITY): Payer: Medicare Other

## 2016-04-05 ENCOUNTER — Other Ambulatory Visit: Payer: Self-pay | Admitting: Hematology and Oncology

## 2016-04-05 DIAGNOSIS — J9600 Acute respiratory failure, unspecified whether with hypoxia or hypercapnia: Secondary | ICD-10-CM

## 2016-04-05 DIAGNOSIS — I6789 Other cerebrovascular disease: Secondary | ICD-10-CM

## 2016-04-05 LAB — THERAPEUTIC PLASMA EXCHANGE (BLOOD BANK)
PLASMA EXCHANGE: 2538
PLASMA VOLUME NEEDED: 2541
Unit division: 0
Unit division: 0
Unit division: 0
Unit division: 0
Unit division: 0
Unit division: 0
Unit division: 0
Unit division: 0

## 2016-04-05 LAB — CBC WITH DIFFERENTIAL/PLATELET
BASOS ABS: 0 10*3/uL (ref 0.0–0.1)
BASOS PCT: 0 %
EOS PCT: 1 %
Eosinophils Absolute: 0.3 10*3/uL (ref 0.0–0.7)
HEMATOCRIT: 21.6 % — AB (ref 36.0–46.0)
Hemoglobin: 7 g/dL — ABNORMAL LOW (ref 12.0–15.0)
LYMPHS PCT: 14 %
Lymphs Abs: 2.8 10*3/uL (ref 0.7–4.0)
MCH: 28.7 pg (ref 26.0–34.0)
MCHC: 32.4 g/dL (ref 30.0–36.0)
MCV: 88.5 fL (ref 78.0–100.0)
MONO ABS: 2 10*3/uL — AB (ref 0.1–1.0)
Monocytes Relative: 10 %
NEUTROS ABS: 15.3 10*3/uL — AB (ref 1.7–7.7)
Neutrophils Relative %: 75 %
PLATELETS: 128 10*3/uL — AB (ref 150–400)
RBC: 2.44 MIL/uL — AB (ref 3.87–5.11)
RDW: 16.5 % — AB (ref 11.5–15.5)
WBC: 20.3 10*3/uL — AB (ref 4.0–10.5)

## 2016-04-05 LAB — ECHOCARDIOGRAM COMPLETE
Height: 63 in
Weight: 2296.31 oz

## 2016-04-05 LAB — LIPID PANEL
Cholesterol: 120 mg/dL (ref 0–200)
HDL: 37 mg/dL — AB (ref >40)
LDL Cholesterol: 72 mg/dL (ref 0–99)
TRIGLYCERIDES: 57 mg/dL (ref <150)
Total CHOL/HDL Ratio: 3.2 RATIO
VLDL: 11 mg/dL (ref 0–40)

## 2016-04-05 LAB — CBC
HCT: 26.2 % — ABNORMAL LOW (ref 36.0–46.0)
Hemoglobin: 8.8 g/dL — ABNORMAL LOW (ref 12.0–15.0)
MCH: 29.2 pg (ref 26.0–34.0)
MCHC: 33.6 g/dL (ref 30.0–36.0)
MCV: 87 fL (ref 78.0–100.0)
PLATELETS: 109 10*3/uL — AB (ref 150–400)
RBC: 3.01 MIL/uL — AB (ref 3.87–5.11)
RDW: 16.6 % — AB (ref 11.5–15.5)
WBC: 22.4 10*3/uL — AB (ref 4.0–10.5)

## 2016-04-05 LAB — BILIRUBIN, DIRECT: BILIRUBIN DIRECT: 0.5 mg/dL (ref 0.1–0.5)

## 2016-04-05 LAB — PREPARE PLATELET PHERESIS
UNIT DIVISION: 0
Unit division: 0

## 2016-04-05 LAB — TROPONIN I
TROPONIN I: 3.9 ng/mL — AB (ref <0.031)
Troponin I: 5 ng/mL (ref <0.031)

## 2016-04-05 LAB — PREPARE RBC (CROSSMATCH)

## 2016-04-05 LAB — SAVE SMEAR

## 2016-04-05 MED ORDER — ACD FORMULA A 0.73-2.45-2.2 GM/100ML VI SOLN
Status: AC
  Filled 2016-04-05: qty 1000

## 2016-04-05 MED ORDER — SODIUM CHLORIDE 0.9 % IV SOLN
Freq: Once | INTRAVENOUS | Status: AC
  Administered 2016-04-05: 10 mL/h via INTRAVENOUS

## 2016-04-05 MED ORDER — DIPHENHYDRAMINE HCL 25 MG PO CAPS
25.0000 mg | ORAL_CAPSULE | Freq: Four times a day (QID) | ORAL | Status: DC | PRN
Start: 2016-04-06 — End: 2016-04-05

## 2016-04-05 MED ORDER — ACD FORMULA A 0.73-2.45-2.2 GM/100ML VI SOLN
Status: AC
  Filled 2016-04-05: qty 500

## 2016-04-05 MED ORDER — CALCIUM GLUCONATE 10 % IV SOLN
4.0000 g | Freq: Once | INTRAVENOUS | Status: DC
  Filled 2016-04-05: qty 40

## 2016-04-05 MED ORDER — ACD FORMULA A 0.73-2.45-2.2 GM/100ML VI SOLN
500.0000 mL | Status: DC
  Filled 2016-04-05: qty 500

## 2016-04-05 MED ORDER — ACETAMINOPHEN 325 MG PO TABS: 650.0000 mg | ORAL_TABLET | ORAL | Status: DC | PRN

## 2016-04-05 MED ORDER — DIPHENHYDRAMINE HCL 25 MG PO CAPS: 25.0000 mg | ORAL_CAPSULE | Freq: Four times a day (QID) | ORAL | Status: DC | PRN

## 2016-04-05 MED ORDER — DEXTROSE 5 % IV SOLN
2.0000 ug/min | INTRAVENOUS | Status: DC
  Administered 2016-04-05: 30 ug/min via INTRAVENOUS
  Administered 2016-04-05: 25 ug/min via INTRAVENOUS
  Administered 2016-04-05: 4 ug/min via INTRAVENOUS
  Administered 2016-04-05: 25 ug/min via INTRAVENOUS
  Administered 2016-04-05: 22 ug/min via INTRAVENOUS
  Administered 2016-04-05 – 2016-04-06 (×4): 20 ug/min via INTRAVENOUS
  Administered 2016-04-06: 18 ug/min via INTRAVENOUS
  Administered 2016-04-06: 16 ug/min via INTRAVENOUS
  Administered 2016-04-06 (×2): 20 ug/min via INTRAVENOUS
  Administered 2016-04-06: 16 ug/min via INTRAVENOUS
  Administered 2016-04-07: 23 ug/min via INTRAVENOUS
  Administered 2016-04-07: 20 ug/min via INTRAVENOUS
  Administered 2016-04-07: 23 ug/min via INTRAVENOUS
  Filled 2016-04-05 (×17): qty 4

## 2016-04-05 MED ORDER — ANTICOAGULANT SODIUM CITRATE 4% (200MG/5ML) IV SOLN
5.0000 mL | Freq: Once | Status: DC
  Filled 2016-04-05: qty 250

## 2016-04-05 MED ORDER — ANTISEPTIC ORAL RINSE SOLUTION (CORINZ)
7.0000 mL | OROMUCOSAL | Status: DC
  Administered 2016-04-05 – 2016-04-13 (×86): 7 mL via OROMUCOSAL

## 2016-04-05 MED ORDER — CHLORHEXIDINE GLUCONATE 0.12% ORAL RINSE (MEDLINE KIT)
15.0000 mL | Freq: Two times a day (BID) | OROMUCOSAL | Status: DC
  Administered 2016-04-05 – 2016-04-13 (×17): 15 mL via OROMUCOSAL

## 2016-04-05 MED ORDER — ACD FORMULA A 0.73-2.45-2.2 GM/100ML VI SOLN: 500.0000 mL | Status: DC

## 2016-04-05 MED ORDER — ANTICOAGULANT SODIUM CITRATE 4% (200MG/5ML) IV SOLN
5.0000 mL | Freq: Once | Status: DC
  Filled 2016-04-05 (×2): qty 250

## 2016-04-05 MED ORDER — CALCIUM GLUCONATE 10 % IV SOLN
2.0000 g | Freq: Once | INTRAVENOUS | Status: DC
Start: 2016-04-06 — End: 2016-04-14
  Filled 2016-04-05: qty 20

## 2016-04-05 NOTE — Progress Notes (Signed)
  Echocardiogram 2D Echocardiogram has been performed.  Laura Levy 04/05/2016, 12:12 PM

## 2016-04-05 NOTE — Progress Notes (Signed)
EEG Completed; Results Pending  

## 2016-04-05 NOTE — Progress Notes (Signed)
Laura Levy   DOB:08-15-1941   ZO#:[id number]    Subjective: The patient had placement of central line and received emergent plasmapheresis until 4:30 this morning. There were no reported seizure activities.The patient denies any recent signs or symptoms of bleeding such as spontaneous epistaxis, hematuria or hematochezia. She is placed on pressure support.   Objective:  Filed Vitals:   04/05/16 0630 04/05/16 0645  BP: 105/53 109/56  Pulse: 90 91  Temp:    Resp: 16 18     Intake/Output Summary (Last 24 hours) at 04/05/16 0720 Last data filed at 04/05/16 0617  Gross per 24 hour  Intake 1985.09 ml  Output    310 ml  Net 1675.09 ml    GENERAL:The patient is sedated, intubated and ventilated. SKIN: skin color, texture, turgor are normal, no rashes or significant lesions EYES: Closed  LUNGS: clear to auscultation and percussion with normal breathing effort HEART: regular rate & rhythm and no murmurs and no lower extremity edema   Labs:  Lab Results  Component Value Date   WBC 20.3* 04/05/2016   HGB 7.0* 04/05/2016   HCT 21.6* 04/05/2016   MCV 88.5 04/05/2016   PLT 128* 04/05/2016   NEUTROABS 15.3* 04/05/2016    Lab Results  Component Value Date   NA 140 04/23/2016   K 4.2 04/23/16   CL 108 April 23, 2016   CO2 20* 2016-04-23    Studies:  Ct Head Wo Contrast  2016-04-23  CLINICAL DATA:  Vomiting beginning last night which is persistent. Weakness. History of old strokes. EXAM: CT HEAD WITHOUT CONTRAST TECHNIQUE: Contiguous axial images were obtained from the base of the skull through the vertex without intravenous contrast. COMPARISON:  01/01/2016.  02/16/2016. FINDINGS: New infarctions are seen within the inferior cerebellum, left more than right. There is mild swelling. No hemorrhage. Old cerebellar infarctions are present. Old pontine infarction. Old right occipital infarction. Old left frontoparietal infarction. Old right posterior frontal infarction. Chronic small-vessel  disease within the deep white matter. No mass lesion, hydrocephalus or extra-axial collection. No calvarial abnormality. No fluid in the sinuses. IMPRESSION: Extensive old ischemic changes throughout the brain. New/subacute infarction at the inferior cerebellum left more than right. No mass effect or hemorrhage. Electronically Signed   By: Paulina Fusi M.D.   On: April 23, 2016 09:30   Ct Abdomen Pelvis W Contrast  2016/04/23  CLINICAL DATA:  Dementia and altered mental status. Vomiting beginning last night. EXAM: CT ABDOMEN AND PELVIS WITH CONTRAST TECHNIQUE: Multidetector CT imaging of the abdomen and pelvis was performed using the standard protocol following bolus administration of intravenous contrast. CONTRAST:  75 cc Isovue 370 COMPARISON:  01/01/2016 FINDINGS: The liver has a normal appearance. There are gallstones layering dependent within the gallbladder. No CT evidence of cholecystitis or obstruction. The spleen is normal. The pancreas is normal. The adrenal glands are normal. Small benign renal cysts bilaterally. No mass, stone or hydronephrosis. There is atherosclerosis of the aorta but no aneurysm. Bilateral renal artery stenosis is present. No retroperitoneal mass or adenopathy. No free intraperitoneal fluid or air. Uterus and adnexal regions appear unremarkable. No primary bowel pathology. Chronic degenerative changes affect the spine, with 4 mm of anterolisthesis at L4-5 because of degenerative facet disease. IMPRESSION: No acute abdominal or pelvic finding. Atherosclerosis of the aorta and its branch vessels. Cholelithiasis without CT evidence of cholecystitis or obstruction. Electronically Signed   By: Paulina Fusi M.D.   On: April 23, 2016 11:03   Dg Chest Port 1 View  2016-04-23  CLINICAL DATA:  Central line placement.  Initial encounter. EXAM: PORTABLE CHEST 1 VIEW COMPARISON:  Chest radiograph performed earlier today at 8:56 p.m. FINDINGS: The patient's endotracheal tube is seen ending 1-2 cm above  the carina. This could be retracted 1-2 cm. The right IJ line is noted ending about the mid SVC. The lungs are hypoexpanded. Vascular congestion is noted. Increased interstitial markings may reflect mild interstitial edema. No pleural effusion or pneumothorax is seen. The cardiomediastinal silhouette is borderline normal in size. No acute osseous abnormalities are identified. IMPRESSION: 1. Endotracheal tube seen ending 1-2 cm above the carina. This could be retracted 1-2 cm. 2. Right IJ line noted ending about the mid SVC. 3. Lungs hypoexpanded. Vascular congestion noted. Increased interstitial markings may reflect mild interstitial edema. Electronically Signed   By: Roanna Raider M.D.   On: 04/03/2016 23:03   Dg Chest Port 1 View  04/18/2016  CLINICAL DATA:  Acute respiratory failure EXAM: PORTABLE CHEST 1 VIEW COMPARISON:  04/08/2016 FINDINGS: Endotracheal tube is 17 mm above the carina. NG tube enters the stomach. Heart is normal size. No confluent airspace opacities or effusions. No acute bony abnormality. IMPRESSION: Endotracheal tube 17 mm above the carina. No acute findings. Electronically Signed   By: Charlett Nose M.D.   On: 04/19/2016 21:09   Dg Chest Portable 1 View  04/22/2016  CLINICAL DATA:  Altered mental status and vomiting for 1 day, former smoker EXAM: PORTABLE CHEST 1 VIEW COMPARISON:  01/01/2016 FINDINGS: Limited inspiratory effect. Heart size upper normal. Vascular pattern normal. Mild diffuse interstitial prominence stable. IMPRESSION: Chronic interstitial disease.  No acute findings. Electronically Signed   By: Esperanza Heir M.D.   On: 04/27/2016 09:02   Ct Angio Chest Aorta W/cm &/or Wo/cm  04/30/2016  CLINICAL DATA:  Dementia and altered mental status. Vomiting beginning last night. EXAM: CT ANGIOGRAPHY CHEST WITH CONTRAST TECHNIQUE: Multidetector CT imaging of the chest was performed using the standard protocol during bolus administration of intravenous contrast. Multiplanar CT  image reconstructions and MIPs were obtained to evaluate the vascular anatomy. CONTRAST:  75 cc Isovue 370 COMPARISON:  Radiography same day. FINDINGS: Arterial opacification is excellent. There is atherosclerosis of the aorta but no aneurysm or dissection. Calcification of the brachiocephalic vessel origins but without evidence of flow-limiting stenosis. Pulmonary arteries appear normal without emboli. Coronary artery calcification is present. No pleural or pericardial fluid. There is pulmonary scarring with emphysema. No infiltrate or collapse. No mediastinal mass or lymphadenopathy. Scans in the upper abdomen show gallstones dependent within the gallbladder but no active process. Atherosclerosis of the upper abdominal aorta and its branch vessels without aneurysm. Bilateral renal artery stenosis. Review of the MIP images confirms the above findings. IMPRESSION: No dissection. No pulmonary emboli. Atherosclerosis of the aorta and its branch vessels. Coronary artery calcifications. Bilateral renal artery stenoses. Emphysema and pulmonary scarring. No active chest pathology otherwise. Electronically Signed   By: Paulina Fusi M.D.   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 The patient has no known liver disease although total bilirubin has been chronically elevated since February 2017, in the absence of concurrent elevation of other liver enzymes Review of peripheral smear is suspicious for TTP and she received emergent plasmapheresis overnight. She has responded well to plasmapheresis with improvement of platelet count although some of the elevated platelet count could be related to recent platelet transfusion I recommend plasmapheresis again  tomorrow on Sunday as her plasmapheresis today just ended at 4:30 AM. I have placed orders sets and informed dialysis unit to make sure that she will get plasmapheresis in the  ICU tomorrow  Acute anemia with elevated bilirubin, suspect hemolysis Her hemoglobin is low and she has elevated troponin which could be signs of ischemia I recommend blood transfusion but would defer to primary service for final decision for transfusion   Mild acute on chronic renal failure Could be related to dehydration in view of recent poor oral intake and nausea CT scan show evidence of bilateral renal artery stenosis but no other pathology related to renal obstruction  Altered mental status with recent witnessed seizure Appreciate neurology consultation She is started on Keppra  Elevated troponin Cause is unknown but acute coronary event on background history of ischemic heart disease is a possibility given history of smoking. TTP can also cause possibility of thrombotic complications With improvement of platelet count, there is no contraindication for her to be placed on heparin or aspirin I highly recommend NOT to put her back on Plavix as it has been described as a potential cause for TTP  Discharge planning The patient is very ill. She will remain in ICU for now Dr. Aaron EdelmanMargrinat will resume care this weekend I will return on Monday for follow  [facility] LLCGORSUCH, Leovanni Bjorkman, MD 04/05/2016  7:20 AM

## 2016-04-05 NOTE — Progress Notes (Signed)
CRITICAL VALUE ALERT  Critical value received:  Troponin 3.43  Date of notification: 04/24/2016  Time of notification:  2153  Critical value read back:Yes.    Nurse who received alert:  Juleen Starraryn Damean Poffenberger, RN  MD notified (1st page):  Kasa  Time of first page:  2200  MD notified (2nd page):  Time of second page:  Responding MD:  Belia HemanKasa  Time MD responded: 2200

## 2016-04-05 NOTE — Progress Notes (Signed)
eLink Physician-Brief Progress Note Patient Name: Montine CircleVera M Suhre DOB: 1941/10/31 MRN: [id number]   Date of Service  04/05/2016  HPI/Events of Note  Neuro recs keep sbp>100  eICU Interventions  Will start levo     Intervention Category Major Interventions: Shock - evaluation and management  Chidi Shirer 04/05/2016, 1:55 AM

## 2016-04-05 NOTE — Progress Notes (Signed)
PT Cancellation Note  Patient Details Name: Laura Levy MRN: [id number] DOB: 1940/12/27   Cancelled Treatment:    Reason Eval/Treat Not Completed: Medical issues which prohibited therapy.  Intubated, sedated.  PT to check back on Monday 04/07/16.  Thanks,    Rollene Rotundaebecca B. Blayre Papania, PT, DPT [phone]   04/05/2016, 9:19 AM

## 2016-04-05 NOTE — Progress Notes (Signed)
Pt received 2 1L boluses per Dr. Delton CoombesByrum for hypotension. Stated to call Elink if pressure continued to be low. Spoke with Dr. Belia HemanKasa about BP goals. Instructed to call if MAP below 65. Upon reading notes, noted that neurology recommended that SBP be kept above 100 which patient had not been maintaining. Dr. Belia HemanKasa notified. Order received for levo. Will monitor.

## 2016-04-05 NOTE — Progress Notes (Signed)
Reviewed Dr Maxine GlennGorsuch's note and introduced myself to patient. No family in room. Will see again in AM.  Please let me know if I can be of help before then.

## 2016-04-05 NOTE — Progress Notes (Signed)
Initial Nutrition Assessment  DOCUMENTATION CODES:  Not applicable  INTERVENTION:  If unable to be extubated within 36 hrs, recommend TF.   - via OGT with Vital Af 1.2 at goal rate of 45 ml/h (1080 ml per day) to provide 1296 kcals, 81 gm protein, 876 ml free water daily.  NUTRITION DIAGNOSIS:  Inadequate oral intake related to inability to eat as evidenced by NPO status.  GOAL:  Patient will meet greater than or equal to 90% of their needs  MONITOR:  Diet advancement, Vent status, Labs, I & O's  REASON FOR ASSESSMENT:  Ventilator    ASSESSMENT:  75 y/o female PMHx HTN, Stroke, Dementia, presented w/ AMS, slurred speech, poor balance. CT scan showed bilateral cerebellar strokes. She was intubated after showing seizure activity and decline in wakefulness. Also with acute renal failure.  Sedated on Fentanyl. Pressors-norepi  Pt sedated, no family present.   Abdomen soft, non distended  Patient is currently intubated on ventilator support MV: 7.8 L/min Temp (24hrs), Avg:98.5 F (36.9 C), Min:97.7 F (36.5 C), Max:99.2 F (37.3 C)  Labs reviewed: mAP 74  Recent Labs Lab 09/05/2016 0836 09/05/2016 1658  NA 139 140  K 3.7 4.2  CL 108 108  CO2 23 20*  BUN 20 14  CREATININE 1.36* 1.35*  CALCIUM 9.1 9.0  GLUCOSE 120* 83   Diet Order:  Diet NPO time specified  Skin:Dry, open sore to labia  Last BM:  6/3  Height:  Ht Readings from Last 1 Encounters:  09/05/2016 5\' 3"  (1.6 m)   Weight:  Wt Readings from Last 1 Encounters:  04/05/16 143 lb 8.3 oz (65.1 kg)   Wt Readings from Last 10 Encounters:  04/05/16 143 lb 8.3 oz (65.1 kg)  09/05/2016 142 lb (64.411 kg)  02/17/16 142 lb 1.6 oz (64.456 kg)  01/01/16 147 lb 8 oz (66.906 kg)  05/29/15 155 lb (70.308 kg)  03/27/15 155 lb (70.308 kg)  Admit weight: 141 lbs  Ideal Body Weight:  52.27 kg  BMI:  Body mass index is 25.43 kg/(m^2).  Estimated Nutritional Needs:  Kcal:  1350 kcals Protein:  77-90 g (1.2-1.4  g/kg bw) Fluid:  1.9 liters  EDUCATION NEEDS:  No education needs identified at this time  Christophe LouisNathan Otie Headlee RD, LDN, CNSC Clinical Nutrition Pager: [phone] 04/05/2016 11:33 AM

## 2016-04-05 NOTE — Progress Notes (Signed)
SLP Cancellation Note  Patient Details Name: Laura Levy MRN: [id number] DOB: 13-Sep-1941   Cancelled treatment:       Reason Eval/Treat Not Completed: Medical issues which prohibited therapy; will monitor for extubation and PO readiness  Marcene Duoshelsea Sumney MA, CCC-SLP Acute Care Speech Language Pathologist    Kennieth RadSumney, Hilary Pundt E 04/05/2016, 7:43 AM

## 2016-04-05 NOTE — Progress Notes (Addendum)
PULMONARY / CRITICAL CARE MEDICINE   Name: Laura Levy MRN: [id number] DOB: 1941/10/28    ADMISSION DATE:  04/22/2016 CONSULTATION DATE:  04/03/2016   REFERRING MD:  Dr Elvera Lennox  CHIEF COMPLAINT:  Altermed MS and thrombocytopenia   HISTORY OF PRESENT ILLNESS:   Hx is obtained from notes and family, pt unable to give hx  75 y.o. female with medical history significant of hypertension, prior strokes, dementia, presented to [facility] emergency room 6/2 with chief complaint of altered mental status, slurred speech beginning the evening prior. CT scan showed subacute versus acute stroke bilateral cerebellum. 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 She transferred to [facility], has experienced progressive decline in wakefulness and airway protection. She was then noted to have rhythmic movements consistent w seizure activity, better after ativan. She moved to ICU and CCM  consulted to assist with her care.      SUBJECTIVE:  Pt unable to speak or give hx  VITAL SIGNS: BP 113/57 mmHg  Pulse 94  Temp(Src) 99.2 F (37.3 C) (Oral)  Resp 18  Ht 5\' 3"  (1.6 m)  Wt 143 lb 8.3 oz (65.1 kg)  BMI 25.43 kg/m2  SpO2 100%  HEMODYNAMICS:    VENTILATOR SETTINGS: Vent Mode:  [-] PRVC FiO2 (%):  [40 %-100 %] 40 % Set Rate:  [18 bmp] 18 bmp Vt Set:  [420 mL] 420 mL PEEP:  [5 cmH20] 5 cmH20 Plateau Pressure:  [18 cmH20-32 cmH20] 18 cmH20  INTAKE / OUTPUT: I/O last 3 completed shifts: In: 1985.1 [I.V.:386.1; Blood:489; IV Piggyback:1110] Out: 310 [Urine:310]  PHYSICAL EXAMINATION: General:  Ill appearing elderly woman, severe kyphosis Neuro:  Some grimace with pain, will not react to voice, no cough, leftward gaze HEENT:  Pooling secretions noted in OP, et to vent Cardiovascular:  Regular, no M Lungs:  Coarse BS B, no wheezes.  Abdomen:  Soft, NT, + BS Musculoskeletal:  No LE edema Skin:  Some tenting, no rash.    LABS:  BMET  Recent Labs Lab 04/28/2016 0836 04/07/2016 1658  NA 139 140  K 3.7 4.2  CL 108 108  CO2 23 20*  BUN 20 14  CREATININE 1.36* 1.35*  GLUCOSE 120* 83    Electrolytes  Recent Labs Lab 04/13/2016 0836 04/05/2016 1658  CALCIUM 9.1 9.0    CBC  Recent Labs Lab 04/30/2016 0836 04/30/2016 2035 04/05/16 0524  WBC 11.0 12.6* 20.3*  HGB 8.9* 9.6* 7.0*  HCT 25.8* 29.7* 21.6*  PLT 13* 14* 128*    Coag's  Recent Labs Lab 04/22/2016 2035  APTT 33  INR 1.40    Sepsis Markers No results for input(s): LATICACIDVEN, PROCALCITON, O2SATVEN in the last 168 hours.  ABG No results for input(s): PHART, PCO2ART, PO2ART in the last 168 hours.  Liver Enzymes  Recent Labs Lab 04/28/2016 0836 04/25/2016 1658  AST 56* 67*  ALT 20 23  ALKPHOS 53 52  BILITOT 3.0* 2.5*  ALBUMIN 3.8 3.3*    Cardiac Enzymes  Recent Labs Lab 04/11/2016 2035 04/05/16 0100 04/05/16 0524  TROPONINI 3.43* 5.00* 3.90*    Glucose No results for input(s): GLUCAP in the last 168 hours.  Imaging Ct Head Wo Contrast  04/15/2016  CLINICAL DATA:  Vomiting beginning last night which is persistent. Weakness. History of old strokes. EXAM: CT HEAD WITHOUT CONTRAST TECHNIQUE: Contiguous axial images were obtained from the base of the skull through the vertex without intravenous contrast. COMPARISON:  01/01/2016.  02/16/2016. FINDINGS: New infarctions are seen within the inferior cerebellum, left more than right. There is mild swelling. No hemorrhage. Old cerebellar infarctions are present. Old pontine infarction. Old right occipital infarction. Old left frontoparietal infarction. Old right posterior frontal infarction. Chronic small-vessel disease within the deep white matter. No mass lesion, hydrocephalus or extra-axial collection. No calvarial abnormality. No fluid in the sinuses. IMPRESSION: Extensive old ischemic changes throughout the brain. New/subacute infarction at the inferior cerebellum left more than  right. No mass effect or hemorrhage. Electronically Signed   By: Paulina Fusi M.D.   On: 04/26/2016 09:30   Ct Abdomen Pelvis W Contrast  04/27/2016  CLINICAL DATA:  Dementia and altered mental status. Vomiting beginning last night. EXAM: CT ABDOMEN AND PELVIS WITH CONTRAST TECHNIQUE: Multidetector CT imaging of the abdomen and pelvis was performed using the standard protocol following bolus administration of intravenous contrast. CONTRAST:  75 cc Isovue 370 COMPARISON:  01/01/2016 FINDINGS: The liver has a normal appearance. There are gallstones layering dependent within the gallbladder. No CT evidence of cholecystitis or obstruction. The spleen is normal. The pancreas is normal. The adrenal glands are normal. Small benign renal cysts bilaterally. No mass, stone or hydronephrosis. There is atherosclerosis of the aorta but no aneurysm. Bilateral renal artery stenosis is present. No retroperitoneal mass or adenopathy. No free intraperitoneal fluid or air. Uterus and adnexal regions appear unremarkable. No primary bowel pathology. Chronic degenerative changes affect the spine, with 4 mm of anterolisthesis at L4-5 because of degenerative facet disease. IMPRESSION: No acute abdominal or pelvic finding. Atherosclerosis of the aorta and its branch vessels. Cholelithiasis without CT evidence of cholecystitis or obstruction. Electronically Signed   By: Paulina Fusi M.D.   On: 04/28/2016 11:03   Dg Chest Port 1 View  04/11/2016  CLINICAL DATA:  Central line placement.  Initial encounter. EXAM: PORTABLE CHEST 1 VIEW COMPARISON:  Chest radiograph performed earlier today at 8:56 p.m. FINDINGS: The patient's endotracheal tube is seen ending 1-2 cm above the carina. This could be retracted 1-2 cm. The right IJ line is noted ending about the mid SVC. The lungs are hypoexpanded. Vascular congestion is noted. Increased interstitial markings may reflect mild interstitial edema. No pleural effusion or pneumothorax is seen. The  cardiomediastinal silhouette is borderline normal in size. No acute osseous abnormalities are identified. IMPRESSION: 1. Endotracheal tube seen ending 1-2 cm above the carina. This could be retracted 1-2 cm. 2. Right IJ line noted ending about the mid SVC. 3. Lungs hypoexpanded. Vascular congestion noted. Increased interstitial markings may reflect mild interstitial edema. Electronically Signed   By: Roanna Raider M.D.   On: 04/29/2016 23:03   Dg Chest Port 1 View  04/29/2016  CLINICAL DATA:  Acute respiratory failure EXAM: PORTABLE CHEST 1 VIEW COMPARISON:  04/06/2016 FINDINGS: Endotracheal tube is 17 mm above the carina. NG tube enters the stomach. Heart is normal size. No confluent airspace opacities or effusions. No acute bony abnormality. IMPRESSION: Endotracheal tube 17 mm above the carina. No acute findings. Electronically Signed   By: Charlett Nose M.D.   On: 04/23/2016 21:09   Dg Chest Portable 1 View  04/19/2016  CLINICAL DATA:  Altered mental status and vomiting for 1 day, former smoker EXAM: PORTABLE CHEST 1 VIEW COMPARISON:  01/01/2016 FINDINGS: Limited inspiratory effect. Heart size upper normal. Vascular pattern normal. Mild diffuse interstitial prominence stable. IMPRESSION: Chronic interstitial disease.  No acute findings. Electronically Signed   By: Esperanza Heir M.D.   On:  02-Jul-2016 09:02   Ct Angio Chest Aorta W/cm &/or Wo/cm  04/20/2016  CLINICAL DATA:  Dementia and altered mental status. Vomiting beginning last night. EXAM: CT ANGIOGRAPHY CHEST WITH CONTRAST TECHNIQUE: Multidetector CT imaging of the chest was performed using the standard protocol during bolus administration of intravenous contrast. Multiplanar CT image reconstructions and MIPs were obtained to evaluate the vascular anatomy. CONTRAST:  75 cc Isovue 370 COMPARISON:  Radiography same day. FINDINGS: Arterial opacification is excellent. There is atherosclerosis of the aorta but no aneurysm or dissection. Calcification of  the brachiocephalic vessel origins but without evidence of flow-limiting stenosis. Pulmonary arteries appear normal without emboli. Coronary artery calcification is present. No pleural or pericardial fluid. There is pulmonary scarring with emphysema. No infiltrate or collapse. No mediastinal mass or lymphadenopathy. Scans in the upper abdomen show gallstones dependent within the gallbladder but no active process. Atherosclerosis of the upper abdominal aorta and its branch vessels without aneurysm. Bilateral renal artery stenosis. Review of the MIP images confirms the above findings. IMPRESSION: No dissection. No pulmonary emboli. Atherosclerosis of the aorta and its branch vessels. Coronary artery calcifications. Bilateral renal artery stenoses. Emphysema and pulmonary scarring. No active chest pathology otherwise. Electronically Signed   By: Paulina FusiMark  Shogry M.D.   On: 02-Jul-2016 10:59     STUDIES:  Head Ct 6/2 >> extensive chronic ischemic changes, new acute / subacute L > R cerebellar infarcts, no mass effect or bleed  CT chest 6/2 >> no dissection, no PE, emphysematous changes CT abdomen/ pelvis 6/2 >> cholelithiasis without evidence obstruction, no other acute findings.   CULTURES: None  ANTIBIOTICS: None  SIGNIFICANT EVENTS: Intubated 6/2 for airway protection 6/3 plasma phoresis  6/3 PRBC transfusion   LINES/TUBES: ETT 6/2 >>  6/2 rt i j hd cath>>  DISCUSSION: 75 yo woman with HTN, mild dementia, hx CVA's admitted 6/2 with acute altered MS. Eval has revealed B cerebellar CVA's, mild acute renal failure, severe thrombocytopenia w schistocytes, NSTEMI. Suspect cerebellar and cardiac ischemia due to microvascular disease and pro-thrombotic state as opposed to macroembolism. Entire picture very concerning for TTP. She has poor IV access, has not had labs drawn successfully. Goal to transfuse plts prior to HD catheter placement if needed. May not be possible if labs (type and screen) can't be  obtained.   ASSESSMENT / PLAN:  PULMONARY A: Acute respiratory failure due to inability to protect airway Presumed hx COPD given emphysema on Ct chest P:   ET intubation now for airway protection  Albuterol prn  CARDIOVASCULAR A:  Elevated troponin I, ? Due to microvascular thrombosis. Peaked at 5 and decreased to 3.9 on 6/3 Hx hypertension P:  ECG Follow serial troponin Hold anti-HTN regimen Consider TTE if troponin continues to rise of if she develops hypotension ? Heparin but platelets were 14 now 128 post plasma phoresis   RENAL Lab Results  Component Value Date   CREATININE 1.35* 02-Jul-2016   CREATININE 1.36* 02-Jul-2016   CREATININE 1.06* 02/17/2016   CREATININE 1.27 12/01/2013   CREATININE 1.11 08/11/2012    Recent Labs Lab 2016-03-09 0836 2016-03-09 1658  K 3.7 4.2     A:   Acute renal failure, note S Cr 1.06 in 02/2016 P:   Follow BMP, electrolytes Place foley to manage I/O's  GASTROINTESTINAL A:   Reported hx H pylori SUP P:   pepcid   HEMATOLOGIC A:    Recent Labs  2016-03-09 2035 04/05/16 0524  HGB 9.6* 7.0*    Severe thrombocytopenia, etiology  unclear but note episode of thrombocytopenia during her hosp in 3/'17 as well, consider drug induced (amoxicillin/clarithromycin) TTP Mild normocytic anemia P:  Hematology consulted.  Plasma phoresis started  0430 6/3 1st run, Per hematology Plan is another run Transfuse for hgb 7.0  INFECTIOUS A:   No clear source infxn at this time.  P:   Follow clinically, no abx for now Note possible rxn to amoxicillin or clarithromycin   ENDOCRINE A:   Hyperlipidemia    P:   Statin has been on hold for last few days (since her outpt abx were started)  NEUROLOGIC A:   Acute cerebellar CVA's, ? Possible contribution of thrombocytopenia and pro-thrombotic state Probable seizure activity noted 6/2 pm P:   RASS goal: -1 Ativan given x 1, keppra ordered 6/2 Sedation per PAD protocol  Hold plavix  for now; no ASA or heparin given severity of her thrombocytopenia EEG and MRI/A brain have been ordered Carotid US   FAMILY  - Updates: Dr. Delton Coombes on 6/2  reviewed clinical status, organ injuries and prognosis with pt's sister and brother at bedside 6/2. Explained that we are  unable at this time to explain all the findings, but that her age and prior CVa's make her prognosis for meaningful survival poor. They want her supported fully until we can determine the severity of her Acute CVA's, the potential for reversibility of her thrombocytopenia and her organ injuries. She will need her airway protected, will intubate, support fully. Will need to revisit status and prognosis depending on her response to support and rx.   - Inter-disciplinary family meet or Palliative Care meeting due by:  04/11/16   Brett Canales Minor ACNP [facility] Pollack PCCM Pager [phone] till 3 pm If no answer page [phone] 04/05/2016, 8:40 AM   Attending note: I have seen and examined the patient with nurse practitioner/resident and agree with the note. History, labs and imaging reviewed.  74 Y/O with acute TTP, thrombocytopenia, CVAs.   We will continue vent support until mental status improved. Started on plasmapherisis. Continues to require levaphed. Wean as tolerated. Trop are starting to come down. Check echo. Follow CBC, platelets 1 unit PRBC for low Hb. Recheck CBC.  Critical care time- 35 mins. This represents my time independent of the NPs time taking care of the patient.  Chilton Greathouse MD [facility] Pager [phone] If no answer or after 3pm call: [phone] 04/05/2016, 2:47 PM

## 2016-04-06 ENCOUNTER — Inpatient Hospital Stay (HOSPITAL_COMMUNITY): Payer: Medicare Other

## 2016-04-06 DIAGNOSIS — R569 Unspecified convulsions: Secondary | ICD-10-CM | POA: Insufficient documentation

## 2016-04-06 DIAGNOSIS — I638 Other cerebral infarction: Secondary | ICD-10-CM

## 2016-04-06 DIAGNOSIS — I639 Cerebral infarction, unspecified: Secondary | ICD-10-CM

## 2016-04-06 LAB — CBC WITH DIFFERENTIAL/PLATELET
BASOS ABS: 0 10*3/uL (ref 0.0–0.1)
BASOS PCT: 0 %
EOS ABS: 0.4 10*3/uL (ref 0.0–0.7)
EOS PCT: 2 %
HCT: 27.1 % — ABNORMAL LOW (ref 36.0–46.0)
Hemoglobin: 9 g/dL — ABNORMAL LOW (ref 12.0–15.0)
Lymphocytes Relative: 11 %
Lymphs Abs: 2.5 10*3/uL (ref 0.7–4.0)
MCH: 28.8 pg (ref 26.0–34.0)
MCHC: 33.2 g/dL (ref 30.0–36.0)
MCV: 86.9 fL (ref 78.0–100.0)
MONO ABS: 2.5 10*3/uL — AB (ref 0.1–1.0)
Monocytes Relative: 11 %
Neutro Abs: 18.3 10*3/uL — ABNORMAL HIGH (ref 1.7–7.7)
Neutrophils Relative %: 76 %
PLATELETS: 123 10*3/uL — AB (ref 150–400)
RBC: 3.12 MIL/uL — ABNORMAL LOW (ref 3.87–5.11)
RDW: 16.6 % — AB (ref 11.5–15.5)
WBC: 23.7 10*3/uL — AB (ref 4.0–10.5)

## 2016-04-06 LAB — BASIC METABOLIC PANEL
ANION GAP: 4 — AB (ref 5–15)
BUN: <5 mg/dL — ABNORMAL LOW (ref 6–20)
CALCIUM: 8.6 mg/dL — AB (ref 8.9–10.3)
CHLORIDE: 107 mmol/L (ref 101–111)
CO2: 30 mmol/L (ref 22–32)
Creatinine, Ser: 1.14 mg/dL — ABNORMAL HIGH (ref 0.44–1.00)
GFR calc non Af Amer: 47 mL/min — ABNORMAL LOW (ref >60)
GFR, EST AFRICAN AMERICAN: 54 mL/min — AB (ref >60)
Glucose, Bld: 137 mg/dL — ABNORMAL HIGH (ref 65–99)
Potassium: 3.8 mmol/L (ref 3.5–5.1)
SODIUM: 141 mmol/L (ref 135–145)

## 2016-04-06 LAB — TROPONIN I: Troponin I: 4.41 ng/mL (ref <0.031)

## 2016-04-06 LAB — COMPREHENSIVE METABOLIC PANEL
ALBUMIN: 2.7 g/dL — AB (ref 3.5–5.0)
ALT: 19 U/L (ref 14–54)
AST: 40 U/L (ref 15–41)
Alkaline Phosphatase: 51 U/L (ref 38–126)
Anion gap: 8 (ref 5–15)
BUN: 8 mg/dL (ref 6–20)
CHLORIDE: 102 mmol/L (ref 101–111)
CO2: 26 mmol/L (ref 22–32)
Calcium: 8 mg/dL — ABNORMAL LOW (ref 8.9–10.3)
Creatinine, Ser: 1.16 mg/dL — ABNORMAL HIGH (ref 0.44–1.00)
GFR calc Af Amer: 53 mL/min — ABNORMAL LOW (ref >60)
GFR, EST NON AFRICAN AMERICAN: 46 mL/min — AB (ref >60)
GLUCOSE: 135 mg/dL — AB (ref 65–99)
POTASSIUM: 2.9 mmol/L — AB (ref 3.5–5.1)
SODIUM: 136 mmol/L (ref 135–145)
TOTAL PROTEIN: 5.8 g/dL — AB (ref 6.5–8.1)
Total Bilirubin: 2.1 mg/dL — ABNORMAL HIGH (ref 0.3–1.2)

## 2016-04-06 LAB — TYPE AND SCREEN
ABO/RH(D): AB POS
Antibody Screen: NEGATIVE
Unit division: 0

## 2016-04-06 LAB — PROTIME-INR
INR: 1.27 (ref 0.00–1.49)
PROTHROMBIN TIME: 16 s — AB (ref 11.6–15.2)

## 2016-04-06 LAB — PHOSPHORUS: Phosphorus: 2.7 mg/dL (ref 2.5–4.6)

## 2016-04-06 LAB — MAGNESIUM
MAGNESIUM: 1.8 mg/dL (ref 1.7–2.4)
Magnesium: 1.3 mg/dL — ABNORMAL LOW (ref 1.7–2.4)

## 2016-04-06 LAB — HAPTOGLOBIN

## 2016-04-06 LAB — APTT: aPTT: 24 seconds (ref 24–37)

## 2016-04-06 MED ORDER — ACD FORMULA A 0.73-2.45-2.2 GM/100ML VI SOLN
Status: AC
  Administered 2016-04-06: 500 mL
  Filled 2016-04-06: qty 500

## 2016-04-06 MED ORDER — ACETAMINOPHEN 325 MG PO TABS
650.0000 mg | ORAL_TABLET | ORAL | Status: DC | PRN
  Administered 2016-04-07 – 2016-04-11 (×2): 650 mg via ORAL
  Filled 2016-04-06 (×2): qty 2

## 2016-04-06 MED ORDER — POTASSIUM CHLORIDE 10 MEQ/100ML IV SOLN
10.0000 meq | INTRAVENOUS | Status: AC
  Administered 2016-04-06 (×2): 10 meq via INTRAVENOUS
  Filled 2016-04-06 (×2): qty 100

## 2016-04-06 MED ORDER — MAGNESIUM SULFATE 2 GM/50ML IV SOLN
2.0000 g | Freq: Once | INTRAVENOUS | Status: AC
  Administered 2016-04-06: 2 g via INTRAVENOUS
  Filled 2016-04-06: qty 50

## 2016-04-06 MED ORDER — AMIODARONE HCL IN DEXTROSE 360-4.14 MG/200ML-% IV SOLN
60.0000 mg/h | INTRAVENOUS | Status: DC
  Administered 2016-04-06: 60 mg/h via INTRAVENOUS
  Filled 2016-04-06 (×2): qty 200

## 2016-04-06 MED ORDER — AMIODARONE HCL IN DEXTROSE 360-4.14 MG/200ML-% IV SOLN
30.0000 mg/h | INTRAVENOUS | Status: DC
  Administered 2016-04-07 – 2016-04-09 (×4): 30 mg/h via INTRAVENOUS
  Filled 2016-04-06 (×5): qty 200

## 2016-04-06 MED ORDER — SODIUM CHLORIDE 0.9 % IV SOLN
4.0000 g | Freq: Once | INTRAVENOUS | Status: AC
  Administered 2016-04-06: 4 g via INTRAVENOUS
  Filled 2016-04-06: qty 40

## 2016-04-06 MED ORDER — ASPIRIN 300 MG RE SUPP
300.0000 mg | Freq: Every day | RECTAL | Status: DC
  Administered 2016-04-06 – 2016-04-07 (×2): 300 mg via RECTAL
  Filled 2016-04-06 (×2): qty 1

## 2016-04-06 MED ORDER — ANTICOAGULANT SODIUM CITRATE 4% (200MG/5ML) IV SOLN
5.0000 mL | Freq: Once | Status: AC
  Administered 2016-04-06: 5 mL
  Filled 2016-04-06: qty 250

## 2016-04-06 MED ORDER — ACD FORMULA A 0.73-2.45-2.2 GM/100ML VI SOLN
500.0000 mL | Status: DC
Start: 2016-04-06 — End: 2016-04-14
  Filled 2016-04-06 (×3): qty 500

## 2016-04-06 MED ORDER — AMIODARONE LOAD VIA INFUSION
150.0000 mg | Freq: Once | INTRAVENOUS | Status: AC
  Administered 2016-04-06: 150 mg via INTRAVENOUS
  Filled 2016-04-06: qty 83.34

## 2016-04-06 MED ORDER — DIPHENHYDRAMINE HCL 25 MG PO CAPS: 25.0000 mg | ORAL_CAPSULE | Freq: Four times a day (QID) | ORAL | Status: DC | PRN

## 2016-04-06 MED ORDER — POTASSIUM CHLORIDE 20 MEQ/15ML (10%) PO SOLN
40.0000 meq | ORAL | Status: AC
  Administered 2016-04-06 (×2): 40 meq
  Filled 2016-04-06 (×2): qty 30

## 2016-04-06 NOTE — Procedures (Signed)
History: Laura Levy is an 75 y.o. female patient with altered mental status and seizures. Routine inpatient EEG was performed for further evaluation.   Patient Active Problem List   Diagnosis Date Noted  . CVA (cerebral infarction) 04/13/2016  . Cerebellar stroke ([place]) 04/08/2016  . Acute respiratory failure ([place])   . 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 Acute cerebrovascular accident (CVA) of cerebellum ([place]) 02/19/2016  . Dizziness and giddiness 02/19/2016  . CKD (chronic kidney disease), stage III 02/19/2016  . Dementia 02/19/2016  . Essential hypertension 02/19/2016  . Hyperlipidemia 02/19/2016  . Vitamin D deficiency 02/19/2016  . Thrombocytopenia ([place]) 02/19/2016  . UTI (lower urinary tract infection) 01/01/2016  . Hypokalemia 01/01/2016  . Weakness 01/01/2016     Current facility-administered medications:  .   stroke: mapping our early stages of recovery book, , Does not apply, Once, Costin Karlyne Greenspan, MD .  acetaminophen (TYLENOL) tablet 650 mg, 650 mg, Oral, Q4H PRN, Chauncey Cruel, MD .  anticoagulant sodium citrate solution 5 mL, 5 mL, Intracatheter, Once, Chauncey Cruel, MD .  antiseptic oral rinse solution (CORINZ), 7 mL, Mouth Rinse, 10 times per day, Raylene Miyamoto, MD, 7 mL at 04/06/16 0601 .  aspirin suppository 300 mg, 300 mg, Rectal, Daily, Latorya Bautch Fuller Mandril, MD .  calcium gluconate 4 g in sodium chloride 0.9 % 250 mL IVPB, 4 g, Intravenous, Once, Chauncey Cruel, MD .  calcium gluconate inj 10% (1 g) URGENT USE ONLY!, 2 g, Intravenous, Once, Heath Lark, MD .  chlorhexidine gluconate (SAGE KIT) (PERIDEX) 0.12 % solution 15 mL, 15 mL, Mouth Rinse, BID, Raylene Miyamoto, MD, 15 mL at 04/05/16 1956 .  citrate dextrose (ACD-A anticoagulant) 0.73-2.45-2.2 GM/100ML solution, , , ,  .  citrate dextrose (ACD-A anticoagulant) solution 500 mL, 500 mL, Intravenous, Continuous, Chauncey Cruel, MD .  diphenhydrAMINE (BENADRYL) capsule  25 mg, 25 mg, Oral, Q6H PRN, Chauncey Cruel, MD .  fentaNYL (SUBLIMAZE) 2,500 mcg in sodium chloride 0.9 % 250 mL (10 mcg/mL) infusion, 25-400 mcg/hr, Intravenous, Continuous, Collene Gobble, MD, Last Rate: 12.5 mL/hr at 04/06/16 0015, 125 mcg/hr at 04/06/16 0015 .  fentaNYL (SUBLIMAZE) bolus via infusion 25 mcg, 25 mcg, Intravenous, Q1H PRN, Collene Gobble, MD .  levETIRAcetam (KEPPRA) 1,000 mg in sodium chloride 0.9 % 100 mL IVPB, 1,000 mg, Intravenous, Q12H, Shan Valdes Fuller Mandril, MD, 1,000 mg at 04/06/16 0600 .  midazolam (VERSED) injection 1 mg, 1 mg, Intravenous, Q15 min PRN, Collene Gobble, MD .  midazolam (VERSED) injection 1 mg, 1 mg, Intravenous, Q2H PRN, Collene Gobble, MD .  norepinephrine (LEVOPHED) 4 mg in dextrose 5 % 250 mL (0.016 mg/mL) infusion, 2-50 mcg/min, Intravenous, Continuous, Flora Lipps, MD, Last Rate: 60 mL/hr at 04/06/16 0602, 16 mcg/min at 04/06/16 0602 .  potassium chloride 20 MEQ/15ML (10%) solution 40 mEq, 40 mEq, Per Tube, Q4H, Brand Males, MD, 40 mEq at 04/06/16 0600 .  senna-docusate (Senokot-S) tablet 1 tablet, 1 tablet, Oral, QHS PRN, Costin Karlyne Greenspan, MD   Introduction:  This is a 19 channel routine scalp EEG performed at the bedside with bipolar and monopolar montages arranged in accordance to the international 10/20 system of electrode placement. One channel was dedicated to EKG recording.   Findings:  Generalized background slowing in the range of 5-6 Hz noted. Suboptimal quality with extensive muscle artifacts. No definite evidence of abnormal epileptiform discharges or electrographic seizures were noted during this  recording.   Impression:  Abnormal routine inpatient EEG suggestive of at least moderate encephalopathy as described. Clinical correlation is recommended .

## 2016-04-06 NOTE — Progress Notes (Signed)
PULMONARY / CRITICAL CARE MEDICINE   Name: Laura Levy MRN: [id number] DOB: 04/14/1941    ADMISSION DATE:  04/27/2016 CONSULTATION DATE:  05/01/2016   REFERRING MD:  Dr Elvera Lennox  CHIEF COMPLAINT:  Altermed MS and thrombocytopenia   HISTORY OF PRESENT ILLNESS:   Hx is obtained from notes and family, pt unable to give hx  75 y.o. female with medical history significant of hypertension, prior strokes, dementia, presented to [facility] emergency room 6/2 with chief complaint of altered mental status, slurred speech beginning the evening prior. CT scan showed subacute versus acute stroke bilateral cerebellum. 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 She transferred to [facility], has experienced progressive decline in wakefulness and airway protection. She was then noted to have rhythmic movements consistent w seizure activity, better after ativan. She moved to ICU and CCM  consulted to assist with her care.      SUBJECTIVE:  Pt unable to speak or give hx.  VITAL SIGNS: BP 108/64 mmHg  Pulse 86  Temp(Src) 98.4 F (36.9 C) (Axillary)  Resp 18  Ht 5\' 3"  (1.6 m)  Wt 149 lb 0.5 oz (67.6 kg)  BMI 26.41 kg/m2  SpO2 100%  HEMODYNAMICS:    VENTILATOR SETTINGS: Vent Mode:  [-] PRVC FiO2 (%):  [40 %] 40 % Set Rate:  [18 bmp] 18 bmp Vt Set:  [420 mL] 420 mL PEEP:  [5 cmH20] 5 cmH20 Plateau Pressure:  [18 cmH20-20 cmH20] 18 cmH20  INTAKE / OUTPUT: I/O last 3 completed shifts: In: 4859.7 [I.V.:2637.7; Blood:842; IV Piggyback:1380] Out: 1425 [Urine:1400; Emesis/NG output:25]  PHYSICAL EXAMINATION: General:  Ill appearing elderly woman, severe kyphosis, currently being pheresis  Neuro:  Some grimace with pain, will not react to voice, no cough, leftward gaze HEENT:  Pooling secretions noted in OP, et to vent Cardiovascular:  Regular, no M Lungs:  Coarse BS B, no wheezes.  Abdomen:  Soft, NT, +  BS Musculoskeletal:  No LE edema Skin:  Some tenting, no rash.   LABS:  BMET  Recent Labs Lab 04/21/2016 0836 04/13/2016 1658 04/06/16 0411  NA 139 140 136  K 3.7 4.2 2.9*  CL 108 108 102  CO2 23 20* 26  BUN 20 14 8   CREATININE 1.36* 1.35* 1.16*  GLUCOSE 120* 83 135*    Electrolytes  Recent Labs Lab 04/06/2016 0836 04/24/2016 1658 04/06/16 0411  CALCIUM 9.1 9.0 8.0*  MG  --   --  1.3*  PHOS  --   --  2.7    CBC  Recent Labs Lab 04/05/16 0524 04/05/16 2004 04/06/16 0411  WBC 20.3* 22.4* 23.7*  HGB 7.0* 8.8* 9.0*  HCT 21.6* 26.2* 27.1*  PLT 128* 109* 123*    Coag's  Recent Labs Lab 04/12/2016 2035 04/06/16 0411  APTT 33 24  INR 1.40 1.27    Sepsis Markers No results for input(s): LATICACIDVEN, PROCALCITON, O2SATVEN in the last 168 hours.  ABG No results for input(s): PHART, PCO2ART, PO2ART in the last 168 hours.  Liver Enzymes  Recent Labs Lab 04/26/2016 0836 04/15/2016 1658 04/06/16 0411  AST 56* 67* 40  ALT 20 23 19   ALKPHOS 53 52 51  BILITOT 3.0* 2.5* 2.1*  ALBUMIN 3.8 3.3* 2.7*    Cardiac Enzymes  Recent Labs Lab 04/22/2016 2035 04/05/16 0100 04/05/16 0524  TROPONINI 3.43* 5.00* 3.90*    Glucose No results for input(s): GLUCAP in the last 168 hours.  Imaging Mr  Mra Head Wo Contrast  04/05/2016  CLINICAL DATA:  Altered mental status. Abnormal balance and slurred speech with associated vomiting. Symptoms progressed prior to admission. Abnormal CT scan. Study was ordered with and without contrast. Patient became hypotensive during this study in the study was therefore terminated. EXAM: MRI HEAD WITHOUT CONTRAST MRA HEAD WITHOUT CONTRAST MRA NECK WITHOUT CONTRAST TECHNIQUE: Multiplanar, multiecho pulse sequences of the brain and surrounding structures were obtained without intravenous contrast. Angiographic images of the Circle of Willis were obtained using MRA technique without intravenous contrast. Angiographic images of the neck were  obtained using MRA technique without intravenous contrast. Carotid stenosis measurements (when applicable) are obtained utilizing NASCET criteria, using the distal internal carotid diameter as the denominator. COMPARISON:  CT head without contrast 04/05/2015. MRI brain 02/16/2016. FINDINGS: MRI HEAD FINDINGS The diffusion-weighted images confirm that the inferior left cerebellar infarct is acute. Extensive T2 signal changes are associated with this new infarction. There is no associated hemorrhage. Two additional areas of acute nonhemorrhagic infarction are present within the central pons. There is some involvement of the left inferior vermis. Extensive punctate cortical acute nonhemorrhagic infarcts are present throughout the right hemisphere in what appears to be a watershed distribution. Multiple areas are present on the left is well, but less extensive. A focal area of cortical acute nonhemorrhagic infarction is noted along the left precentral gyrus. This area may affect the lower extremity. In addition of the areas of acute infarction, there are multiple remote infarcts of the cerebellum bilaterally and a right paramedian pontine infarct. Remote lacunar infarcts are present within the thalami bilaterally. Extensive white matter changes are present within the left external and posterior internal capsule. Encephalomalacia is present in the frontal lobes bilaterally with diffuse associated white matter disease. The internal auditory canals are within normal limits bilaterally. Flow is occluded in the right vertebral artery. The left vertebral artery and basilar artery are patent. The internal carotid arteries are patent bilaterally. Bilateral lens extractions are noted. The paranasal sinuses are clear. Fluid is present in the mastoid air cells bilaterally. No obstructing nasopharyngeal lesion is evident. MRA HEAD FINDINGS Mild tortuosity is present within the right cervical internal carotid artery. The internal  carotid arteries are otherwise within normal limits through the ICA termini bilaterally. There is a fenestration of the distal left A1 segment, a normal variant. No definite anterior communicating artery is present. The MCA bifurcations are intact bilaterally. There is moderate attenuation of distal MCA branch vessels bilaterally, worse on the right. ACA branch vessels are intact. 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 The left PICA origin is visualized and normal. The left vertebral artery is within normal limits. There is moderate stenosis of the mid basilar artery the posterior cerebral arteries are of fetal type bilaterally. There is significant attenuation of distal PCA branch vessels, left greater than right. MRA NECK FINDINGS Time-of-flight imaging through the neck demonstrates no significant flow disturbance at either carotid bifurcation. Flow is antegrade in the vertebral arteries bilaterally. The left vertebral artery is dominant. IMPRESSION: 1. 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 2. Two small acute nonhemorrhagic infarcts within the pons. 3. Multi focal punctate areas of acute nonhemorrhagic infarction over the convexities bilaterally, more prominent on the right. This appears to be in a watershed distribution. It embolic infarcts are considered less likely. 4. More focal acute nonhemorrhagic infarct involving the left precentral gyrus measuring 15 mm along the primary motor cortex. 5. Multiple remote lacunar infarcts of the cerebellum bilaterally.  6. Extensive areas of encephalomalacia involving the frontal lobes bilaterally. 7. 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 8. Mild moderate narrowing in the mid basilar artery. 9. Fetal type posterior cerebral arteries bilaterally. 10. The proximal anterior circulation is intact. 11. Moderate distal small vessel disease in the MCA branch vessels bilaterally, worse on the right. The pattern suggests the possibility  of a vasculitis. 12. No significant flow disturbance at either carotid bifurcation. 13. Flow is antegrade in the vertebral arteries bilaterally. The left vertebral artery is dominant. Electronically Signed   By: Marin Roberts M.D.   On: 04/05/2016 17:30   Mr Angiogram Neck Wo Contrast  04/05/2016  CLINICAL DATA:  Altered mental status. Abnormal balance and slurred speech with associated vomiting. Symptoms progressed prior to admission. Abnormal CT scan. Study was ordered with and without contrast. Patient became hypotensive during this study in the study was therefore terminated. EXAM: MRI HEAD WITHOUT CONTRAST MRA HEAD WITHOUT CONTRAST MRA NECK WITHOUT CONTRAST TECHNIQUE: Multiplanar, multiecho pulse sequences of the brain and surrounding structures were obtained without intravenous contrast. Angiographic images of the Circle of Willis were obtained using MRA technique without intravenous contrast. Angiographic images of the neck were obtained using MRA technique without intravenous contrast. Carotid stenosis measurements (when applicable) are obtained utilizing NASCET criteria, using the distal internal carotid diameter as the denominator. COMPARISON:  CT head without contrast 04/05/2015. MRI brain 02/16/2016. FINDINGS: MRI HEAD FINDINGS The diffusion-weighted images confirm that the inferior left cerebellar infarct is acute. Extensive T2 signal changes are associated with this new infarction. There is no associated hemorrhage. Two additional areas of acute nonhemorrhagic infarction are present within the central pons. There is some involvement of the left inferior vermis. Extensive punctate cortical acute nonhemorrhagic infarcts are present throughout the right hemisphere in what appears to be a watershed distribution. Multiple areas are present on the left is well, but less extensive. A focal area of cortical acute nonhemorrhagic infarction is noted along the left precentral gyrus. This area may affect  the lower extremity. In addition of the areas of acute infarction, there are multiple remote infarcts of the cerebellum bilaterally and a right paramedian pontine infarct. Remote lacunar infarcts are present within the thalami bilaterally. Extensive white matter changes are present within the left external and posterior internal capsule. Encephalomalacia is present in the frontal lobes bilaterally with diffuse associated white matter disease. The internal auditory canals are within normal limits bilaterally. Flow is occluded in the right vertebral artery. The left vertebral artery and basilar artery are patent. The internal carotid arteries are patent bilaterally. Bilateral lens extractions are noted. The paranasal sinuses are clear. Fluid is present in the mastoid air cells bilaterally. No obstructing nasopharyngeal lesion is evident. MRA HEAD FINDINGS Mild tortuosity is present within the right cervical internal carotid artery. The internal carotid arteries are otherwise within normal limits through the ICA termini bilaterally. There is a fenestration of the distal left A1 segment, a normal variant. No definite anterior communicating artery is present. The MCA bifurcations are intact bilaterally. There is moderate attenuation of distal MCA branch vessels bilaterally, worse on the right. ACA branch vessels are intact. 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 The left PICA origin is visualized and normal. The left vertebral artery is within normal limits. There is moderate stenosis of the mid basilar artery the posterior cerebral arteries are of fetal type bilaterally. There is significant attenuation of distal PCA branch vessels, left greater than right. MRA NECK FINDINGS Time-of-flight imaging through the neck  demonstrates no significant flow disturbance at either carotid bifurcation. Flow is antegrade in the vertebral arteries bilaterally. The left vertebral artery is dominant. IMPRESSION: 1. Large confluent acute  nonhemorrhagic infarct within the left inferior cerebellum. 2. Two small acute nonhemorrhagic infarcts within the pons. 3. Multi focal punctate areas of acute nonhemorrhagic infarction over the convexities bilaterally, more prominent on the right. This appears to be in a watershed distribution. It embolic infarcts are considered less likely. 4. More focal acute nonhemorrhagic infarct involving the left precentral gyrus measuring 15 mm along the primary motor cortex. 5. Multiple remote lacunar infarcts of the cerebellum bilaterally. 6. Extensive areas of encephalomalacia involving the frontal lobes bilaterally. 7. 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 8. Mild moderate narrowing in the mid basilar artery. 9. Fetal type posterior cerebral arteries bilaterally. 10. The proximal anterior circulation is intact. 11. Moderate distal small vessel disease in the MCA branch vessels bilaterally, worse on the right. The pattern suggests the possibility of a vasculitis. 12. No significant flow disturbance at either carotid bifurcation. 13. Flow is antegrade in the vertebral arteries bilaterally. The left vertebral artery is dominant. Electronically Signed   By: Marin Roberts M.D.   On: 04/05/2016 17:30   Mr Brain Wo Contrast  04/05/2016  CLINICAL DATA:  Altered mental status. Abnormal balance and slurred speech with associated vomiting. Symptoms progressed prior to admission. Abnormal CT scan. Study was ordered with and without contrast. Patient became hypotensive during this study in the study was therefore terminated. EXAM: MRI HEAD WITHOUT CONTRAST MRA HEAD WITHOUT CONTRAST MRA NECK WITHOUT CONTRAST TECHNIQUE: Multiplanar, multiecho pulse sequences of the brain and surrounding structures were obtained without intravenous contrast. Angiographic images of the Circle of Willis were obtained using MRA technique without intravenous contrast. Angiographic images of the neck were obtained using MRA technique without  intravenous contrast. Carotid stenosis measurements (when applicable) are obtained utilizing NASCET criteria, using the distal internal carotid diameter as the denominator. COMPARISON:  CT head without contrast 04/05/2015. MRI brain 02/16/2016. FINDINGS: MRI HEAD FINDINGS The diffusion-weighted images confirm that the inferior left cerebellar infarct is acute. Extensive T2 signal changes are associated with this new infarction. There is no associated hemorrhage. Two additional areas of acute nonhemorrhagic infarction are present within the central pons. There is some involvement of the left inferior vermis. Extensive punctate cortical acute nonhemorrhagic infarcts are present throughout the right hemisphere in what appears to be a watershed distribution. Multiple areas are present on the left is well, but less extensive. A focal area of cortical acute nonhemorrhagic infarction is noted along the left precentral gyrus. This area may affect the lower extremity. In addition of the areas of acute infarction, there are multiple remote infarcts of the cerebellum bilaterally and a right paramedian pontine infarct. Remote lacunar infarcts are present within the thalami bilaterally. Extensive white matter changes are present within the left external and posterior internal capsule. Encephalomalacia is present in the frontal lobes bilaterally with diffuse associated white matter disease. The internal auditory canals are within normal limits bilaterally. Flow is occluded in the right vertebral artery. The left vertebral artery and basilar artery are patent. The internal carotid arteries are patent bilaterally. Bilateral lens extractions are noted. The paranasal sinuses are clear. Fluid is present in the mastoid air cells bilaterally. No obstructing nasopharyngeal lesion is evident. MRA HEAD FINDINGS Mild tortuosity is present within the right cervical internal carotid artery. The internal carotid arteries are otherwise within  normal limits through the ICA termini bilaterally. There  is a fenestration of the distal left A1 segment, a normal variant. No definite anterior communicating artery is present. The MCA bifurcations are intact bilaterally. There is moderate attenuation of distal MCA branch vessels bilaterally, worse on the right. ACA branch vessels are intact. 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 The left PICA origin is visualized and normal. The left vertebral artery is within normal limits. There is moderate stenosis of the mid basilar artery the posterior cerebral arteries are of fetal type bilaterally. There is significant attenuation of distal PCA branch vessels, left greater than right. MRA NECK FINDINGS Time-of-flight imaging through the neck demonstrates no significant flow disturbance at either carotid bifurcation. Flow is antegrade in the vertebral arteries bilaterally. The left vertebral artery is dominant. IMPRESSION: 1. 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 2. Two small acute nonhemorrhagic infarcts within the pons. 3. Multi focal punctate areas of acute nonhemorrhagic infarction over the convexities bilaterally, more prominent on the right. This appears to be in a watershed distribution. It embolic infarcts are considered less likely. 4. More focal acute nonhemorrhagic infarct involving the left precentral gyrus measuring 15 mm along the primary motor cortex. 5. Multiple remote lacunar infarcts of the cerebellum bilaterally. 6. Extensive areas of encephalomalacia involving the frontal lobes bilaterally. 7. 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 8. Mild moderate narrowing in the mid basilar artery. 9. Fetal type posterior cerebral arteries bilaterally. 10. The proximal anterior circulation is intact. 11. Moderate distal small vessel disease in the MCA branch vessels bilaterally, worse on the right. The pattern suggests the possibility of a vasculitis. 12. No significant  flow disturbance at either carotid bifurcation. 13. Flow is antegrade in the vertebral arteries bilaterally. The left vertebral artery is dominant. Electronically Signed   By: Marin Roberts M.D.   On: 04/05/2016 17:30   Dg Chest Port 1 View  04/06/2016  CLINICAL DATA:  Respiratory failure EXAM: PORTABLE CHEST 1 VIEW COMPARISON:  04/04/2006 FINDINGS: Support devices are stable. Right lung is clear. Left base atelectasis with small left pleural effusion. No acute bony abnormality. IMPRESSION: Left basilar atelectasis with small left effusion. Electronically Signed   By: Charlett Nose M.D.   On: 04/06/2016 07:27     STUDIES:  Head Ct 6/2 >> extensive chronic ischemic changes, new acute / subacute L > R cerebellar infarcts, no mass effect or bleed  CT chest 6/2 >> no dissection, no PE, emphysematous changes CT abdomen/ pelvis 6/2 >> cholelithiasis without evidence obstruction, no other acute findings.  6/3 MRI Infarct left cerebellum, pons infarct, multiple embolic infarcts, rt vertebral artery occlusion      CULTURES: None  ANTIBIOTICS: None  SIGNIFICANT EVENTS: Intubated 6/2 for airway protection 6/3 plasma phoresis  6/3 PRBC transfusion   LINES/TUBES: ETT 6/2 >>  6/2 rt i j hd cath>>  DISCUSSION: 75 yo woman with HTN, mild dementia, hx CVA's admitted 6/2 with acute altered MS. Eval has revealed B cerebellar CVA's, mild acute renal failure, severe thrombocytopenia w schistocytes, NSTEMI. Suspect cerebellar and cardiac ischemia due to microvascular disease and pro-thrombotic state as opposed to macroembolism. Entire picture very concerning for TTP. She has poor IV access, has not had labs drawn successfully. Goal to transfuse plts prior to HD catheter placement if needed. May not be possible if labs (type and screen) can't be obtained.   ASSESSMENT / PLAN:  PULMONARY A: Acute respiratory failure due to inability to protect airway Presumed hx COPD given emphysema on Ct  chest P:  ET intubation now for airway protection  Albuterol prn  CARDIOVASCULAR A:  Elevated troponin I, ? Due to microvascular thrombosis. Peaked at 5 and decreased to 3.9 on 6/3 Hx hypertension P:  ECG Follow serial troponin Hold anti-HTN regimen Consider TTE if troponin continues to rise of if she develops hypotension ? Heparin but platelets were 14 now 128 post plasma phoresis   RENAL Lab Results  Component Value Date   CREATININE 1.16* 04/06/2016   CREATININE 1.35* 04-22-16   CREATININE 1.36* 04-22-2016   CREATININE 1.27 12/01/2013   CREATININE 1.11 08/11/2012    Recent Labs Lab April 22, 2016 0836 Apr 22, 2016 1658 04/06/16 0411  K 3.7 4.2 2.9*     A:   Acute renal failure, note S Cr 1.06 in 02/2016 P:   Follow BMP, electrolytes Place foley to manage I/O's  GASTROINTESTINAL A:   Reported hx H pylori SUP P:   pepcid   HEMATOLOGIC A:    Recent Labs  04/05/16 2004 04/06/16 0411  HGB 8.8* 9.0*    Severe thrombocytopenia, etiology unclear but note episode of thrombocytopenia during her hosp in 3/'17 as well, consider drug induced (amoxicillin/clarithromycin) TTP Mild normocytic anemia P:  Hematology consulted.  Plasma phoresis started  0430 6/3 1st run, Per hematology Plan is another run on 6/4 Transfuse for hgb 7.0  INFECTIOUS A:   No clear source infxn at this time.  P:   Follow clinically, no abx for now Note possible rxn to amoxicillin or clarithromycin   ENDOCRINE A:   Hyperlipidemia    P:   Statin has been on hold for last few days (since her outpt abx were started)  NEUROLOGIC A:   Acute cerebellar CVA's, ? Possible contribution of thrombocytopenia and pro-thrombotic state Probable seizure activity noted 6/2 pm P:   RASS goal: -1 Ativan given x 1, keppra ordered 6/2 Sedation per PAD protocol  Hold plavix for now; no ASA or heparin given severity of her thrombocytopenia EEG and MRI/A brain shows massive injuries, no hope of any  meaningful recovery. Carotid US   FAMILY  - Updates: Dr. Delton Coombes on 6/2  reviewed clinical status, organ injuries and prognosis with pt's sister and brother at bedside 6/2. Explained that we are  unable at this time to explain all the findings, but that her age and prior CVa's make her prognosis for meaningful survival poor. They want her supported fully until we can determine the severity of her Acute CVA's, the potential for reversibility of her thrombocytopenia and her organ injuries. She will need her airway protected, will intubate, support fully. Will need to revisit status and prognosis depending on her response to support and rx. MRI 6/3 demonstrates massive damage with little chance of any recovery.   - Inter-disciplinary family meet or Palliative Care meeting due by:  04/11/16  Brett Canales Minor ACNP [facility]ack PCCM Pager [phone] till 3 pm If no answer page [phone] 04/06/2016, 9:37 AM    Attending note: I have seen and examined the patient with nurse practitioner/resident and agree with the note. History, labs and imaging reviewed.  74 Y/O with acute TTP, thrombocytopenia, CVAs.   Continue vent support until mental status improved. On plasmapherisis. K repleted MRI results noted- With extensive infarcts. I discussed this with her family.  I brought up goals of care but they do not want to consider it now. She remains full code.  Critical care time- 35 mins. This represents my time independent of the NPs time taking care of the patient.  Chilton GreathousePraveen Rutherford Alarie MD [facility] Pager [phone] If no answer or after 3pm call: [phone] 04/06/2016, 3:36 PM

## 2016-04-06 NOTE — Progress Notes (Signed)
SLP Cancellation Note  Patient Details Name: Laura Levy MRN: [id number] DOB: [birth year]   Cancelled treatment:        Intubated. Will follow   [name], Kayliee Atienza Willis 04/06/2016, 10:27 AM [phone]

## 2016-04-06 NOTE — Progress Notes (Signed)
eLink Nursing ICU Electrolyte Replacement Protocol  Patient Name: Laura Levy DOB: 1941/10/22 MRN: [id number]  Date of Service  04/06/2016   HPI/Events of Note    Recent Labs Lab 05/02/2016 0836 04/30/2016 1658 04/06/16 0411  NA 139 140 136  K 3.7 4.2 2.9*  CL 108 108 102  CO2 23 20* 26  GLUCOSE 120* 83 135*  BUN 20 14 8   CREATININE 1.36* 1.35* 1.16*  CALCIUM 9.1 9.0 8.0*  MG  --   --  1.3*  PHOS  --   --  2.7    Estimated Creatinine Clearance: 40.5 mL/min (by C-G formula based on Cr of 1.16).  Intake/Output      06/03 0701 - 06/04 0700   I.V. (mL/kg) 2054 (30.4)   Blood 353   IV Piggyback 110   Total Intake(mL/kg) 2517 (37.2)   Urine (mL/kg/hr) 940 (0.6)   Emesis/NG output 25 (0)   Stool 0 (0)   Total Output 965   Net +1552       Stool Occurrence 1 x    - I/O DETAILED x24h    Total I/O In: 844.6 [I.V.:844.6] Out: 435 [Urine:435] - I/O THIS SHIFT    ASSESSMENT   eICURN Interventions  K+ and Mg replaced using electrolyte protocol   ASSESSMENT: MAJOR ELECTROLYTE    Laura Levy, Laura Levy 04/06/2016, 5:44 AM

## 2016-04-06 NOTE — Progress Notes (Signed)
Speech Language Pathology Discharge Patient Details Name: Laura Levy MRN: [id number] DOB: 11-Jan-1941 Today's Date: 04/06/2016 Time:  -     Patient discharged from SLP services secondary to continues on vent. Please re-order if/when appropriate  Please see latest therapy progress note for current level of functioning and progress toward goals.    Progress and discharge plan discussed with patient and/or caregiver:   GO     [name], [name] 04/06/2016, 5:38 PM

## 2016-04-06 NOTE — Progress Notes (Signed)
STROKE TEAM PROGRESS NOTE   HISTORY OF PRESENT ILLNESS (per record) Laura Levy is an 75 y.o. female patient who was transferred from [facility]. She lives with her sister who was available at bedside to provide history. Patient unable to provide history. Patient apparently started having vomiting yesterday evening, and became more drowsy and altered. She continued to have vomiting this morning and was more obtunded, she was [facility] by her family. A CT of the head done in the ER showed evidence of strokes in bilateral frontoparietal areas as well as involving bilateral cerebellum. [facility] neurological [facility].  Her family reports that patient had prior strokes although we're unable to provide clear history about the chronicity of the strokes are she had a residual symptoms. At baseline, patient apparently had some right-sided weakness, ambulates using a cane, is semi-independent with her ADLs although requires help with shower and needs supervision.    SUBJECTIVE (INTERVAL HISTORY) No family members present. The patient is intubated and essentially unresponsive.   OBJECTIVE Temp:  [97.7 F (36.5 C)-100 F (37.8 C)] 98.1 F (36.7 C) (06/04 1200) Pulse Rate:  [79-102] 79 (06/04 1231) Cardiac Rhythm:  [-] Normal sinus rhythm (06/04 0800) Resp:  [18-20] 18 (06/04 1231) BP: (100-123)/(52-80) 116/57 mmHg (06/04 1231) SpO2:  [100 %] 100 % (06/04 1231) FiO2 (%):  [40 %] 40 % (06/04 1231) Weight:  [67.6 kg (149 lb 0.5 oz)-68.3 kg (150 lb 9.2 oz)] 68.3 kg (150 lb 9.2 oz) (06/04 1120)  CBC:   Recent Labs Lab 04/05/16 0524 04/05/16 2004 04/06/16 0411  WBC 20.3* 22.4* 23.7*  NEUTROABS 15.3*  --  18.3*  HGB 7.0* 8.8* 9.0*  HCT 21.6* 26.2* 27.1*  MCV 88.5 87.0 86.9  PLT 128* 109* 123*    Basic Metabolic Panel:   Recent Labs Lab 04/27/2016 1658 04/06/16 0411  NA 140 136  K 4.2 2.9*   CL 108 102  CO2 20* 26  GLUCOSE 83 135*  BUN 14 8  CREATININE 1.35* 1.16*  CALCIUM 9.0 8.0*  MG  --  1.3*  PHOS  --  2.7    Lipid Panel:     Component Value Date/Time   CHOL 120 04/05/2016 0524   CHOL 116 08/13/2012 0424   TRIG 57 04/05/2016 0524   TRIG 47 08/13/2012 0424   HDL 37* 04/05/2016 0524   HDL 45 08/13/2012 0424   CHOLHDL 3.2 04/05/2016 0524   VLDL 11 04/05/2016 0524   VLDL 9 08/13/2012 0424   LDLCALC 72 04/05/2016 0524   LDLCALC 62 08/13/2012 0424   HgbA1c:  Lab Results  Component Value Date   HGBA1C 5.0 02/16/2016   Urine Drug Screen: No results found for: LABOPIA, COCAINSCRNUR, LABBENZ, AMPHETMU, THCU, LABBARB    IMAGING  Mr Maxine GlennMra Head Wo Contrast 04/05/2016   1. 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  2. Two small acute nonhemorrhagic infarcts within the pons.  3. Multi focal punctate areas of acute nonhemorrhagic infarction over the convexities bilaterally, more prominent on the right. This appears to be in a watershed distribution. Embolic infarcts are considered less likely.  4. More focal acute nonhemorrhagic infarct involving the left precentral gyrus measuring 15 mm along the primary motor cortex.  5. Multiple remote lacunar infarcts of the cerebellum bilaterally.  6. Extensive areas of encephalomalacia involving the frontal lobes bilaterally.  7. 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  8. Mild moderate narrowing in the mid basilar artery.  9. Fetal type posterior cerebral arteries bilaterally.  10. The proximal anterior circulation is intact.  11. Moderate distal small vessel disease in the MCA branch vessels bilaterally, worse on the right. The pattern suggests the possibility of a vasculitis.  12. No significant flow disturbance at either carotid bifurcation.  13. Flow is antegrade in the vertebral arteries bilaterally. The left vertebral artery is dominant.    Dg Chest Port 1 View 04/06/2016   Left  basilar atelectasis with small left effusion.    Dg Chest Port 1 View 04/25/2016   1. Endotracheal tube seen ending 1-2 cm above the carina. This could be retracted 1-2 cm.  2. Right IJ line noted ending about the mid SVC.  3. Lungs hypoexpanded. Vascular congestion noted. Increased interstitial markings may reflect mild interstitial edema.   Dg Chest Port 1 View 04/07/2016   Endotracheal tube 17 mm above the carina. No acute findings.   Transthoracic echocardiogram 04/05/2016 Study Conclusions - Left ventricle: The cavity size was normal. There was mild focal  basal hypertrophy of the septum. Systolic function was normal.  The estimated ejection fraction was in the range of 50% to 55%.  Probable hypokinesis of the apicalanteroseptal, anterior, and  apical myocardium. Doppler parameters are consistent with  abnormal left ventricular relaxation (grade 1 diastolic  dysfunction). - Mitral valve: Calcified annulus. There was mild to moderate  regurgitation. - Tricuspid valve: There was mild-moderate regurgitation directed  centrally. - Pulmonary arteries: Systolic pressure was mildly increased. PA  peak pressure: 34 mm Hg (S).   EEG 04/06/2016 Impression:  Abnormal routine inpatient EEG suggestive of at least moderate encephalopathy as described. Clinical correlation is recommended .     PHYSICAL EXAM Frail cachectic elderly African-American lady not in distress. Intubated. On ventilatory support. . Afebrile. Head is nontraumatic. Neck is supple without bruit.    Cardiac exam no murmur or gallop. Lungs are clear to auscultation. Distal pulses are well felt.  Neurological Exam :  Patient is intubated. Eyes are open. There is upward and slightly left gaze deviation. She she does not blink to threat on either side. Patient does not follow any commands. Pupils irregular 3 mm but reactive. Doll's eye movements are sluggish. Fundi could not be visualized. No visible twitchings of  seizure activity noted. Tongue is midline. Motor system exam reveals spastic right hemiparesis with increased tone with fixed flexion contracture of the fingers and the wrist on the right. Patient has significant paratonia on the left and will resist moments. She does withdraw to pain slightly more on the left compared to the right. Both plantars are upgoing. Gait cannot be tested.    ASSESSMENT/PLAN Ms. Laura Levy is a 75 y.o. female with history of hypertension, previous strokes, and dementia, presenting obtunded. She did not receive IV t-PA due to late presentation.   Strokes:  Bilateral acute and subacute infarcts likely embolic source unknown.   Resultant - intubated and essentially unresponsive.. 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  MRA - Right vertebral artery occlusion. Fetal type posterior cerebral arteries bilaterally. Possible vasculitis.  Carotid Doppler - pending  2D Echo - EF 50-55%. No cardiac source of emboli identified.  EEG - Abnormal routine inpatient EEG suggestive of at least moderate encephalopathy.  LDL - 72  HgbA1c pending  VTE prophylaxis - SCDs Diet NPO time specified  aspirin 81 mg daily and clopidogrel  75 mg daily prior to admission, now on aspirin 300 mg suppository daily  Ongoing aggressive stroke risk factor management  Therapy recommendations: Pending  Disposition: Pending  Hypertension  Blood pressure tends to run somewhat low  Permissive hypertension (OK if < 220/120) but gradually normalize in 5-7 days  Hyperlipidemia  Home meds:  Lipitor 80 mg daily prior to admission - not ordered - NPO  LDL 72, goal < 70  Restart Lipitor when cleared to swallow.  Continue statin at discharge    Other Stroke Risk Factors  Advanced age  Hx stroke/TIA   Other Active Problems  Keppra - 1000 mg every 12 hours.- Status epilepticus suspected at time of admission - see EEG  above.  Anemia - hemoglobin - 9.0; hematocrit - 7.1  Leukocytosis - 23.7 thousand - T max 99.1  Mild thrombocytopenia - 123 thousand platelets.  Hypokalemia - 2.9 - has been supplemented  Plavix allergy listed - potential cause for TTP  Creatinine 1.16  Low calcium ; low magnesium  Ongoing dialysis  PLAN  Consider palliative care consult  Check labs in a.m.   Hospital day # 2  Delton See PA-C [facility] Pager [phone] 04/06/2016, 3:28 PM I have personally examined this patient, reviewed notes, independently viewed imaging studies, participated in medical decision making and plan of care. I have made any additions or clarifications directly to the above note. Agree with note above. 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 Her prognosis for neurological improvement is quite poor and she is unlikely to survive without prolonged ventilatory support, tracheostomy, PEG tube and would likely have to spend prolonged time in a nursing home. Etiology for her strokes is likely a central cardiac source. I will hold off on considering TEE on her pending discussion with family and plan of care Patient's family was not available at the bedside for discussion. I spoke to critical care. Alma Downs and critical care is planning a family meeting soon to discuss about plan of care. This patient is critically ill and at significant risk of neurological worsening, death and care requires constant monitoring of vital signs, hemodynamics,respiratory and cardiac monitoring, extensive review of multiple databases, frequent neurological assessment, discussion with family, other specialists and medical decision making of high complexity.I have made any additions or clarifications directly to the above note.This critical care time does not  reflect procedure time, or teaching time or supervisory time of PA/NP/Med Resident etc but could involve care discussion time.  I spent 30 minutes of neurocritical care time  in the care of  this patient.     Delia Heady, MD Medical Director [facility] St[facility] Pager: [phone] 04/06/2016 4:00 PM     To contact Stroke Continuity provider, please refer to [email or web address]. After hours, contact General Neurology

## 2016-04-06 NOTE — Progress Notes (Signed)
Laura Levy   DOB:1941/03/06   ZO#:[id number]   [id number]#:[id number]  Subjective: patient intubated, not following commands; son in room   Objective:  Filed Vitals:   04/06/16 1114 04/06/16 1120  BP: 103/60 103/63  Pulse: 90 94  Temp: 99 F (37.2 C) 97.7 F (36.5 C)  Resp: 18 18    Body mass index is 26.68 kg/(m^2).  Intake/Output Summary (Last 24 hours) at 04/06/16 1142 Last data filed at 04/06/16 0700  Gross per 24 hour  Intake 2331.51 ml  Output    815 ml  Net 1516.51 ml     CBG (last 3)  No results for input(s): GLUCAP in the last 72 hours.   Labs:  Lab Results  Component Value Date   WBC 23.7* 04/06/2016   HGB 9.0* 04/06/2016   HCT 27.1* 04/06/2016   MCV 86.9 04/06/2016   PLT 123* 04/06/2016   NEUTROABS 18.3* 04/06/2016    @LASTCHEMISTRY @  Urine Studies No results for input(s): UHGB, CRYS in the last 72 hours.  Invalid input(s): UACOL, UAPR, USPG, UPH, UTP, UGL, UKET, UBIL, UNIT, UROB, ULEU, UEPI, UWBC, URBC, UBAC, CAST, [place], [place]  Basic Metabolic Panel:  Recent Labs Lab 04/12/2016 0836 04/26/2016 1658 04/06/16 0411  NA 139 140 136  K 3.7 4.2 2.9*  CL 108 108 102  CO2 23 20* 26  GLUCOSE 120* 83 135*  BUN 20 14 8   CREATININE 1.36* 1.35* 1.16*  CALCIUM 9.1 9.0 8.0*  MG  --   --  1.3*  PHOS  --   --  2.7   GFR Estimated Creatinine Clearance: 40.7 mL/min (by C-G formula based on Cr of 1.16). Liver Function Tests:  Recent Labs Lab 04/19/2016 0836 04/30/2016 1658 04/06/16 0411  AST 56* 67* 40  ALT 20 23 19   ALKPHOS 53 52 51  BILITOT 3.0* 2.5* 2.1*  PROT 7.6 7.4 5.8*  ALBUMIN 3.8 3.3* 2.7*    Recent Labs Lab 05/01/2016 0836  LIPASE 26   No results for input(s): AMMONIA in the last 168 hours. Coagulation profile  Recent Labs Lab 04/26/2016 2035 04/06/16 0411  INR 1.40 1.27    CBC:  Recent Labs Lab 04/21/2016 0836 04/17/2016 2035 04/05/16 0524 04/05/16 2004 04/06/16 0411  WBC 11.0 12.6* 20.3* 22.4* 23.7*  NEUTROABS 7.9* 9.5* 15.3*  --   18.3*  HGB 8.9* 9.6* 7.0* 8.8* 9.0*  HCT 25.8* 29.7* 21.6* 26.2* 27.1*  MCV 86.6 89.2 88.5 87.0 86.9  PLT 13* 14* 128* 109* 123*   Cardiac Enzymes:  Recent Labs Lab 04/18/2016 0836 05/02/2016 2035 04/05/16 0100 04/05/16 0524  TROPONINI 2.16* 3.43* 5.00* 3.90*   BNP: Invalid input(s): POCBNP CBG: No results for input(s): GLUCAP in the last 168 hours. D-Dimer No results for input(s): DDIMER in the last 72 hours. Hgb A1c No results for input(s): HGBA1C in the last 72 hours. Lipid Profile  Recent Labs  04/05/16 0524  CHOL 120  HDL 37*  LDLCALC 72  TRIG 57  CHOLHDL 3.2   Thyroid function studies No results for input(s): TSH, T4TOTAL, T3FREE, THYROIDAB in the last 72 hours.  Invalid input(s): FREET3 Anemia work up No results for input(s): VITAMINB12, FOLATE, FERRITIN, TIBC, IRON, RETICCTPCT in the last 72 hours. Microbiology No results found for this or any previous visit (from the past 240 hour(s)).    Studies:  Mr Shirlee Latch Wo Contrast  04/05/2016  CLINICAL DATA:  Altered mental status. Abnormal balance and slurred speech with associated vomiting. Symptoms progressed prior to admission.  Abnormal CT scan. Study was ordered with and without contrast. Patient became hypotensive during this study in the study was therefore terminated. EXAM: MRI HEAD WITHOUT CONTRAST MRA HEAD WITHOUT CONTRAST MRA NECK WITHOUT CONTRAST TECHNIQUE: Multiplanar, multiecho pulse sequences of the brain and surrounding structures were obtained without intravenous contrast. Angiographic images of the Circle of Willis were obtained using MRA technique without intravenous contrast. Angiographic images of the neck were obtained using MRA technique without intravenous contrast. Carotid stenosis measurements (when applicable) are obtained utilizing NASCET criteria, using the distal internal carotid diameter as the denominator. COMPARISON:  CT head without contrast 04/05/2015. MRI brain 02/16/2016. 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 Extensive T2 signal changes are associated with this new infarction. There is no associated hemorrhage. Two additional areas of acute nonhemorrhagic infarction are present within the central pons. There is some involvement of the left inferior vermis. Extensive punctate cortical acute nonhemorrhagic infarcts are present throughout the right hemisphere in what appears to be a watershed distribution. Multiple areas are present on the left is well, but less extensive. A focal area of cortical acute nonhemorrhagic infarction is noted along the left precentral gyrus. This area may affect the lower extremity. In addition of the areas of acute infarction, there are multiple remote infarcts of the cerebellum bilaterally and a right paramedian pontine infarct. Remote lacunar infarcts are present within the thalami bilaterally. Extensive white matter changes are present within the left external and posterior internal capsule. Encephalomalacia is present in the frontal lobes bilaterally with diffuse associated white matter disease. The internal auditory canals are within normal limits bilaterally. Flow is occluded in the right vertebral artery. The left vertebral artery and basilar artery are patent. The internal carotid arteries are patent bilaterally. Bilateral lens extractions are noted. The paranasal sinuses are clear. Fluid is present in the mastoid air cells bilaterally. No obstructing nasopharyngeal lesion is evident. MRA HEAD FINDINGS Mild tortuosity is present within the right cervical internal carotid artery. The internal carotid arteries are otherwise within normal limits through the ICA termini bilaterally. There is a fenestration of the distal left A1 segment, a normal variant. No definite anterior communicating artery is present. The MCA bifurcations are intact bilaterally. There is moderate attenuation of  distal MCA branch vessels bilaterally, worse on the right. ACA branch vessels are intact. 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 The left PICA origin is visualized and normal. The left vertebral artery is within normal limits. There is moderate stenosis of the mid basilar artery the posterior cerebral arteries are of fetal type bilaterally. There is significant attenuation of distal PCA branch vessels, left greater than right. MRA NECK FINDINGS Time-of-flight imaging through the neck demonstrates no significant flow disturbance at either carotid bifurcation. Flow is antegrade in the vertebral arteries bilaterally. The left vertebral artery is dominant. IMPRESSION: 1. 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 2. Two small acute nonhemorrhagic infarcts within the pons. 3. Multi focal punctate areas of acute nonhemorrhagic infarction over the convexities bilaterally, more prominent on the right. This appears to be in a watershed distribution. It embolic infarcts are considered less likely. 4. More focal acute nonhemorrhagic infarct involving the left precentral gyrus measuring 15 mm along the primary motor cortex. 5. Multiple remote lacunar infarcts of the cerebellum bilaterally. 6. Extensive areas of encephalomalacia involving the frontal lobes bilaterally. 7. 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 8. Mild moderate narrowing in the mid basilar artery.  9. Fetal type posterior cerebral arteries bilaterally. 10. The proximal anterior circulation is intact. 11. Moderate distal small vessel disease in the MCA branch vessels bilaterally, worse on the right. The pattern suggests the possibility of a vasculitis. 12. No significant flow disturbance at either carotid bifurcation. 13. Flow is antegrade in the vertebral arteries bilaterally. The left vertebral artery is dominant. Electronically Signed   By: Marin Roberts M.D.   On: 04/05/2016 17:30   Mr Angiogram Neck Wo  Contrast  04/05/2016  CLINICAL DATA:  Altered mental status. Abnormal balance and slurred speech with associated vomiting. Symptoms progressed prior to admission. Abnormal CT scan. Study was ordered with and without contrast. Patient became hypotensive during this study in the study was therefore terminated. EXAM: MRI HEAD WITHOUT CONTRAST MRA HEAD WITHOUT CONTRAST MRA NECK WITHOUT CONTRAST TECHNIQUE: Multiplanar, multiecho pulse sequences of the brain and surrounding structures were obtained without intravenous contrast. Angiographic images of the Circle of Willis were obtained using MRA technique without intravenous contrast. Angiographic images of the neck were obtained using MRA technique without intravenous contrast. Carotid stenosis measurements (when applicable) are obtained utilizing NASCET criteria, using the distal internal carotid diameter as the denominator. COMPARISON:  CT head without contrast 04/05/2015. MRI brain 02/16/2016. FINDINGS: MRI HEAD FINDINGS The diffusion-weighted images confirm that the inferior left cerebellar infarct is acute. Extensive T2 signal changes are associated with this new infarction. There is no associated hemorrhage. Two additional areas of acute nonhemorrhagic infarction are present within the central pons. There is some involvement of the left inferior vermis. Extensive punctate cortical acute nonhemorrhagic infarcts are present throughout the right hemisphere in what appears to be a watershed distribution. Multiple areas are present on the left is well, but less extensive. A focal area of cortical acute nonhemorrhagic infarction is noted along the left precentral gyrus. This area may affect the lower extremity. In addition of the areas of acute infarction, there are multiple remote infarcts of the cerebellum bilaterally and a right paramedian pontine infarct. Remote lacunar infarcts are present within the thalami bilaterally. Extensive white matter changes are present  within the left external and posterior internal capsule. Encephalomalacia is present in the frontal lobes bilaterally with diffuse associated white matter disease. The internal auditory canals are within normal limits bilaterally. Flow is occluded in the right vertebral artery. The left vertebral artery and basilar artery are patent. The internal carotid arteries are patent bilaterally. Bilateral lens extractions are noted. The paranasal sinuses are clear. Fluid is present in the mastoid air cells bilaterally. No obstructing nasopharyngeal lesion is evident. MRA HEAD FINDINGS Mild tortuosity is present within the right cervical internal carotid artery. The internal carotid arteries are otherwise within normal limits through the ICA termini bilaterally. There is a fenestration of the distal left A1 segment, a normal variant. No definite anterior communicating artery is present. The MCA bifurcations are intact bilaterally. There is moderate attenuation of distal MCA branch vessels bilaterally, worse on the right. ACA branch vessels are intact. 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 The left PICA origin is visualized and normal. The left vertebral artery is within normal limits. There is moderate stenosis of the mid basilar artery the posterior cerebral arteries are of fetal type bilaterally. There is significant attenuation of distal PCA branch vessels, left greater than right. MRA NECK FINDINGS Time-of-flight imaging through the neck demonstrates no significant flow disturbance at either carotid bifurcation. Flow is antegrade in the vertebral arteries bilaterally. The left vertebral artery is dominant. IMPRESSION: 1. Large  confluent acute nonhemorrhagic infarct within the left inferior cerebellum. 2. Two small acute nonhemorrhagic infarcts within the pons. 3. Multi focal punctate areas of acute nonhemorrhagic infarction over the convexities bilaterally, more prominent on the right. This appears to be in a watershed  distribution. It embolic infarcts are considered less likely. 4. More focal acute nonhemorrhagic infarct involving the left precentral gyrus measuring 15 mm along the primary motor cortex. 5. Multiple remote lacunar infarcts of the cerebellum bilaterally. 6. Extensive areas of encephalomalacia involving the frontal lobes bilaterally. 7. 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 8. Mild moderate narrowing in the mid basilar artery. 9. Fetal type posterior cerebral arteries bilaterally. 10. The proximal anterior circulation is intact. 11. Moderate distal small vessel disease in the MCA branch vessels bilaterally, worse on the right. The pattern suggests the possibility of a vasculitis. 12. No significant flow disturbance at either carotid bifurcation. 13. Flow is antegrade in the vertebral arteries bilaterally. The left vertebral artery is dominant. Electronically Signed   By: Marin Roberts M.D.   On: 04/05/2016 17:30   Mr Brain Wo Contrast  04/05/2016  CLINICAL DATA:  Altered mental status. Abnormal balance and slurred speech with associated vomiting. Symptoms progressed prior to admission. Abnormal CT scan. Study was ordered with and without contrast. Patient became hypotensive during this study in the study was therefore terminated. EXAM: MRI HEAD WITHOUT CONTRAST MRA HEAD WITHOUT CONTRAST MRA NECK WITHOUT CONTRAST TECHNIQUE: Multiplanar, multiecho pulse sequences of the brain and surrounding structures were obtained without intravenous contrast. Angiographic images of the Circle of Willis were obtained using MRA technique without intravenous contrast. Angiographic images of the neck were obtained using MRA technique without intravenous contrast. Carotid stenosis measurements (when applicable) are obtained utilizing NASCET criteria, using the distal internal carotid diameter as the denominator. COMPARISON:  CT head without contrast 04/05/2015. MRI brain 02/16/2016. FINDINGS: MRI HEAD FINDINGS The  diffusion-weighted images confirm that the inferior left cerebellar infarct is acute. Extensive T2 signal changes are associated with this new infarction. There is no associated hemorrhage. Two additional areas of acute nonhemorrhagic infarction are present within the central pons. There is some involvement of the left inferior vermis. Extensive punctate cortical acute nonhemorrhagic infarcts are present throughout the right hemisphere in what appears to be a watershed distribution. Multiple areas are present on the left is well, but less extensive. A focal area of cortical acute nonhemorrhagic infarction is noted along the left precentral gyrus. This area may affect the lower extremity. In addition of the areas of acute infarction, there are multiple remote infarcts of the cerebellum bilaterally and a right paramedian pontine infarct. Remote lacunar infarcts are present within the thalami bilaterally. Extensive white matter changes are present within the left external and posterior internal capsule. Encephalomalacia is present in the frontal lobes bilaterally with diffuse associated white matter disease. The internal auditory canals are within normal limits bilaterally. Flow is occluded in the right vertebral artery. The left vertebral artery and basilar artery are patent. The internal carotid arteries are patent bilaterally. Bilateral lens extractions are noted. The paranasal sinuses are clear. Fluid is present in the mastoid air cells bilaterally. No obstructing nasopharyngeal lesion is evident. MRA HEAD FINDINGS Mild tortuosity is present within the right cervical internal carotid artery. The internal carotid arteries are otherwise within normal limits through the ICA termini bilaterally. There is a fenestration of the distal left A1 segment, a normal variant. No definite anterior communicating artery is present. The MCA bifurcations are intact bilaterally. There is  moderate attenuation of distal MCA branch  vessels bilaterally, worse on the right. ACA branch vessels are intact. 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 The left PICA origin is visualized and normal. The left vertebral artery is within normal limits. There is moderate stenosis of the mid basilar artery the posterior cerebral arteries are of fetal type bilaterally. There is significant attenuation of distal PCA branch vessels, left greater than right. MRA NECK FINDINGS Time-of-flight imaging through the neck demonstrates no significant flow disturbance at either carotid bifurcation. Flow is antegrade in the vertebral arteries bilaterally. The left vertebral artery is dominant. IMPRESSION: 1. 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 2. Two small acute nonhemorrhagic infarcts within the pons. 3. Multi focal punctate areas of acute nonhemorrhagic infarction over the convexities bilaterally, more prominent on the right. This appears to be in a watershed distribution. It embolic infarcts are considered less likely. 4. More focal acute nonhemorrhagic infarct involving the left precentral gyrus measuring 15 mm along the primary motor cortex. 5. Multiple remote lacunar infarcts of the cerebellum bilaterally. 6. Extensive areas of encephalomalacia involving the frontal lobes bilaterally. 7. 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 8. Mild moderate narrowing in the mid basilar artery. 9. Fetal type posterior cerebral arteries bilaterally. 10. The proximal anterior circulation is intact. 11. Moderate distal small vessel disease in the MCA branch vessels bilaterally, worse on the right. The pattern suggests the possibility of a vasculitis. 12. No significant flow disturbance at either carotid bifurcation. 13. Flow is antegrade in the vertebral arteries bilaterally. The left vertebral artery is dominant. Electronically Signed   By: Marin Roberts M.D.   On: 04/05/2016 17:30   Dg Chest Port 1 View  04/06/2016  CLINICAL  DATA:  Respiratory failure EXAM: PORTABLE CHEST 1 VIEW COMPARISON:  04/04/2006 FINDINGS: Support devices are stable. Right lung is clear. Left base atelectasis with small left pleural effusion. No acute bony abnormality. IMPRESSION: Left basilar atelectasis with small left effusion. Electronically Signed   By: Charlett Nose M.D.   On: 04/06/2016 07:27   Dg Chest Port 1 View  2016/04/05  CLINICAL DATA:  Central line placement.  Initial encounter. EXAM: PORTABLE CHEST 1 VIEW COMPARISON:  Chest radiograph performed earlier today at 8:56 p.m. FINDINGS: The patient's endotracheal tube is seen ending 1-2 cm above the carina. This could be retracted 1-2 cm. The right IJ line is noted ending about the mid SVC. The lungs are hypoexpanded. Vascular congestion is noted. Increased interstitial markings may reflect mild interstitial edema. No pleural effusion or pneumothorax is seen. The cardiomediastinal silhouette is borderline normal in size. No acute osseous abnormalities are identified. IMPRESSION: 1. Endotracheal tube seen ending 1-2 cm above the carina. This could be retracted 1-2 cm. 2. Right IJ line noted ending about the mid SVC. 3. Lungs hypoexpanded. Vascular congestion noted. Increased interstitial markings may reflect mild interstitial edema. Electronically Signed   By: Roanna Raider M.D.   On: 05-Apr-2016 23:03   Dg Chest Port 1 View  04/05/16  CLINICAL DATA:  Acute respiratory failure EXAM: PORTABLE CHEST 1 VIEW COMPARISON:  04/05/16 FINDINGS: Endotracheal tube is 17 mm above the carina. NG tube enters the stomach. Heart is normal size. No confluent airspace opacities or effusions. No acute bony abnormality. IMPRESSION: Endotracheal tube 17 mm above the carina. No acute findings. Electronically Signed   By: Charlett Nose M.D.   On: April 05, 2016 21:09    Assessment/Plan : 75 y.o. [place] woman with TTP responding to plasmapheresis  The patient completed her 2d plasmapheresis today. 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 Neurology evaluation in process.  Per RN, critical care is planning a family conference to discuss findings. Dr Bertis RuddyGorsuch will be available tomorrow. From a narrow hematologic point of view the TTP is reversible and is responding.  Plan for repeat plasmapheresis in AM.  Please let us know if we can be of further help at this point.   Lowella DellMAGRINAT,Audrey Eller C, MD 04/06/2016  11:42 AM Medical Oncology and Hematology [facility] [address] [place], [place] [address] Tel. [phone]    Fax. [phone]

## 2016-04-06 NOTE — Progress Notes (Signed)
eLink Physician-Brief Progress Note Patient Name: Laura Levy Kathol DOB: Nov 17, 1940 MRN: [id number]   Date of Service  04/06/2016  HPI/Events of Note  AFIB/AFlutter - Ventricular rate = 130. Currently on a Norepinephrine IV infusion. Last K+ at 8 PM >> 3.8.  eICU Interventions  Will order: 1. Amiodarone IV load and infusion.  2. Replete K+. 3. Magnesium Level now. 4. Cycle Troponin.     Intervention Category Major Interventions: Arrhythmia - evaluation and management  Sommer,Steven Eugene 04/06/2016, 9:27 PM

## 2016-04-06 NOTE — Progress Notes (Signed)
Interval History:                                                                                                                      Laura Levy is an 75 y.o. female patient transferred from anesthesia hospital with altered mental status as described in my consultation note. she was noted to have clinical seizures with left gaze deviation and facial and tongue twitching, resolved with Ativan followed by Keppra maintenance dose. She was intubated overnight, transferred to the ICU for further care. No further clinical events suggestive of seizures overnight.   EEG  was done this morning,  MRI of the brain, MRA of the head and neck were all ordered, pending   t Medical History: Past Medical History  Diagnosis Date  . Hypertension   . Stroke ([place])   . Dementia   . Edema leg   . H. pylori infection     Past Surgical History  Procedure Laterality Date  . Cataract extraction w/phaco Right 03/27/2015    Procedure: CATARACT EXTRACTION PHACO AND INTRAOCULAR LENS PLACEMENT (IOC);  Surgeon: Birder Robson, MD;  Location: [facility] ORS;  Service: Ophthalmology;  Laterality: Right;  [place] 01:00 AP%28.9 CDE 17.42  . Cataract extraction w/phaco Left 05/29/2015    Procedure: CATARACT EXTRACTION PHACO AND INTRAOCULAR LENS PLACEMENT (IOC);  Surgeon: Birder Robson, MD;  Location: [facility] ORS;  Service: Ophthalmology;  Laterality: Left;  cassette lot# [id number]    [place] 00:27.7  AP 22.3 CDE  6.17  . Rotator cuff repair      Family History: Family History  Problem Relation Age of Onset  . Congestive Heart Failure Mother     Social History:   reports that she has quit smoking. She does not have any smokeless tobacco history on file. She reports that she does not drink alcohol. Her drug history is not on file.  Allergies:  Allergies  Allergen Reactions  . Plavix [Clopidogrel Bisulfate] Other (See Comments)    Potential cause for TTP     Medications:                                                                                                                          Current facility-administered medications:  .   stroke: mapping our early stages of recovery book, , Does not apply, Once, Costin Karlyne Greenspan, MD .  acetaminophen (TYLENOL) tablet 650 mg, 650 mg, Oral, Q4H PRN, Chauncey Cruel, MD .  anticoagulant sodium citrate  solution 5 mL, 5 mL, Intracatheter, Once, Chauncey Cruel, MD .  antiseptic oral rinse solution (CORINZ), 7 mL, Mouth Rinse, 10 times per day, Raylene Miyamoto, MD, 7 mL at 04/06/16 0601 .  calcium gluconate 4 g in sodium chloride 0.9 % 250 mL IVPB, 4 g, Intravenous, Once, Chauncey Cruel, MD .  calcium gluconate inj 10% (1 g) URGENT USE ONLY!, 2 g, Intravenous, Once, Heath Lark, MD .  chlorhexidine gluconate (SAGE KIT) (PERIDEX) 0.12 % solution 15 mL, 15 mL, Mouth Rinse, BID, Raylene Miyamoto, MD, 15 mL at 04/05/16 1956 .  citrate dextrose (ACD-A anticoagulant) 0.73-2.45-2.2 GM/100ML solution, , , ,  .  citrate dextrose (ACD-A anticoagulant) solution 500 mL, 500 mL, Intravenous, Continuous, Chauncey Cruel, MD .  diphenhydrAMINE (BENADRYL) capsule 25 mg, 25 mg, Oral, Q6H PRN, Chauncey Cruel, MD .  fentaNYL (SUBLIMAZE) 2,500 mcg in sodium chloride 0.9 % 250 mL (10 mcg/mL) infusion, 25-400 mcg/hr, Intravenous, Continuous, Collene Gobble, MD, Last Rate: 12.5 mL/hr at 04/06/16 0015, 125 mcg/hr at 04/06/16 0015 .  fentaNYL (SUBLIMAZE) bolus via infusion 25 mcg, 25 mcg, Intravenous, Q1H PRN, Collene Gobble, MD .  levETIRAcetam (KEPPRA) 1,000 mg in sodium chloride 0.9 % 100 mL IVPB, 1,000 mg, Intravenous, Q12H, Trygve Thal Fuller Mandril, MD, 1,000 mg at 04/06/16 0600 .  midazolam (VERSED) injection 1 mg, 1 mg, Intravenous, Q15 min PRN, Collene Gobble, MD .  midazolam (VERSED) injection 1 mg, 1 mg, Intravenous, Q2H PRN, Collene Gobble, MD .  norepinephrine (LEVOPHED) 4 mg in dextrose 5 % 250 mL (0.016 mg/mL) infusion, 2-50 mcg/min, Intravenous, Continuous, Flora Lipps,  MD, Last Rate: 60 mL/hr at 04/06/16 0602, 16 mcg/min at 04/06/16 0602 .  potassium chloride 20 MEQ/15ML (10%) solution 40 mEq, 40 mEq, Per Tube, Q4H, Brand Males, MD, 40 mEq at 04/06/16 0600 .  senna-docusate (Senokot-S) tablet 1 tablet, 1 tablet, Oral, QHS PRN, Caren Griffins, MD   Neurologic Examination:                                                                                                     Today's Vitals   04/06/16 0408 04/06/16 0500 04/06/16 0600 04/06/16 0700  BP:  119/68 115/69 105/64  Pulse:  84 85 85  Temp:      TempSrc:      Resp:  _0 Height:      Weight: 67.6 kg (149 lb 0.5 oz)     SpO2:  100% 100% 100%    Patient intubated, sedated. No abnormal facial twitching noted no gaze deviation or nystagmus. Minimal withdrawal response to stem was in the limbs    Lab Results: Basic Metabolic Panel:  Recent Labs Lab 04/18/2016 0836 04/24/2016 1658 04/06/16 0411  NA 139 140 136  K 3.7 4.2 2.9*  CL 108 108 102  CO2 23 20* 26  GLUCOSE 120* 83 135*  BUN _1 CREATININE 1.36* 1.35* 1.16*  CALCIUM 9.1 9.0 8.0*  MG  --   --  1.3*  PHOS  --   --  2.7    Liver Function Tests:  Recent Labs Lab 04/21/2016 0836 04/10/2016 1658 04/06/16 0411  AST 56* 67* 40  ALT _0 ALKPHOS 53 52 51  BILITOT 3.0* 2.5* 2.1*  PROT 7.6 7.4 5.8*  ALBUMIN 3.8 3.3* 2.7*    Recent Labs Lab 04/28/2016 0836  LIPASE 26   No results for input(s): AMMONIA in the last 168 hours.  CBC:  Recent Labs Lab 04/23/2016 0836 05/01/2016 2035 04/05/16 0524 04/05/16 2004 04/06/16 0411  WBC 11.0 12.6* 20.3* 22.4* 23.7*  NEUTROABS 7.9* 9.5* 15.3*  --  18.3*  HGB 8.9* 9.6* 7.0* 8.8* 9.0*  HCT 25.8* 29.7* 21.6* 26.2* 27.1*  MCV 86.6 89.2 88.5 87.0 86.9  PLT 13* 14* 128* 109* 123*    Cardiac Enzymes:  Recent Labs Lab 04/23/2016 0836 04/19/2016 2035 04/05/16 0100 04/05/16 0524  TROPONINI 2.16* 3.43* 5.00* 3.90*    Lipid Panel:  Recent Labs Lab 04/05/16 0524   CHOL 120  TRIG 57  HDL 37*  CHOLHDL 3.2  VLDL 11  LDLCALC 72    CBG: No results for input(s): GLUCAP in the last 168 hours.  Microbiology: Results for orders placed or performed during the hospital encounter of 01/01/16  Urine culture     Status: None   Collection Time: 01/01/16 10:51 AM  Result Value Ref Range Status   Specimen Description URINE, CLEAN CATCH  Final   Special Requests Normal  Final   Culture NO GROWTH 1 DAY  Final   Report Status 01/03/2016 FINAL  Final    Imaging: Ct Head Wo Contrast  04/07/2016  CLINICAL DATA:  Vomiting beginning last night which is persistent. Weakness. History of old strokes. EXAM: CT HEAD WITHOUT CONTRAST TECHNIQUE: Contiguous axial images were obtained from the base of the skull through the vertex without intravenous contrast. COMPARISON:  01/01/2016.  02/16/2016. FINDINGS: New infarctions are seen within the inferior cerebellum, left more than right. There is mild swelling. No hemorrhage. Old cerebellar infarctions are present. Old pontine infarction. Old right occipital infarction. Old left frontoparietal infarction. Old right posterior frontal infarction. Chronic small-vessel disease within the deep white matter. No mass lesion, hydrocephalus or extra-axial collection. No calvarial abnormality. No fluid in the sinuses. IMPRESSION: Extensive old ischemic changes throughout the brain. New/subacute infarction at the inferior cerebellum left more than right. No mass effect or hemorrhage. Electronically Signed   By: Nelson Chimes M.D.   On: 04/03/2016 09:30   Mr Jodene Nam Head Wo Contrast  04/05/2016  CLINICAL DATA:  Altered mental status. Abnormal balance and slurred speech with associated vomiting. Symptoms progressed prior to admission. Abnormal CT scan. Study was ordered with and without contrast. Patient became hypotensive during this study in the study was therefore terminated. EXAM: MRI HEAD WITHOUT CONTRAST MRA HEAD WITHOUT CONTRAST MRA NECK WITHOUT  CONTRAST TECHNIQUE: Multiplanar, multiecho pulse sequences of the brain and surrounding structures were obtained without intravenous contrast. Angiographic images of the Circle of Willis were obtained using MRA technique without intravenous contrast. Angiographic images of the neck were obtained using MRA technique without intravenous contrast. Carotid stenosis measurements (when applicable) are obtained utilizing NASCET criteria, using the distal internal carotid diameter as the denominator. COMPARISON:  CT head without contrast 04/05/2015. MRI brain 02/16/2016. FINDINGS: MRI HEAD FINDINGS The diffusion-weighted images confirm that the inferior left cerebellar infarct is acute. Extensive T2 signal changes are associated with this new infarction. There is no associated hemorrhage. Two additional areas of acute nonhemorrhagic infarction are present within the  central pons. There is some involvement of the left inferior vermis. Extensive punctate cortical acute nonhemorrhagic infarcts are present throughout the right hemisphere in what appears to be a watershed distribution. Multiple areas are present on the left is well, but less extensive. A focal area of cortical acute nonhemorrhagic infarction is noted along the left precentral gyrus. This area may affect the lower extremity. In addition of the areas of acute infarction, there are multiple remote infarcts of the cerebellum bilaterally and a right paramedian pontine infarct. Remote lacunar infarcts are present within the thalami bilaterally. Extensive white matter changes are present within the left external and posterior internal capsule. Encephalomalacia is present in the frontal lobes bilaterally with diffuse associated white matter disease. The internal auditory canals are within normal limits bilaterally. Flow is occluded in the right vertebral artery. The left vertebral artery and basilar artery are patent. The internal carotid arteries are patent  bilaterally. Bilateral lens extractions are noted. The paranasal sinuses are clear. Fluid is present in the mastoid air cells bilaterally. No obstructing nasopharyngeal lesion is evident. MRA HEAD FINDINGS Mild tortuosity is present within the right cervical internal carotid artery. The internal carotid arteries are otherwise within normal limits through the ICA termini bilaterally. There is a fenestration of the distal left A1 segment, a normal variant. No definite anterior communicating artery is present. The MCA bifurcations are intact bilaterally. There is moderate attenuation of distal MCA branch vessels bilaterally, worse on the right. ACA branch vessels are intact. 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 The left PICA origin is visualized and normal. The left vertebral artery is within normal limits. There is moderate stenosis of the mid basilar artery the posterior cerebral arteries are of fetal type bilaterally. There is significant attenuation of distal PCA branch vessels, left greater than right. MRA NECK FINDINGS Time-of-flight imaging through the neck demonstrates no significant flow disturbance at either carotid bifurcation. Flow is antegrade in the vertebral arteries bilaterally. The left vertebral artery is dominant. IMPRESSION: 1. 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 2. Two small acute nonhemorrhagic infarcts within the pons. 3. Multi focal punctate areas of acute nonhemorrhagic infarction over the convexities bilaterally, more prominent on the right. This appears to be in a watershed distribution. It embolic infarcts are considered less likely. 4. More focal acute nonhemorrhagic infarct involving the left precentral gyrus measuring 15 mm along the primary motor cortex. 5. Multiple remote lacunar infarcts of the cerebellum bilaterally. 6. Extensive areas of encephalomalacia involving the frontal lobes bilaterally. 7. The right vertebral artery is  occluded. 8. Mild moderate narrowing in the mid basilar artery. 9. Fetal type posterior cerebral arteries bilaterally. 10. The proximal anterior circulation is intact. 11. Moderate distal small vessel disease in the MCA branch vessels bilaterally, worse on the right. The pattern suggests the possibility of a vasculitis. 12. No significant flow disturbance at either carotid bifurcation. 13. Flow is antegrade in the vertebral arteries bilaterally. The left vertebral artery is dominant. Electronically Signed   By: San Morelle M.D.   On: 04/05/2016 17:30   Mr Angiogram Neck Wo Contrast  04/05/2016  CLINICAL DATA:  Altered mental status. Abnormal balance and slurred speech with associated vomiting. Symptoms progressed prior to admission. Abnormal CT scan. Study was ordered with and without contrast. Patient became hypotensive during this study in the study was therefore terminated. EXAM: MRI HEAD WITHOUT CONTRAST MRA HEAD WITHOUT CONTRAST MRA NECK WITHOUT CONTRAST TECHNIQUE: Multiplanar, multiecho pulse sequences of the brain and surrounding  structures were obtained without intravenous contrast. Angiographic images of the Circle of Willis were obtained using MRA technique without intravenous contrast. Angiographic images of the neck were obtained using MRA technique without intravenous contrast. Carotid stenosis measurements (when applicable) are obtained utilizing NASCET criteria, using the distal internal carotid diameter as the denominator. COMPARISON:  CT head without contrast 04/05/2015. MRI brain 02/16/2016. FINDINGS: MRI HEAD FINDINGS The diffusion-weighted images confirm that the inferior left cerebellar infarct is acute. Extensive T2 signal changes are associated with this new infarction. There is no associated hemorrhage. Two additional areas of acute nonhemorrhagic infarction are present within the central pons. There is some involvement of the left inferior vermis. Extensive punctate cortical acute  nonhemorrhagic infarcts are present throughout the right hemisphere in what appears to be a watershed distribution. Multiple areas are present on the left is well, but less extensive. A focal area of cortical acute nonhemorrhagic infarction is noted along the left precentral gyrus. This area may affect the lower extremity. In addition of the areas of acute infarction, there are multiple remote infarcts of the cerebellum bilaterally and a right paramedian pontine infarct. Remote lacunar infarcts are present within the thalami bilaterally. Extensive white matter changes are present within the left external and posterior internal capsule. Encephalomalacia is present in the frontal lobes bilaterally with diffuse associated white matter disease. The internal auditory canals are within normal limits bilaterally. Flow is occluded in the right vertebral artery. The left vertebral artery and basilar artery are patent. The internal carotid arteries are patent bilaterally. Bilateral lens extractions are noted. The paranasal sinuses are clear. Fluid is present in the mastoid air cells bilaterally. No obstructing nasopharyngeal lesion is evident. MRA HEAD FINDINGS Mild tortuosity is present within the right cervical internal carotid artery. The internal carotid arteries are otherwise within normal limits through the ICA termini bilaterally. There is a fenestration of the distal left A1 segment, a normal variant. No definite anterior communicating artery is present. The MCA bifurcations are intact bilaterally. There is moderate attenuation of distal MCA branch vessels bilaterally, worse on the right. ACA branch vessels are intact. 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 The left PICA origin is visualized and normal. The left vertebral artery is within normal limits. There is moderate stenosis of the mid basilar artery the posterior cerebral arteries are of fetal type bilaterally. There is significant attenuation of distal PCA  branch vessels, left greater than right. MRA NECK FINDINGS Time-of-flight imaging through the neck demonstrates no significant flow disturbance at either carotid bifurcation. Flow is antegrade in the vertebral arteries bilaterally. The left vertebral artery is dominant. IMPRESSION: 1. 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 2. Two small acute nonhemorrhagic infarcts within the pons. 3. Multi focal punctate areas of acute nonhemorrhagic infarction over the convexities bilaterally, more prominent on the right. This appears to be in a watershed distribution. It embolic infarcts are considered less likely. 4. More focal acute nonhemorrhagic infarct involving the left precentral gyrus measuring 15 mm along the primary motor cortex. 5. Multiple remote lacunar infarcts of the cerebellum bilaterally. 6. Extensive areas of encephalomalacia involving the frontal lobes bilaterally. 7. 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 8. Mild moderate narrowing in the mid basilar artery. 9. Fetal type posterior cerebral arteries bilaterally. 10. The proximal anterior circulation is intact. 11. Moderate distal small vessel disease in the MCA branch vessels bilaterally, worse on the right. The pattern suggests the possibility of a vasculitis. 12. No significant flow disturbance at either  carotid bifurcation. 13. Flow is antegrade in the vertebral arteries bilaterally. The left vertebral artery is dominant. Electronically Signed   By: San Morelle M.D.   On: 04/05/2016 17:30   Mr Brain Wo Contrast  04/05/2016  CLINICAL DATA:  Altered mental status. Abnormal balance and slurred speech with associated vomiting. Symptoms progressed prior to admission. Abnormal CT scan. Study was ordered with and without contrast. Patient became hypotensive during this study in the study was therefore terminated. EXAM: MRI HEAD WITHOUT CONTRAST MRA HEAD WITHOUT CONTRAST MRA NECK WITHOUT CONTRAST TECHNIQUE:  Multiplanar, multiecho pulse sequences of the brain and surrounding structures were obtained without intravenous contrast. Angiographic images of the Circle of Willis were obtained using MRA technique without intravenous contrast. Angiographic images of the neck were obtained using MRA technique without intravenous contrast. Carotid stenosis measurements (when applicable) are obtained utilizing NASCET criteria, using the distal internal carotid diameter as the denominator. COMPARISON:  CT head without contrast 04/05/2015. MRI brain 02/16/2016. FINDINGS: MRI HEAD FINDINGS The diffusion-weighted images confirm that the inferior left cerebellar infarct is acute. Extensive T2 signal changes are associated with this new infarction. There is no associated hemorrhage. Two additional areas of acute nonhemorrhagic infarction are present within the central pons. There is some involvement of the left inferior vermis. Extensive punctate cortical acute nonhemorrhagic infarcts are present throughout the right hemisphere in what appears to be a watershed distribution. Multiple areas are present on the left is well, but less extensive. A focal area of cortical acute nonhemorrhagic infarction is noted along the left precentral gyrus. This area may affect the lower extremity. In addition of the areas of acute infarction, there are multiple remote infarcts of the cerebellum bilaterally and a right paramedian pontine infarct. Remote lacunar infarcts are present within the thalami bilaterally. Extensive white matter changes are present within the left external and posterior internal capsule. Encephalomalacia is present in the frontal lobes bilaterally with diffuse associated white matter disease. The internal auditory canals are within normal limits bilaterally. Flow is occluded in the right vertebral artery. The left vertebral artery and basilar artery are patent. The internal carotid arteries are patent bilaterally. Bilateral lens  extractions are noted. The paranasal sinuses are clear. Fluid is present in the mastoid air cells bilaterally. No obstructing nasopharyngeal lesion is evident. MRA HEAD FINDINGS Mild tortuosity is present within the right cervical internal carotid artery. The internal carotid arteries are otherwise within normal limits through the ICA termini bilaterally. There is a fenestration of the distal left A1 segment, a normal variant. No definite anterior communicating artery is present. The MCA bifurcations are intact bilaterally. There is moderate attenuation of distal MCA branch vessels bilaterally, worse on the right. ACA branch vessels are intact. 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 The left PICA origin is visualized and normal. The left vertebral artery is within normal limits. There is moderate stenosis of the mid basilar artery the posterior cerebral arteries are of fetal type bilaterally. There is significant attenuation of distal PCA branch vessels, left greater than right. MRA NECK FINDINGS Time-of-flight imaging through the neck demonstrates no significant flow disturbance at either carotid bifurcation. Flow is antegrade in the vertebral arteries bilaterally. The left vertebral artery is dominant. IMPRESSION: 1. 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 2. Two small acute nonhemorrhagic infarcts within the pons. 3. Multi focal punctate areas of acute nonhemorrhagic infarction over the convexities bilaterally, more prominent on the right. This appears to be in a watershed distribution. It embolic  infarcts are considered less likely. 4. More focal acute nonhemorrhagic infarct involving the left precentral gyrus measuring 15 mm along the primary motor cortex. 5. Multiple remote lacunar infarcts of the cerebellum bilaterally. 6. Extensive areas of encephalomalacia involving the frontal lobes bilaterally. 7. 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 8. Mild moderate narrowing  in the mid basilar artery. 9. Fetal type posterior cerebral arteries bilaterally. 10. The proximal anterior circulation is intact. 11. Moderate distal small vessel disease in the MCA branch vessels bilaterally, worse on the right. The pattern suggests the possibility of a vasculitis. 12. No significant flow disturbance at either carotid bifurcation. 13. Flow is antegrade in the vertebral arteries bilaterally. The left vertebral artery is dominant. Electronically Signed   By: San Morelle M.D.   On: 04/05/2016 17:30   Ct Abdomen Pelvis W Contrast  04/23/2016  CLINICAL DATA:  Dementia and altered mental status. Vomiting beginning last night. EXAM: CT ABDOMEN AND PELVIS WITH CONTRAST TECHNIQUE: Multidetector CT imaging of the abdomen and pelvis was performed using the standard protocol following bolus administration of intravenous contrast. CONTRAST:  75 cc Isovue 370 COMPARISON:  01/01/2016 FINDINGS: The liver has a normal appearance. There are gallstones layering dependent within the gallbladder. No CT evidence of cholecystitis or obstruction. The spleen is normal. The pancreas is normal. The adrenal glands are normal. Small benign renal cysts bilaterally. No mass, stone or hydronephrosis. There is atherosclerosis of the aorta but no aneurysm. Bilateral renal artery stenosis is present. No retroperitoneal mass or adenopathy. No free intraperitoneal fluid or air. Uterus and adnexal regions appear unremarkable. No primary bowel pathology. Chronic degenerative changes affect the spine, with 4 mm of anterolisthesis at L4-5 because of degenerative facet disease. IMPRESSION: No acute abdominal or pelvic finding. Atherosclerosis of the aorta and its branch vessels. Cholelithiasis without CT evidence of cholecystitis or obstruction. Electronically Signed   By: Nelson Chimes M.D.   On: 04/30/2016 11:03   Dg Chest Port 1 View  04/06/2016  CLINICAL DATA:  Respiratory failure EXAM: PORTABLE CHEST 1 VIEW COMPARISON:   04/04/2006 FINDINGS: Support devices are stable. Right lung is clear. Left base atelectasis with small left pleural effusion. No acute bony abnormality. IMPRESSION: Left basilar atelectasis with small left effusion. Electronically Signed   By: Rolm Baptise M.D.   On: 04/06/2016 07:27   Dg Chest Port 1 View  04/06/2016  CLINICAL DATA:  Central line placement.  Initial encounter. EXAM: PORTABLE CHEST 1 VIEW COMPARISON:  Chest radiograph performed earlier today at 8:56 p.m. FINDINGS: The patient's endotracheal tube is seen ending 1-2 cm above the carina. This could be retracted 1-2 cm. The right IJ line is noted ending about the mid SVC. The lungs are hypoexpanded. Vascular congestion is noted. Increased interstitial markings may reflect mild interstitial edema. No pleural effusion or pneumothorax is seen. The cardiomediastinal silhouette is borderline normal in size. No acute osseous abnormalities are identified. IMPRESSION: 1. Endotracheal tube seen ending 1-2 cm above the carina. This could be retracted 1-2 cm. 2. Right IJ line noted ending about the mid SVC. 3. Lungs hypoexpanded. Vascular congestion noted. Increased interstitial markings may reflect mild interstitial edema. Electronically Signed   By: Garald Balding M.D.   On: 04/18/2016 23:03   Dg Chest Port 1 View  04/24/2016  CLINICAL DATA:  Acute respiratory failure EXAM: PORTABLE CHEST 1 VIEW COMPARISON:  04/30/2016 FINDINGS: Endotracheal tube is 17 mm above the carina. NG tube enters the stomach. Heart is normal size. No confluent airspace opacities  or effusions. No acute bony abnormality. IMPRESSION: Endotracheal tube 17 mm above the carina. No acute findings. Electronically Signed   By: Rolm Baptise M.D.   On: 04/27/2016 21:09   Dg Chest Portable 1 View  04/20/2016  CLINICAL DATA:  Altered mental status and vomiting for 1 day, former smoker EXAM: PORTABLE CHEST 1 VIEW COMPARISON:  01/01/2016 FINDINGS: Limited inspiratory effect. Heart size upper  normal. Vascular pattern normal. Mild diffuse interstitial prominence stable. IMPRESSION: Chronic interstitial disease.  No acute findings. Electronically Signed   By: Skipper Cliche M.D.   On: 04/26/2016 09:02   Ct Angio Chest Aorta W/cm &/or Wo/cm  04/27/2016  CLINICAL DATA:  Dementia and altered mental status. Vomiting beginning last night. EXAM: CT ANGIOGRAPHY CHEST WITH CONTRAST TECHNIQUE: Multidetector CT imaging of the chest was performed using the standard protocol during bolus administration of intravenous contrast. Multiplanar CT image reconstructions and MIPs were obtained to evaluate the vascular anatomy. CONTRAST:  75 cc Isovue 370 COMPARISON:  Radiography same day. FINDINGS: Arterial opacification is excellent. There is atherosclerosis of the aorta but no aneurysm or dissection. Calcification of the brachiocephalic vessel origins but without evidence of flow-limiting stenosis. Pulmonary arteries appear normal without emboli. Coronary artery calcification is present. No pleural or pericardial fluid. There is pulmonary scarring with emphysema. No infiltrate or collapse. No mediastinal mass or lymphadenopathy. Scans in the upper abdomen show gallstones dependent within the gallbladder but no active process. Atherosclerosis of the upper abdominal aorta and its branch vessels without aneurysm. Bilateral renal artery stenosis. Review of the MIP images confirms the above findings. IMPRESSION: No dissection. No pulmonary emboli. Atherosclerosis of the aorta and its branch vessels. Coronary artery calcifications. Bilateral renal artery stenoses. Emphysema and pulmonary scarring. No active chest pathology otherwise. Electronically Signed   By: Nelson Chimes M.D.   On: 04/03/2016 10:59    Assessment and plan:   Laura Levy is an 75 y.o. female patient admitted with altered mental status, noted to have seizures based on clinical evaluation yesterday as described. Clinically appears stable now, on Keppra  1000 mg twice a day.  EEG done this morning did not show any evidence of electrographic seizures.  Evidence of mild to moderate encephalopathy noted. Her platelets initially were 14 and hence aspirin was not started at admission. They have recovered with platelet count of 128 this morning. We'll continue to monitor. Echocardiogram showed EF of 55%, evidence of thrombus.  MRI of the brain, MRA of the head and neck were ordered, pending.     update :  brain MRI showed multifocal acute infarcts in bilateral cerebral hemispheres as well as in cerebellum, with multiple cerebrovascular abnormalities on MRA of the head as describedbelow 1. 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 2. Two small acute nonhemorrhagic infarcts within the pons. 3. Multi focal punctate areas of acute nonhemorrhagic infarction over the convexities bilaterally, more prominent on the right. This appears to be in a watershed distribution. It embolic infarcts are considered less likely. 4. More focal acute nonhemorrhagic infarct involving the left precentral gyrus measuring 15 mm along the primary motor cortex. 5. Multiple remote lacunar infarcts of the cerebellum bilaterally. 6. Extensive areas of encephalomalacia involving the frontal lobes bilaterally. 7. 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 8. Mild moderate narrowing in the mid basilar artery. 9. Fetal type posterior cerebral arteries bilaterally. 10. The proximal anterior circulation is intact. 11. Moderate distal small vessel disease in the MCA branch vessels bilaterally, worse on  the right. The pattern suggests the possibility of a vasculitis. 12. No significant flow disturbance at either carotid bifurcation. 13. Flow is antegrade in the vertebral arteries bilaterally. The left vertebral artery is dominant.    Started on aspirin suppository 300 mg daily.  Neurology stroke team will continue to follow.

## 2016-04-07 ENCOUNTER — Inpatient Hospital Stay (HOSPITAL_COMMUNITY): Payer: Medicare Other

## 2016-04-07 DIAGNOSIS — I6340 Cerebral infarction due to embolism of unspecified cerebral artery: Principal | ICD-10-CM

## 2016-04-07 DIAGNOSIS — F0390 Unspecified dementia without behavioral disturbance: Secondary | ICD-10-CM

## 2016-04-07 DIAGNOSIS — E785 Hyperlipidemia, unspecified: Secondary | ICD-10-CM

## 2016-04-07 DIAGNOSIS — I4891 Unspecified atrial fibrillation: Secondary | ICD-10-CM

## 2016-04-07 LAB — THERAPEUTIC PLASMA EXCHANGE (BLOOD BANK)
PLASMA EXCHANGE: 2502
PLASMA VOLUME NEEDED: 2500
UNIT DIVISION: 0
UNIT DIVISION: 0
UNIT DIVISION: 0
UNIT DIVISION: 0
Unit division: 0
Unit division: 0
Unit division: 0
Unit division: 0

## 2016-04-07 LAB — HEMOGLOBIN A1C
Hgb A1c MFr Bld: 4.6 % — ABNORMAL LOW (ref 4.8–5.6)
MEAN PLASMA GLUCOSE: 85 mg/dL

## 2016-04-07 LAB — BASIC METABOLIC PANEL
ANION GAP: 5 (ref 5–15)
BUN: 5 mg/dL — ABNORMAL LOW (ref 6–20)
CO2: 28 mmol/L (ref 22–32)
Calcium: 8.4 mg/dL — ABNORMAL LOW (ref 8.9–10.3)
Chloride: 104 mmol/L (ref 101–111)
Creatinine, Ser: 1.21 mg/dL — ABNORMAL HIGH (ref 0.44–1.00)
GFR, EST AFRICAN AMERICAN: 51 mL/min — AB (ref >60)
GFR, EST NON AFRICAN AMERICAN: 44 mL/min — AB (ref >60)
GLUCOSE: 142 mg/dL — AB (ref 65–99)
POTASSIUM: 3.9 mmol/L (ref 3.5–5.1)
Sodium: 137 mmol/L (ref 135–145)

## 2016-04-07 LAB — POCT I-STAT, CHEM 8
BUN: 10 mg/dL (ref 6–20)
BUN: 6 mg/dL (ref 6–20)
CHLORIDE: 108 mmol/L (ref 101–111)
CREATININE: 1.1 mg/dL — AB (ref 0.44–1.00)
Calcium, Ion: 1.12 mmol/L — ABNORMAL LOW (ref 1.13–1.30)
Calcium, Ion: 1.11 mmol/L — ABNORMAL LOW (ref 1.13–1.30)
Chloride: 99 mmol/L — ABNORMAL LOW (ref 101–111)
Creatinine, Ser: 1.2 mg/dL — ABNORMAL HIGH (ref 0.44–1.00)
GLUCOSE: 135 mg/dL — AB (ref 65–99)
Glucose, Bld: 115 mg/dL — ABNORMAL HIGH (ref 65–99)
HCT: 27 % — ABNORMAL LOW (ref 36.0–46.0)
HEMATOCRIT: 21 % — AB (ref 36.0–46.0)
HEMOGLOBIN: 7.1 g/dL — AB (ref 12.0–15.0)
HEMOGLOBIN: 9.2 g/dL — AB (ref 12.0–15.0)
POTASSIUM: 3.5 mmol/L (ref 3.5–5.1)
Potassium: 4.1 mmol/L (ref 3.5–5.1)
SODIUM: 146 mmol/L — AB (ref 135–145)
Sodium: 140 mmol/L (ref 135–145)
TCO2: 21 mmol/L (ref 0–100)
TCO2: 24 mmol/L (ref 0–100)

## 2016-04-07 LAB — CBC WITH DIFFERENTIAL/PLATELET
Basophils Absolute: 0 10*3/uL (ref 0.0–0.1)
Basophils Relative: 0 %
EOS ABS: 0.5 10*3/uL (ref 0.0–0.7)
Eosinophils Relative: 3 %
HCT: 28 % — ABNORMAL LOW (ref 36.0–46.0)
HEMOGLOBIN: 8.8 g/dL — AB (ref 12.0–15.0)
LYMPHS ABS: 1.9 10*3/uL (ref 0.7–4.0)
LYMPHS PCT: 9 %
MCH: 28.5 pg (ref 26.0–34.0)
MCHC: 31.4 g/dL (ref 30.0–36.0)
MCV: 90.6 fL (ref 78.0–100.0)
Monocytes Absolute: 1.7 10*3/uL — ABNORMAL HIGH (ref 0.1–1.0)
Monocytes Relative: 8 %
NEUTROS PCT: 80 %
Neutro Abs: 16.3 10*3/uL — ABNORMAL HIGH (ref 1.7–7.7)
Platelets: 170 10*3/uL (ref 150–400)
RBC: 3.09 MIL/uL — AB (ref 3.87–5.11)
RDW: 16.3 % — ABNORMAL HIGH (ref 11.5–15.5)
WBC: 20.4 10*3/uL — AB (ref 4.0–10.5)

## 2016-04-07 LAB — TROPONIN I
TROPONIN I: 3.13 ng/mL — AB (ref <0.031)
TROPONIN I: 3.23 ng/mL — AB (ref <0.031)
TROPONIN I: 3.7 ng/mL — AB (ref <0.031)
Troponin I: 5.13 ng/mL (ref <0.031)

## 2016-04-07 LAB — MAGNESIUM
MAGNESIUM: 1.8 mg/dL (ref 1.7–2.4)
MAGNESIUM: 1.7 mg/dL (ref 1.7–2.4)

## 2016-04-07 LAB — PHOSPHORUS: Phosphorus: 3 mg/dL (ref 2.5–4.6)

## 2016-04-07 LAB — GLUCOSE, CAPILLARY: Glucose-Capillary: 127 mg/dL — ABNORMAL HIGH (ref 65–99)

## 2016-04-07 MED ORDER — SODIUM CHLORIDE 0.9 % IV SOLN
4.0000 g | Freq: Once | INTRAVENOUS | Status: AC
  Administered 2016-04-07: 4 g via INTRAVENOUS
  Filled 2016-04-07: qty 40

## 2016-04-07 MED ORDER — ACD FORMULA A 0.73-2.45-2.2 GM/100ML VI SOLN
Status: AC
  Administered 2016-04-07: 500 mL
  Filled 2016-04-07: qty 1000

## 2016-04-07 MED ORDER — VITAL HIGH PROTEIN PO LIQD: 1000.0000 mL | ORAL | Status: DC

## 2016-04-07 MED ORDER — VITAL AF 1.2 CAL PO LIQD
1000.0000 mL | ORAL | Status: DC
  Administered 2016-04-07 – 2016-04-13 (×6): 1000 mL
  Filled 2016-04-07 (×3): qty 1000

## 2016-04-07 MED ORDER — ASPIRIN EC 81 MG PO TBEC: 81.0000 mg | DELAYED_RELEASE_TABLET | Freq: Every day | ORAL | Status: DC

## 2016-04-07 MED ORDER — NOREPINEPHRINE BITARTRATE 1 MG/ML IV SOLN
0.0000 ug/min | INTRAVENOUS | Status: DC
  Administered 2016-04-07: 14 ug/min via INTRAVENOUS
  Administered 2016-04-07: 22 ug/min via INTRAVENOUS
  Filled 2016-04-07 (×5): qty 4

## 2016-04-07 MED ORDER — ACETAMINOPHEN 325 MG PO TABS: 650.0000 mg | ORAL_TABLET | ORAL | Status: DC | PRN

## 2016-04-07 MED ORDER — FAMOTIDINE IN NACL 20-0.9 MG/50ML-% IV SOLN
20.0000 mg | Freq: Two times a day (BID) | INTRAVENOUS | Status: DC
  Administered 2016-04-08 – 2016-04-13 (×13): 20 mg via INTRAVENOUS
  Filled 2016-04-07 (×13): qty 50

## 2016-04-07 MED ORDER — ACD FORMULA A 0.73-2.45-2.2 GM/100ML VI SOLN
500.0000 mL | Status: DC
Start: 2016-04-07 — End: 2016-04-14
  Filled 2016-04-07 (×3): qty 500

## 2016-04-07 MED ORDER — DIPHENHYDRAMINE HCL 25 MG PO CAPS: 25.0000 mg | ORAL_CAPSULE | Freq: Four times a day (QID) | ORAL | Status: DC | PRN

## 2016-04-07 MED ORDER — ANTICOAGULANT SODIUM CITRATE 4% (200MG/5ML) IV SOLN
5.0000 mL | Freq: Once | Status: AC
  Administered 2016-04-07: 5 mL
  Filled 2016-04-07: qty 250

## 2016-04-07 MED ORDER — ASPIRIN EC 81 MG PO TBEC
81.0000 mg | DELAYED_RELEASE_TABLET | Freq: Every day | ORAL | Status: DC
Start: 2016-04-08 — End: 2016-04-08
  Filled 2016-04-07: qty 1

## 2016-04-07 NOTE — Progress Notes (Addendum)
Carotid artery duplex order received. Patient has line in right neck, please notify us when line is removed/if only left side evaluation is needed.  Per Dr. Roda ShuttersXu, wait until line is removed to complete bilateral carotid artery duplex.  04/07/2016 9:31 AM Laura Levy, RVT, RDCS, RDMS  Vascular lab- [phone]

## 2016-04-07 NOTE — Clinical Documentation Improvement (Addendum)
Internal Medicine Critical Care  Can the diagnosis of Atrial Fibrillation be further specified by type? Thank you   Chronic Atrial fibrillation  Paroxysmal Atrial fibrillation  Permanent Atrial fibrillation  Persistent Atrial fibrillation  Other  Clinically Undetermined  Document any associated diagnoses/conditions.   Supporting Information: HTN, Atrial Flutter     Treatment: started on IV Amiodarone gtt    Please exercise your independent, professional judgment when responding. A specific answer is not anticipated or expected.   Thank You,  Lavonda JumboLawanda J Anureet Bruington Health Information Management [place] [phone]

## 2016-04-07 NOTE — Clinical Documentation Improvement (Addendum)
Internal Medicine Critical Care  Can the diagnosis of Atrial Flutter be further specified by type? Thank you    Atypical (Type II)  Typical (Type I)  Other  Clinically Undetermined  Document any associated diagnoses/conditions.   Supporting Information: HTN,  Atrial Fibrillation     Treatment: Started on IV Amiodarone    Please exercise your independent, professional judgment when responding. A specific answer is not anticipated or expected.   Thank You,  Lavonda JumboLawanda J Edin Kon Health Information Management [place] [phone]

## 2016-04-07 NOTE — Care Management Note (Signed)
Case Management Note  Patient Details  Name: Montine CircleVera M Tener MRN: [id number] Date of Birth: 12-12-1940  Subjective/Objective:    Pt admitted on 04/07/2016 with AMS, acute stroke, and thrombocytopenia.  PTA, pt resided at home with daughter.  She is fairly independent, per family.   Daughter works at [employer].                  Action/Plan: Pt remains sedated and on ventilator at this time.  Will follow for discharge planning as pt progresses.    Expected Discharge Date:                  Expected Discharge Plan:  Skilled Nursing Facility  In-House Referral:     Discharge planning Services  CM Consult  Post Acute Care Choice:    Choice offered to:     DME Arranged:    DME Agency:     HH Arranged:    HH Agency:     Status of Service:  In process, will continue to follow  Medicare Important Message Given:  Yes Date Medicare IM Given:    Medicare IM give by:    Date Additional Medicare IM Given:    Additional Medicare Important Message give by:     If discussed at Long Length of Stay Meetings, dates discussed:    Additional Comments:  Quintella BatonJulie W. Henryetta Corriveau, RN, BSN  Trauma/Neuro ICU Case Manager [phone]

## 2016-04-07 NOTE — Progress Notes (Signed)
OT Cancellation Note  Patient Details Name: Montine CircleVera M Gravely MRN: [id number] DOB: 1941-06-06   Cancelled Treatment:    Reason Eval/Treat Not Completed: Medical issues which prohibited therapy  Bjosc LLCWARD,HILLARY  Jessicamarie Amiri, OTR/L  [phone] 04/07/2016 04/07/2016, 7:56 AM

## 2016-04-07 NOTE — Progress Notes (Signed)
STROKE TEAM PROGRESS NOTE   SUBJECTIVE (INTERVAL HISTORY) No family members present. Plasmapharesis is underway. The patient is still intubated and not following commands. Has family meeting this am.    OBJECTIVE Temp:  [97.8 F (36.6 C)-99.9 F (37.7 C)] 99.1 F (37.3 C) (06/05 1600) Pulse Rate:  [70-111] 70 (06/05 1700) Cardiac Rhythm:  [-] Normal sinus rhythm (06/05 1600) Resp:  [7-23] 18 (06/05 1700) BP: (81-129)/(49-85) 120/55 mmHg (06/05 1700) SpO2:  [91 %-100 %] 100 % (06/05 1700) FiO2 (%):  [40 %] 40 % (06/05 1600) Weight:  [149 lb 11.1 oz (67.9 kg)-153 lb 10.6 oz (69.7 kg)] 153 lb 10.6 oz (69.7 kg) (06/05 1025)  CBC:   Recent Labs Lab 04/06/16 0411 04/07/16 0330 04/07/16 0821  WBC 23.7* 20.4*  --   NEUTROABS 18.3* 16.3*  --   HGB 9.0* 8.8* 9.2*  HCT 27.1* 28.0* 27.0*  MCV 86.9 90.6  --   PLT 123* 170  --     Basic Metabolic Panel:   Recent Labs Lab 04/06/16 0411 04/06/16 2000  04/07/16 0330 04/07/16 0821 04/07/16 1545  NA 136 141  --  137 140  --   K 2.9* 3.8  --  3.9 4.1  --   CL 102 107  --  104 99*  --   CO2 26 30  --  28  --   --   GLUCOSE 135* 137*  --  142* 135*  --   BUN 8 <5*  --  5* 6  --   CREATININE 1.16* 1.14*  --  1.21* 1.10*  --   CALCIUM 8.0* 8.6*  --  8.4*  --   --   MG 1.3*  --   < > 1.8  --  1.7  PHOS 2.7  --   --   --   --  3.0  < > = values in this interval not displayed.  Lipid Panel:     Component Value Date/Time   CHOL 120 04/05/2016 0524   CHOL 116 08/13/2012 0424   TRIG 57 04/05/2016 0524   TRIG 47 08/13/2012 0424   HDL 37* 04/05/2016 0524   HDL 45 08/13/2012 0424   CHOLHDL 3.2 04/05/2016 0524   VLDL 11 04/05/2016 0524   VLDL 9 08/13/2012 0424   LDLCALC 72 04/05/2016 0524   LDLCALC 62 08/13/2012 0424   HgbA1c:  Lab Results  Component Value Date   HGBA1C 4.6* 04/05/2016   Urine Drug Screen: No results found for: LABOPIA, COCAINSCRNUR, LABBENZ, AMPHETMU, THCU, LABBARB    IMAGING I have personally reviewed  the radiological images below and agree with the radiology interpretations.  Mr Maxine GlennMra Head Wo Contrast 04/05/2016   1. 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  2. Two small acute nonhemorrhagic infarcts within the pons.  3. Multi focal punctate areas of acute nonhemorrhagic infarction over the convexities bilaterally, more prominent on the right. This appears to be in a watershed distribution. Embolic infarcts are considered less likely.  4. More focal acute nonhemorrhagic infarct involving the left precentral gyrus measuring 15 mm along the primary motor cortex.  5. Multiple remote lacunar infarcts of the cerebellum bilaterally.  6. Extensive areas of encephalomalacia involving the frontal lobes bilaterally.  7. 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  8. Mild moderate narrowing in the mid basilar artery.  9. Fetal type posterior cerebral arteries bilaterally.  10. The proximal anterior circulation is intact.  11. Moderate distal small vessel disease in  the MCA branch vessels bilaterally, worse on the right. The pattern suggests the possibility of a vasculitis.  12. No significant flow disturbance at either carotid bifurcation.  13. Flow is antegrade in the vertebral arteries bilaterally. The left vertebral artery is dominant.   Transthoracic echocardiogram 04/05/2016 Study Conclusions - Left ventricle: The cavity size was normal. There was mild focal  basal hypertrophy of the septum. Systolic function was normal.  The estimated ejection fraction was in the range of 50% to 55%.  Probable hypokinesis of the apicalanteroseptal, anterior, and  apical myocardium. Doppler parameters are consistent with  abnormal left ventricular relaxation (grade 1 diastolic  dysfunction). - Mitral valve: Calcified annulus. There was mild to moderate  regurgitation. - Tricuspid valve: There was mild-moderate regurgitation directed  centrally. - Pulmonary  arteries: Systolic pressure was mildly increased. PA  peak pressure: 34 mm Hg (S).  EEG 04/06/2016 Impression:  Abnormal routine inpatient EEG suggestive of at least moderate encephalopathy as described. Clinical correlation is recommended .   CUS - pending due to central line in the neck    PHYSICAL EXAM Frail cachectic elderly African-American lady intubated. On ventilatory support. Afebrile. Head is nontraumatic. Neck is supple with central line placement. Cardiac exam RRR.   Neurological Exam :  Patient is intubated. Eyes are open with left gaze. She she does not blink to threat on either side. Patient does not follow any commands. Pupils irregular 3 mm but reactive. Doll's eye movements are sluggish. Fundi could not be visualized. Tongue is midline. Motor system exam reveals spastic right hemiparesis with increased tone with fixed flexion contracture of the fingers and the wrist on the right. Patient has significant paratonia on the left and will resist moments. She does withdraw to pain slightly more on the left LE compared to the right LE. Both plantars are upgoing. Gait cannot be tested.    ASSESSMENT/PLAN Ms. PURVA VESSELL is a 75 y.o. female with history of hypertension, previous strokes, and dementia, presenting obtunded. She did not receive IV t-PA due to late presentation.  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   Resultant - intubated and obtunded. 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 Fetal PCA bilaterally.  Carotid Doppler - pending  2D Echo - EF 50-55%. No cardiac source of emboli identified.  EEG - moderate encephalopathy.  LDL - 72  HgbA1c 4.6  VTE prophylaxis - SCDs Diet NPO time specified  aspirin 81 mg daily and clopidogrel 75 mg daily prior to admission, now on ASA   Ongoing aggressive stroke risk  factor management  Therapy recommendations: Pending  Disposition: Pending  TTP  Typical presentation with AMS, vomiting, abdominal pain, thrombocytopenia   Platelet 13->14->128->123->170  Respond to plasmapheresis well   Peripheral blood smear confirmed schistocytes  Hematology on board  History of stroke  Residue of right spastic hemiparesis  Vascular dementia  Walk with cane at home  Need assistance with ADLs   ? Seizure  Status epilepticus suspected at time of admission  EEG no seizure  On keppra  bid  Hypertension  Blood pressure tends to run somewhat low Permissive hypertension (OK if < 220/120) but gradually normalize in 5-7 days  Hyperlipidemia  Home meds:  Lipitor 80 mg daily prior to admission  LDL 72, goal < 70  Avoid lipitor this time due to TTP  Other Stroke  Risk Factors  Advanced age  Hx stroke/TIA  Other Active Problems  Anemia  Leukocytosis   Hypokalemia -  supplemented  Plavix allergy listed - potential cause for TTP  Family meeting underway  Hospital day # 3  This patient is critically ill due to embolic stroke, TTP, anemia, seizure and at significant risk of neurological worsening, death form recurrent stroke, bleeding, status epilepticus. This patient's care requires constant monitoring of vital signs, hemodynamics, respiratory and cardiac monitoring, review of multiple databases, neurological assessment, discussion with family, other specialists and medical decision making of high complexity. I spent 35 minutes of neurocritical care time in the care of this patient.  Marvel Plan, MD PhD Stroke Neurology 04/07/2016 7:14 PM  To contact Stroke Continuity provider, please refer to [email or web address]. After hours, contact General Neurology

## 2016-04-07 NOTE — Progress Notes (Signed)
PT Cancellation Note  Patient Details Name: Laura Levy MRN: [id number] DOB: [name]   Cancelled Treatment:    Reason Eval/Treat Not Completed: Medical issues which prohibited therapy spoke with nsg, will hold therapies at this time, also awaiting clearance/ downtrend of elevated troponnins   [name], [name] J 04/07/2016, 7:41 AM [name], PT DPT  [phone]

## 2016-04-07 NOTE — Progress Notes (Signed)
Nutrition Follow-up  INTERVENTION:   Initiate Vital AF 1.2 @ 50 ml/hr Provides: 1440 kcal, 90 grams protein, and 973 ml H2O.   NUTRITION DIAGNOSIS:   Inadequate oral intake related to inability to eat as evidenced by NPO status. Ongoing.   GOAL:   Patient will meet greater than or equal to 90% of their needs Progressing.   MONITOR:   Diet advancement, Vent status, Labs, I & O's  REASON FOR ASSESSMENT:   Consult Enteral/tube feeding initiation and management  ASSESSMENT:   75 yo woman with HTN, mild dementia, hx CVA's admitted 6/2 with acute altered MS. Eval has revealed B cerebellar CVA's, mild acute renal failure, severe thrombocytopenia w schistocytes, NSTEMI. Suspect cerebellar and cardiac ischemia due to microvascular disease and pro-thrombotic state as opposed to macroembolism. Entire picture consistent w TTP, appears to be responding to plasmaphoresis. MRI confirms cerebellar CVA's, predicts poor prognosis  Patient is currently intubated on ventilator support MV: 7.9 L/min Temp (24hrs), Avg:98.7 F (37.1 C), Min:97.8 F (36.6 C), Max:99.9 F (37.7 C)    Diet Order:  Diet NPO time specified  Skin:  Reviewed, no issues  Last BM:  6/4  Height:   Ht Readings from Last 1 Encounters:  Oct 15, 2016 5\' 3"  (1.6 m)    Weight:   Wt Readings from Last 1 Encounters:  04/07/16 153 lb 10.6 oz (69.7 kg)    Ideal Body Weight:  52.27 kg  BMI:  Body mass index is 27.23 kg/(m^2).  Estimated Nutritional Needs:   Kcal:  1418  Protein:  77-90 g (1.2-1.4 g/kg bw)  Fluid:  1.9 liters  EDUCATION NEEDS:   No education needs identified at this time  Kendell BaneHeather Analiah Drum RD, LDN, CNSC [phone] Pager [phone] After Hours Pager

## 2016-04-07 NOTE — Care Management Important Message (Signed)
Important Message  Patient Details  Name: Montine CircleVera M Featherly MRN: [id number] Date of Birth: [birth year]   Medicare Important Message Given:  Yes    Kyla BalzarineShealy, Ace Bergfeld Abena 04/07/2016, 11:58 AM

## 2016-04-07 NOTE — Progress Notes (Signed)
Laura Levy   DOB:Feb 13, 1941   NF#:[id number]    Subjective: I saw the patient this morning at 7 AM. Multiple family members including her son are present. The patient remained intubated, sedated and ventilated on aggressive supportive measures  Objective:  Filed Vitals:   04/07/16 1045 04/07/16 1100  BP: 110/53 95/51  Pulse: 81 111  Temp:    Resp: 18 23     Intake/Output Summary (Last 24 hours) at 04/07/16 1118 Last data filed at 04/07/16 1100  Gross per 24 hour  Intake 3166.82 ml  Output   2620 ml  Net 546.82 ml    GENERAL:Sedated and intubated SKIN: skin color, texture, turgor are normal, no rashes or significant lesions EYES: Eyes are open intermittently, pale OROPHARYNX: Intubated  LUNGS: clear to auscultation and percussion with normal breathing effort. On ventilator HEART:irregular rate & rhythm and no murmurs and no lower extremity edema ABDOMEN:abdomen soft, non-tender and normal bowel sounds Musculoskeletal:no cyanosis of digits and no clubbing  NEURO: Unable to assess   Labs:  Lab Results  Component Value Date   WBC 20.4* 04/07/2016   HGB 8.8* 04/07/2016   HCT 28.0* 04/07/2016   MCV 90.6 04/07/2016   PLT 170 04/07/2016   NEUTROABS 16.3* 04/07/2016    Lab Results  Component Value Date   NA 137 04/07/2016   K 3.9 04/07/2016   CL 104 04/07/2016   CO2 28 04/07/2016    Studies:  Mr [name] Head Wo Contrast  04/05/2016  CLINICAL DATA:  Altered mental status. Abnormal balance and slurred speech with associated vomiting. Symptoms progressed prior to admission. Abnormal CT scan. Study was ordered with and without contrast. Patient became hypotensive during this study in the study was therefore terminated. EXAM: MRI HEAD WITHOUT CONTRAST MRA HEAD WITHOUT CONTRAST MRA NECK WITHOUT CONTRAST TECHNIQUE: Multiplanar, multiecho pulse sequences of the brain and surrounding structures were obtained without intravenous contrast. Angiographic images of the Circle of Willis were  obtained using MRA technique without intravenous contrast. Angiographic images of the neck were obtained using MRA technique without intravenous contrast. Carotid stenosis measurements (when applicable) are obtained utilizing NASCET criteria, using the distal internal carotid diameter as the denominator. COMPARISON:  CT head without contrast 04/05/2015. MRI brain 02/16/2016. FINDINGS: MRI HEAD FINDINGS The diffusion-weighted images confirm that the inferior left cerebellar infarct is acute. Extensive T2 signal changes are associated with this new infarction. There is no associated hemorrhage. Two additional areas of acute nonhemorrhagic infarction are present within the central pons. There is some involvement of the left inferior vermis. Extensive punctate cortical acute nonhemorrhagic infarcts are present throughout the right hemisphere in what appears to be a watershed distribution. Multiple areas are present on the left is well, but less extensive. A focal area of cortical acute nonhemorrhagic infarction is noted along the left precentral gyrus. This area may affect the lower extremity. In addition of the areas of acute infarction, there are multiple remote infarcts of the cerebellum bilaterally and a right paramedian pontine infarct. Remote lacunar infarcts are present within the thalami bilaterally. Extensive white matter changes are present within the left external and posterior internal capsule. Encephalomalacia is present in the frontal lobes bilaterally with diffuse associated white matter disease. The internal auditory canals are within normal limits bilaterally. Flow is occluded in the right vertebral artery. The left vertebral artery and basilar artery are patent. The internal carotid arteries are patent bilaterally. Bilateral lens extractions are noted. The paranasal sinuses are clear. Fluid is present in the  mastoid air cells bilaterally. No obstructing nasopharyngeal lesion is evident. MRA HEAD  FINDINGS Mild tortuosity is present within the right cervical internal carotid artery. The internal carotid arteries are otherwise within normal limits through the ICA termini bilaterally. There is a fenestration of the distal left A1 segment, a normal variant. No definite anterior communicating artery is present. The MCA bifurcations are intact bilaterally. There is moderate attenuation of distal MCA branch vessels bilaterally, worse on the right. ACA branch vessels are intact. 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 The left PICA origin is visualized and normal. The left vertebral artery is within normal limits. There is moderate stenosis of the mid basilar artery the posterior cerebral arteries are of fetal type bilaterally. There is significant attenuation of distal PCA branch vessels, left greater than right. MRA NECK FINDINGS Time-of-flight imaging through the neck demonstrates no significant flow disturbance at either carotid bifurcation. Flow is antegrade in the vertebral arteries bilaterally. The left vertebral artery is dominant. IMPRESSION: 1. 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 2. Two small acute nonhemorrhagic infarcts within the pons. 3. Multi focal punctate areas of acute nonhemorrhagic infarction over the convexities bilaterally, more prominent on the right. This appears to be in a watershed distribution. It embolic infarcts are considered less likely. 4. More focal acute nonhemorrhagic infarct involving the left precentral gyrus measuring 15 mm along the primary motor cortex. 5. Multiple remote lacunar infarcts of the cerebellum bilaterally. 6. Extensive areas of encephalomalacia involving the frontal lobes bilaterally. 7. 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 8. Mild moderate narrowing in the mid basilar artery. 9. Fetal type posterior cerebral arteries bilaterally. 10. The proximal anterior circulation is intact. 11. Moderate distal small vessel  disease in the MCA branch vessels bilaterally, worse on the right. The pattern suggests the possibility of a vasculitis. 12. No significant flow disturbance at either carotid bifurcation. 13. Flow is antegrade in the vertebral arteries bilaterally. The left vertebral artery is dominant. Electronically Signed   By: Marin Roberts M.D.   On: 04/05/2016 17:30   Mr Angiogram Neck Wo Contrast  04/05/2016  CLINICAL DATA:  Altered mental status. Abnormal balance and slurred speech with associated vomiting. Symptoms progressed prior to admission. Abnormal CT scan. Study was ordered with and without contrast. Patient became hypotensive during this study in the study was therefore terminated. EXAM: MRI HEAD WITHOUT CONTRAST MRA HEAD WITHOUT CONTRAST MRA NECK WITHOUT CONTRAST TECHNIQUE: Multiplanar, multiecho pulse sequences of the brain and surrounding structures were obtained without intravenous contrast. Angiographic images of the Circle of Willis were obtained using MRA technique without intravenous contrast. Angiographic images of the neck were obtained using MRA technique without intravenous contrast. Carotid stenosis measurements (when applicable) are obtained utilizing NASCET criteria, using the distal internal carotid diameter as the denominator. COMPARISON:  CT head without contrast 04/05/2015. MRI brain 02/16/2016. FINDINGS: MRI HEAD FINDINGS The diffusion-weighted images confirm that the inferior left cerebellar infarct is acute. Extensive T2 signal changes are associated with this new infarction. There is no associated hemorrhage. Two additional areas of acute nonhemorrhagic infarction are present within the central pons. There is some involvement of the left inferior vermis. Extensive punctate cortical acute nonhemorrhagic infarcts are present throughout the right hemisphere in what appears to be a watershed distribution. Multiple areas are present on the left is well, but less extensive. A focal area of  cortical acute nonhemorrhagic infarction is noted along the left precentral gyrus. This area may affect the lower extremity. In addition  of the areas of acute infarction, there are multiple remote infarcts of the cerebellum bilaterally and a right paramedian pontine infarct. Remote lacunar infarcts are present within the thalami bilaterally. Extensive white matter changes are present within the left external and posterior internal capsule. Encephalomalacia is present in the frontal lobes bilaterally with diffuse associated white matter disease. The internal auditory canals are within normal limits bilaterally. Flow is occluded in the right vertebral artery. The left vertebral artery and basilar artery are patent. The internal carotid arteries are patent bilaterally. Bilateral lens extractions are noted. The paranasal sinuses are clear. Fluid is present in the mastoid air cells bilaterally. No obstructing nasopharyngeal lesion is evident. MRA HEAD FINDINGS Mild tortuosity is present within the right cervical internal carotid artery. The internal carotid arteries are otherwise within normal limits through the ICA termini bilaterally. There is a fenestration of the distal left A1 segment, a normal variant. No definite anterior communicating artery is present. The MCA bifurcations are intact bilaterally. There is moderate attenuation of distal MCA branch vessels bilaterally, worse on the right. ACA branch vessels are intact. 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 The left PICA origin is visualized and normal. The left vertebral artery is within normal limits. There is moderate stenosis of the mid basilar artery the posterior cerebral arteries are of fetal type bilaterally. There is significant attenuation of distal PCA branch vessels, left greater than right. MRA NECK FINDINGS Time-of-flight imaging through the neck demonstrates no significant flow disturbance at either carotid bifurcation. Flow is antegrade in the  vertebral arteries bilaterally. The left vertebral artery is dominant. IMPRESSION: 1. 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 2. Two small acute nonhemorrhagic infarcts within the pons. 3. Multi focal punctate areas of acute nonhemorrhagic infarction over the convexities bilaterally, more prominent on the right. This appears to be in a watershed distribution. It embolic infarcts are considered less likely. 4. More focal acute nonhemorrhagic infarct involving the left precentral gyrus measuring 15 mm along the primary motor cortex. 5. Multiple remote lacunar infarcts of the cerebellum bilaterally. 6. Extensive areas of encephalomalacia involving the frontal lobes bilaterally. 7. 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 8. Mild moderate narrowing in the mid basilar artery. 9. Fetal type posterior cerebral arteries bilaterally. 10. The proximal anterior circulation is intact. 11. Moderate distal small vessel disease in the MCA branch vessels bilaterally, worse on the right. The pattern suggests the possibility of a vasculitis. 12. No significant flow disturbance at either carotid bifurcation. 13. Flow is antegrade in the vertebral arteries bilaterally. The left vertebral artery is dominant. Electronically Signed   By: Marin Roberts M.D.   On: 04/05/2016 17:30   Mr Brain Wo Contrast  04/05/2016  CLINICAL DATA:  Altered mental status. Abnormal balance and slurred speech with associated vomiting. Symptoms progressed prior to admission. Abnormal CT scan. Study was ordered with and without contrast. Patient became hypotensive during this study in the study was therefore terminated. EXAM: MRI HEAD WITHOUT CONTRAST MRA HEAD WITHOUT CONTRAST MRA NECK WITHOUT CONTRAST TECHNIQUE: Multiplanar, multiecho pulse sequences of the brain and surrounding structures were obtained without intravenous contrast. Angiographic images of the Circle of Willis were obtained using MRA technique  without intravenous contrast. Angiographic images of the neck were obtained using MRA technique without intravenous contrast. Carotid stenosis measurements (when applicable) are obtained utilizing NASCET criteria, using the distal internal carotid diameter as the denominator. COMPARISON:  CT head without contrast 04/05/2015. MRI brain 02/16/2016. FINDINGS: MRI HEAD FINDINGS The  diffusion-weighted images confirm that the inferior left cerebellar infarct is acute. Extensive T2 signal changes are associated with this new infarction. There is no associated hemorrhage. Two additional areas of acute nonhemorrhagic infarction are present within the central pons. There is some involvement of the left inferior vermis. Extensive punctate cortical acute nonhemorrhagic infarcts are present throughout the right hemisphere in what appears to be a watershed distribution. Multiple areas are present on the left is well, but less extensive. A focal area of cortical acute nonhemorrhagic infarction is noted along the left precentral gyrus. This area may affect the lower extremity. In addition of the areas of acute infarction, there are multiple remote infarcts of the cerebellum bilaterally and a right paramedian pontine infarct. Remote lacunar infarcts are present within the thalami bilaterally. Extensive white matter changes are present within the left external and posterior internal capsule. Encephalomalacia is present in the frontal lobes bilaterally with diffuse associated white matter disease. The internal auditory canals are within normal limits bilaterally. Flow is occluded in the right vertebral artery. The left vertebral artery and basilar artery are patent. The internal carotid arteries are patent bilaterally. Bilateral lens extractions are noted. The paranasal sinuses are clear. Fluid is present in the mastoid air cells bilaterally. No obstructing nasopharyngeal lesion is evident. MRA HEAD FINDINGS Mild tortuosity is present  within the right cervical internal carotid artery. The internal carotid arteries are otherwise within normal limits through the ICA termini bilaterally. There is a fenestration of the distal left A1 segment, a normal variant. No definite anterior communicating artery is present. The MCA bifurcations are intact bilaterally. There is moderate attenuation of distal MCA branch vessels bilaterally, worse on the right. ACA branch vessels are intact. 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 The left PICA origin is visualized and normal. The left vertebral artery is within normal limits. There is moderate stenosis of the mid basilar artery the posterior cerebral arteries are of fetal type bilaterally. There is significant attenuation of distal PCA branch vessels, left greater than right. MRA NECK FINDINGS Time-of-flight imaging through the neck demonstrates no significant flow disturbance at either carotid bifurcation. Flow is antegrade in the vertebral arteries bilaterally. The left vertebral artery is dominant. IMPRESSION: 1. 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 2. Two small acute nonhemorrhagic infarcts within the pons. 3. Multi focal punctate areas of acute nonhemorrhagic infarction over the convexities bilaterally, more prominent on the right. This appears to be in a watershed distribution. It embolic infarcts are considered less likely. 4. More focal acute nonhemorrhagic infarct involving the left precentral gyrus measuring 15 mm along the primary motor cortex. 5. Multiple remote lacunar infarcts of the cerebellum bilaterally. 6. Extensive areas of encephalomalacia involving the frontal lobes bilaterally. 7. 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 8. Mild moderate narrowing in the mid basilar artery. 9. Fetal type posterior cerebral arteries bilaterally. 10. The proximal anterior circulation is intact. 11. Moderate distal small vessel disease in the MCA branch vessels  bilaterally, worse on the right. The pattern suggests the possibility of a vasculitis. 12. No significant flow disturbance at either carotid bifurcation. 13. Flow is antegrade in the vertebral arteries bilaterally. The left vertebral artery is dominant. Electronically Signed   By: Marin Robertshristopher  Mattern M.D.   On: 04/05/2016 17:30   Dg Chest Port 1 View  04/07/2016  CLINICAL DATA:  Respiratory failure EXAM: PORTABLE CHEST 1 VIEW COMPARISON:  04/06/2016 FINDINGS: Tubular device is stable. Low volumes. Bibasilar atelectasis. Small right pleural effusion is  suspected. The left pleural effusion suspected previously is not appreciated on today's study. Vascular congestion without interstitial edema. IMPRESSION: Bibasilar atelectasis and vascular congestion are not significantly changed. Small right pleural effusion. Electronically Signed   By: Jolaine Click M.D.   On: 04/07/2016 07:36   Dg Chest Port 1 View  04/06/2016  CLINICAL DATA:  Respiratory failure EXAM: PORTABLE CHEST 1 VIEW COMPARISON:  04/04/2006 FINDINGS: Support devices are stable. Right lung is clear. Left base atelectasis with small left pleural effusion. No acute bony abnormality. IMPRESSION: Left basilar atelectasis with small left effusion. Electronically Signed   By: Charlett Nose M.D.   On: 04/06/2016 07:27   Dg Abd Portable 1v  04/07/2016  CLINICAL DATA:  OG tube placement today. EXAM: PORTABLE ABDOMEN - 1 VIEW COMPARISON:  None. FINDINGS: OG tube is in place with the tip in the distal stomach. Bowel gas pattern is unremarkable. IMPRESSION: OG tube in good position. Electronically Signed   By: Drusilla Kanner M.D.   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 The patient has no known liver disease although total bilirubin has been chronically elevated since February 2017, in the absence of concurrent elevation of other liver  enzymes Review of peripheral smear is suspicious for TTP and she received emergent plasmapheresis started 04/15/2016 She has responded well to plasmapheresis with improvement of platelet count  I recommend holding off plasmapheresis after today  Acute anemia with elevated bilirubin, suspect hemolysis, status post blood transfusion Her hemoglobin is stable since blood transfusion Plan to transfuse when necessary to keep hemoglobin greater than 7  Mild acute on chronic renal failure, stable Could be related to dehydration in view of recent poor oral intake and nausea CT scan show evidence of bilateral renal artery stenosis but no other pathology related to renal obstruction  Altered mental status with recent witnessed seizure Significant bilateral infarct Appreciate neurology consultation She is started on Keppra  Elevated troponin Cause is unknown but acute coronary event on background history of ischemic heart disease is a possibility given history of smoking. TTP can also cause possibility of thrombotic complications With improvement of platelet count, there is no contraindication for her to be placed on heparin or aspirin I highly recommend NOT to put her back on Plavix as it has been described as a potential cause for TTP She is currently on amiodarone for atrial fibrillation  Goals of care I have a brief discussion with the son. Even though she is responding to plasmapheresis, the damage to her cardiac function and her brain may not be reversible I think the son has unrealistic expectation that her mother will improve to the point of her baseline similar to prior admission Prognosis with TTP is typically very poor I highly recommend involvement of palliative care consult in the near future  Discharge planning The patient is very ill. She will remain in ICU for now I will return on Wednesday to follow  [facility], Lasalle Abee, MD 04/07/2016  11:18 AM

## 2016-04-07 NOTE — Progress Notes (Signed)
PULMONARY / CRITICAL CARE MEDICINE   Name: Laura Levy MRN: [id number] DOB: [birth year]    ADMISSION DATE:  04/27/2016 CONSULTATION DATE:  04/07/2016   REFERRING MD:  Dr Elvera Lennox  CHIEF COMPLAINT:  Altermed MS and thrombocytopenia   HISTORY OF PRESENT ILLNESS:   Hx is obtained from notes and family, pt unable to give hx  75 y.o. female with medical history significant of hypertension, prior strokes, dementia, presented to [facility] emergency room 6/2 with chief complaint of altered mental status, slurred speech beginning the evening prior. CT scan showed subacute versus acute stroke bilateral cerebellum. 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 She transferred to [facility], has experienced progressive decline in wakefulness and airway protection. She was then noted to have rhythmic movements consistent w seizure activity, better after ativan. She moved to ICU and CCM  consulted to assist with her care.    SUBJECTIVE:  New a fib overnight > amiodarone started  VITAL SIGNS: BP 106/50 mmHg  Pulse 88  Temp(Src) 98.6 F (37 C) (Axillary)  Resp 18  Ht  (1.6 m)  Wt 69.7 kg (153 lb 10.6 oz)  BMI 27.23 kg/m2  SpO2 100%  HEMODYNAMICS:    VENTILATOR SETTINGS: Vent Mode:  [-] PRVC FiO2 (%):  [40 %] 40 % Set Rate:  [18 bmp] 18 bmp Vt Set:  [420 mL] 420 mL PEEP:  [5 cmH20] 5 cmH20 Plateau Pressure:  [18 cmH20-19 cmH20] 18 cmH20  INTAKE / OUTPUT: I/O last 3 completed shifts: In: 4340.5 [I.V.:3760.5; IV Piggyback:580] Out: 3155 [Urine:3155]  PHYSICAL EXAMINATION: General:  Ill appearing elderly woman, severe kyphosis, currently being pheresis  Neuro:  Some grimace with pain, will not react to voice, no cough, leftward gaze HEENT:  Pooling secretions noted in OP, et to vent Cardiovascular:  Regular, no M Lungs:  Coarse BS B, no wheezes.  Abdomen:  Soft, NT, + BS Musculoskeletal:  No LE edema Skin:   Some tenting, no rash.   LABS:  BMET  Recent Labs Lab 04/06/16 0411 04/06/16 2000 04/07/16 0330  NA 136 141 137  K 2.9* 3.8 3.9  CL 102 107 104  CO2 BUN 8 <5* 5*  CREATININE 1.16* 1.14* 1.21*  GLUCOSE 135* 137* 142*    Electrolytes  Recent Labs Lab 04/06/16 0411 04/06/16 2000 04/06/16 2145 04/07/16 0330  CALCIUM 8.0* 8.6*  --  8.4*  MG 1.3*  --  1.8 1.8  PHOS 2.7  --   --   --     CBC  Recent Labs Lab 04/05/16 2004 04/06/16 0411 04/07/16 0330  WBC 22.4* 23.7* 20.4*  HGB 8.8* 9.0* 8.8*  HCT 26.2* 27.1* 28.0*  PLT 109* 123* 170    Coag's  Recent Labs Lab 04/17/2016 2035 04/06/16 0411  APTT 33 24  INR 1.40 1.27    Sepsis Markers No results for input(s): LATICACIDVEN, PROCALCITON, O2SATVEN in the last 168 hours.  ABG No results for input(s): PHART, PCO2ART, PO2ART in the last 168 hours.  Liver Enzymes  Recent Labs Lab 04/29/2016 0836 04/13/2016 1658 04/06/16 0411  AST 56* 67* 40  ALT ALKPHOS 53 52 51  BILITOT 3.0* 2.5* 2.1*  ALBUMIN 3.8 3.3* 2.7*    Cardiac Enzymes  Recent Labs Lab 04/05/16 0524 04/06/16 2145 04/07/16 0330  TROPONINI 3.90* 4.41* 5.13*    Glucose No results for input(s): GLUCAP in the last 168 hours.  Imaging Dg Chest Port 1 View  04/07/2016  CLINICAL DATA:  Respiratory failure EXAM: PORTABLE CHEST 1 VIEW COMPARISON:  04/06/2016 FINDINGS: Tubular device is stable. Low volumes. Bibasilar atelectasis. Small right pleural effusion is suspected. The left pleural effusion suspected previously is not appreciated on today's study. Vascular congestion without interstitial edema. IMPRESSION: Bibasilar atelectasis and vascular congestion are not significantly changed. Small right pleural effusion. Electronically Signed   By: Jolaine ClickArthur  Hoss M.D.   On: 04/07/2016 07:36   Dg Abd Portable 1v  04/07/2016  CLINICAL DATA:  OG tube placement today. EXAM: PORTABLE ABDOMEN - 1 VIEW COMPARISON:  None. FINDINGS: OG tube is  in place with the tip in the distal stomach. Bowel gas pattern is unremarkable. IMPRESSION: OG tube in good position. Electronically Signed   By: Drusilla Kannerhomas  Dalessio M.D.   On: 04/07/2016 10:12     STUDIES:  Head Ct 6/2 >> extensive chronic ischemic changes, new acute / subacute L > R cerebellar infarcts, no mass effect or bleed  CT chest 6/2 >> no dissection, no PE, emphysematous changes CT abdomen/ pelvis 6/2 >> cholelithiasis without evidence obstruction, no other acute findings.  6/3 MRI Infarct left cerebellum, pons infarct, multiple embolic infarcts, rt vertebral artery occlusion   CULTURES: None  ANTIBIOTICS: None  SIGNIFICANT EVENTS: Intubated 6/2 for airway protection 6/3 plasma phoresis  6/3 PRBC transfusion   LINES/TUBES: ETT 6/2 >>  6/2 rt i j hd cath>>  DISCUSSION: 75 yo woman with HTN, mild dementia, hx CVA's admitted 6/2 with acute altered MS. Eval has revealed B cerebellar CVA's, mild acute renal failure, severe thrombocytopenia w schistocytes, NSTEMI. Suspect cerebellar and cardiac ischemia due to microvascular disease and pro-thrombotic state as opposed to macroembolism. Entire picture consistent w TTP, appears to be responding to plasmaphoresis. MRI confirms cerebellar CVA's, predicts poor prognosis  ASSESSMENT / PLAN:  PULMONARY A: Acute respiratory failure due to inability to protect airway Presumed hx COPD given emphysema on Ct chest P:   MV for airway protection  Albuterol prn  CARDIOVASCULAR A:  Elevated troponin I, ? Due to microvascular thrombosis. Peaked at 5 and decreased to 3.9 on 6/3. Note focal wall motion abnormalities on TTE > hypokinesis of the apicalanteroseptal, anterior, and apical myocardium. Hx hypertension Atrial fib P:  Continue amiodarone gtt Hold anti-HTN regimen ? Whether we can / should start heparin at some point; will defer this decision to hematology Consider cardiology eval of NSTEMI if she improves neuro status. Otherwise  would defer any invasive workup  RENAL Lab Results  Component Value Date   CREATININE 1.21* 04/07/2016   CREATININE 1.14* 04/06/2016   CREATININE 1.16* 04/06/2016   CREATININE 1.27 12/01/2013   CREATININE 1.11 08/11/2012    Recent Labs Lab 04/06/16 0411 04/06/16 2000 04/07/16 0330  K 2.9* 3.8 3.9    A:   Acute renal failure, note S Cr 1.06 in 02/2016; improved P:   Follow BMP, electrolytes Foley to manage I/O's  GASTROINTESTINAL A:   Reported hx H pylori SUP P:   pepcid   HEMATOLOGIC A:    Recent Labs  04/06/16 0411 04/07/16 0330  HGB 9.0* 8.8*    Severe thrombocytopenia, etiology unclear but note episode of thrombocytopenia during her hosp in 3/'17 as well, consider drug induced (amoxicillin/clarithromycin). Suspect TTP now given response to plasmaphoresis Mild normocytic anemia P:  Hematology consulted.  Plasma phoresis 2 of 3 completed Transfuse for hgb 7.0  INFECTIOUS A:   No clear source infxn at this time.  P:   Follow clinically, no abx for now Note possible rxn to amoxicillin or clarithromycin   ENDOCRINE A:   Hyperlipidemia    P:   Statin has been on hold for last few days (since her outpt abx were started)  NEUROLOGIC A:   Acute cerebellar CVA's, ? Possible contribution of thrombocytopenia and pro-thrombotic state Probable seizure activity noted 6/2 pm P:   RASS goal: -1 Ativan given x 1, keppra ordered 6/2 Sedation per PAD protocol  Hold plavix for now; no ASA or heparin until cleared to do so by hematology EEG and MRI/A brain shows massive injuries, no hope of any meaningful recovery. Carotid US   FAMILY  - Updates: Dr. Delton Coombes on 6/2  reviewed clinical status, organ injuries and prognosis with pt's sister and brother at bedside 6/2. Explained that we are  unable at this time to explain all the findings, but that her age and prior CVa's make her prognosis for meaningful survival poor. They want her supported fully until we can  determine the severity of her Acute CVA's, the potential for recovery.    - Inter-disciplinary family meet or Palliative Care meeting due by:  04/11/16  Independent CC time 34 minutes   Levy Pupa, MD, PhD 04/07/2016, 10:55 AM [facility] [phone] or if no answer [phone]

## 2016-04-07 NOTE — Progress Notes (Signed)
eLink Physician-Brief Progress Note Patient Name: Laura Levy DOB: 10-09-1941 MRN: [id number]   Date of Service  04/07/2016  HPI/Events of Note  Notified of need for stress ulcer prophylaxis. Progress notes states patient is on Pepcid and no Pepcid is ordered.  eICU Interventions  Will order Pepcid IV.     Intervention Category Intermediate Interventions: Best-practice therapies (e.g. DVT, beta blocker, etc.)  Zelena Bushong Eugene 04/07/2016, 9:34 PM

## 2016-04-08 DIAGNOSIS — I480 Paroxysmal atrial fibrillation: Secondary | ICD-10-CM

## 2016-04-08 DIAGNOSIS — R569 Unspecified convulsions: Secondary | ICD-10-CM

## 2016-04-08 LAB — CBC
HCT: 26.6 % — ABNORMAL LOW (ref 36.0–46.0)
HEMOGLOBIN: 8.5 g/dL — AB (ref 12.0–15.0)
MCH: 28.8 pg (ref 26.0–34.0)
MCHC: 32 g/dL (ref 30.0–36.0)
MCV: 90.2 fL (ref 78.0–100.0)
Platelets: 239 10*3/uL (ref 150–400)
RBC: 2.95 MIL/uL — ABNORMAL LOW (ref 3.87–5.11)
RDW: 15.6 % — AB (ref 11.5–15.5)
WBC: 18.2 10*3/uL — ABNORMAL HIGH (ref 4.0–10.5)

## 2016-04-08 LAB — THERAPEUTIC PLASMA EXCHANGE (BLOOD BANK)
Plasma Exchange: 2500
Plasma volume needed: 2500
UNIT DIVISION: 0
UNIT DIVISION: 0
UNIT DIVISION: 0
UNIT DIVISION: 0
UNIT DIVISION: 0
UNIT DIVISION: 0
UNIT DIVISION: 0
Unit division: 0

## 2016-04-08 LAB — GLUCOSE, CAPILLARY
GLUCOSE-CAPILLARY: 124 mg/dL — AB (ref 65–99)
GLUCOSE-CAPILLARY: 126 mg/dL — AB (ref 65–99)
GLUCOSE-CAPILLARY: 131 mg/dL — AB (ref 65–99)
GLUCOSE-CAPILLARY: 143 mg/dL — AB (ref 65–99)
Glucose-Capillary: 136 mg/dL — ABNORMAL HIGH (ref 65–99)
Glucose-Capillary: 137 mg/dL — ABNORMAL HIGH (ref 65–99)
Glucose-Capillary: 131 mg/dL — ABNORMAL HIGH (ref 65–99)

## 2016-04-08 LAB — BASIC METABOLIC PANEL
Anion gap: 7 (ref 5–15)
BUN: 7 mg/dL (ref 6–20)
CHLORIDE: 106 mmol/L (ref 101–111)
CO2: 28 mmol/L (ref 22–32)
CREATININE: 1.19 mg/dL — AB (ref 0.44–1.00)
Calcium: 8.4 mg/dL — ABNORMAL LOW (ref 8.9–10.3)
GFR, EST AFRICAN AMERICAN: 52 mL/min — AB (ref >60)
GFR, EST NON AFRICAN AMERICAN: 44 mL/min — AB (ref >60)
Glucose, Bld: 139 mg/dL — ABNORMAL HIGH (ref 65–99)
POTASSIUM: 3.2 mmol/L — AB (ref 3.5–5.1)
SODIUM: 141 mmol/L (ref 135–145)

## 2016-04-08 LAB — MAGNESIUM
MAGNESIUM: 1.8 mg/dL (ref 1.7–2.4)
Magnesium: 1.7 mg/dL (ref 1.7–2.4)

## 2016-04-08 LAB — PHOSPHORUS
Phosphorus: 3.7 mg/dL (ref 2.5–4.6)
Phosphorus: 4.3 mg/dL (ref 2.5–4.6)

## 2016-04-08 LAB — TROPONIN I: TROPONIN I: 3.67 ng/mL — AB (ref <0.031)

## 2016-04-08 MED ORDER — ASPIRIN 325 MG PO TABS
325.0000 mg | ORAL_TABLET | Freq: Every day | ORAL | Status: DC
  Administered 2016-04-08 – 2016-04-13 (×6): 325 mg
  Filled 2016-04-08 (×6): qty 1

## 2016-04-08 MED ORDER — POTASSIUM CHLORIDE 20 MEQ/15ML (10%) PO SOLN
40.0000 meq | Freq: Once | ORAL | Status: AC
  Administered 2016-04-08: 40 meq
  Filled 2016-04-08: qty 30

## 2016-04-08 MED ORDER — NOREPINEPHRINE BITARTRATE 1 MG/ML IV SOLN
0.0000 ug/min | INTRAVENOUS | Status: DC
  Administered 2016-04-08: 16 ug/min via INTRAVENOUS
  Administered 2016-04-08: 15 ug/min via INTRAVENOUS
  Administered 2016-04-09: 18 ug/min via INTRAVENOUS
  Administered 2016-04-11: 10 ug/min via INTRAVENOUS
  Filled 2016-04-08 (×6): qty 16

## 2016-04-08 MED ORDER — POTASSIUM CHLORIDE 20 MEQ PO PACK: 40.0000 meq | PACK | Freq: Once | ORAL | Status: DC

## 2016-04-08 NOTE — Progress Notes (Signed)
OT Cancellation Note  Patient Details Name: Laura Levy MRN: [id number] DOB: 1941-04-09   Cancelled Treatment:    Reason Eval/Treat Not Completed: Patient not medically ready (intubated and sedated) OT to hold evaluation until more appropriate time when sedation reduced.  Felecia ShellingJones, Alexsis Branscom B   Yael Coppess, Brynn   OTR/L Pager: [phone] Office: [phone] .  04/08/2016, 7:05 AM

## 2016-04-08 NOTE — Progress Notes (Signed)
PT Cancellation Note  Patient Details Name: Montine CircleVera M Liberto MRN: [id number] DOB: July 15, 1941   Cancelled Treatment:    Reason Eval/Treat Not Completed: Medical issues which prohibited therapy remains sedated on vent   [name], Belford Pascucci J 04/08/2016, 7:14 AM Charlotte Crumbevon Momin Misko, PT DPT  [phone]

## 2016-04-08 NOTE — Progress Notes (Signed)
STROKE TEAM PROGRESS NOTE   SUBJECTIVE (INTERVAL HISTORY) One family members present at bedside. The patient is still intubated and not following commands, but seems more awake today than yesterday. Had family meeting yesterday with CCM and would like aggressive care at this time.    OBJECTIVE Temp:  [97.4 F (36.3 C)-98.8 F (37.1 C)] 98 F (36.7 C) (06/06 1600) Pulse Rate:  [66-134] 134 (06/06 1530) Cardiac Rhythm:  [-] Normal sinus rhythm;Sinus tachycardia (06/06 0800) Resp:  [0-30] 30 (06/06 1530) BP: (95-148)/(44-80) 148/80 mmHg (06/06 1530) SpO2:  [100 %] 100 % (06/06 1530) FiO2 (%):  [40 %] 40 % (06/06 1530) Weight:  [148 lb 9.4 oz (67.4 kg)] 148 lb 9.4 oz (67.4 kg) (06/06 0325)  CBC:   Recent Labs Lab 04/06/16 0411 04/07/16 0330 04/07/16 0821 04/08/16 0306  WBC 23.7* 20.4*  --  18.2*  NEUTROABS 18.3* 16.3*  --   --   HGB 9.0* 8.8* 9.2* 8.5*  HCT 27.1* 28.0* 27.0* 26.6*  MCV 86.9 90.6  --  90.2  PLT 123* 170  --  239    Basic Metabolic Panel:   Recent Labs Lab 04/07/16 0330 04/07/16 0821  04/08/16 0306 04/08/16 0518 04/08/16 1657  NA 137 140  --  141  --   --   K 3.9 4.1  --  3.2*  --   --   CL 104 99*  --  106  --   --   CO2 28  --   --  28  --   --   GLUCOSE 142* 135*  --  139*  --   --   BUN 5* 6  --  7  --   --   CREATININE 1.21* 1.10*  --  1.19*  --   --   CALCIUM 8.4*  --   --  8.4*  --   --   MG 1.8  --   < >  --  1.8 1.7  PHOS  --   --   < >  --  3.7 4.3  < > = values in this interval not displayed.  Lipid Panel:     Component Value Date/Time   CHOL 120 04/05/2016 0524   CHOL 116 08/13/2012 0424   TRIG 57 04/05/2016 0524   TRIG 47 08/13/2012 0424   HDL 37* 04/05/2016 0524   HDL 45 08/13/2012 0424   CHOLHDL 3.2 04/05/2016 0524   VLDL 11 04/05/2016 0524   VLDL 9 08/13/2012 0424   LDLCALC 72 04/05/2016 0524   LDLCALC 62 08/13/2012 0424   HgbA1c:  Lab Results  Component Value Date   HGBA1C 4.6* 04/05/2016   Urine Drug Screen: No  results found for: LABOPIA, COCAINSCRNUR, LABBENZ, AMPHETMU, THCU, LABBARB    IMAGING I have personally reviewed the radiological images below and agree with the radiology interpretations.  Mr [name] Head Wo Contrast 04/05/2016   1. 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  2. Two small acute nonhemorrhagic infarcts within the pons.  3. Multi focal punctate areas of acute nonhemorrhagic infarction over the convexities bilaterally, more prominent on the right. This appears to be in a watershed distribution. Embolic infarcts are considered less likely.  4. More focal acute nonhemorrhagic infarct involving the left precentral gyrus measuring 15 mm along the primary motor cortex.  5. Multiple remote lacunar infarcts of the cerebellum bilaterally.  6. Extensive areas of encephalomalacia involving the frontal lobes bilaterally.  7. The right vertebral artery  is occluded.  8. Mild moderate narrowing in the mid basilar artery.  9. Fetal type posterior cerebral arteries bilaterally.  10. The proximal anterior circulation is intact.  11. Moderate distal small vessel disease in the MCA branch vessels bilaterally, worse on the right. The pattern suggests the possibility of a vasculitis.  12. No significant flow disturbance at either carotid bifurcation.  13. Flow is antegrade in the vertebral arteries bilaterally. The left vertebral artery is dominant.   Transthoracic echocardiogram 04/05/2016 Study Conclusions - Left ventricle: The cavity size was normal. There was mild focal  basal hypertrophy of the septum. Systolic function was normal.  The estimated ejection fraction was in the range of 50% to 55%.  Probable hypokinesis of the apicalanteroseptal, anterior, and  apical myocardium. Doppler parameters are consistent with  abnormal left ventricular relaxation (grade 1 diastolic  dysfunction). - Mitral valve: Calcified annulus. There was mild to  moderate  regurgitation. - Tricuspid valve: There was mild-moderate regurgitation directed  centrally. - Pulmonary arteries: Systolic pressure was mildly increased. PA  peak pressure: 34 mm Hg (S).  EEG 04/06/2016 Impression:  Abnormal routine inpatient EEG suggestive of at least moderate encephalopathy as described. Clinical correlation is recommended .   CUS - pending due to central line in the neck    PHYSICAL EXAM Frail cachectic elderly African-American lady intubated. On ventilatory support. Afebrile. Head is nontraumatic. Neck is supple with central line placement. Cardiac exam RRR.   Neurological Exam :  Patient is intubated. Eyes are open at middle position. She does not blink to threat on either side. Patient does not follow any commands. Pupils irregular 3 mm but reactive. Doll's eye movements are sluggish. Fundi could not be visualized. Tongue is midline. Motor system exam reveals spastic right hemiparesis with increased tone with fixed flexion contracture of the fingers and the wrist on the right. Patient has significant paratonia on the left and will resist moments. She does withdraw to pain slightly more on the left LE compared to the right LE. Both plantars are upgoing. Gait cannot be tested.    ASSESSMENT/PLAN Ms. Montine CircleVera M Goetzinger is a 75 y.o. female with history of hypertension, previous strokes, and dementia, presenting obtunded. She did not receive IV t-PA due to late presentation.  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   Resultant - intubated and AMS. 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 Fetal PCA bilaterally.  Carotid Doppler - pending  2D Echo - EF 50-55%. No cardiac source of emboli identified.  EEG - moderate encephalopathy.  LDL - 72  HgbA1c 4.6  VTE prophylaxis - SCDs Diet  NPO time specified  aspirin 81 mg daily and clopidogrel 75 mg daily prior to admission, now on ASA 81mg   Ongoing aggressive stroke risk factor management  Therapy recommendations: Pending  Disposition: Pending  TTP  Typical presentation with AMS, vomiting, abdominal pain, thrombocytopenia   Platelet 13->14->128->123->170->239  Respond to plasmapheresis well   Peripheral blood smear confirmed schistocytes  Hematology on board  New diagnosed afib  afib was recorded on tele  Currently on amiodarone drip  ASA restarted  Hematology OK with heparin use if needed  If pt improves well, anticoagulation would be considered  History of stroke  Residue of right spastic hemiparesis  Vascular dementia  Walk with cane at home  Need assistance with  ADLs   ? Seizure  Status epilepticus suspected at time of admission  EEG no seizure  continue keppra 1000mg  bid  Hypertension  Blood pressure tends to run somewhat low Permissive hypertension (OK if < 220/120) but gradually normalize in 5-7 days  Hyperlipidemia  Home meds:  Lipitor 80 mg daily prior to admission  LDL 72, goal < 70  Avoid lipitor this time due to TTP  Other Stroke Risk Factors  Advanced age  Hx stroke/TIA  Other Active Problems  Anemia  Leukocytosis   Hypokalemia -  supplemented  Plavix allergy listed - potential cause for TTP - avoid plavix use  Hospital day # 4  This patient is critically ill due to embolic stroke, TTP, anemia, seizure and at significant risk of neurological worsening, death form recurrent stroke, bleeding, status epilepticus. This patient's care requires constant monitoring of vital signs, hemodynamics, respiratory and cardiac monitoring, review of multiple databases, neurological assessment, discussion with family, other specialists and medical decision making of high complexity. I spent 35 minutes of neurocritical care time in the care of this patient.  Marvel Plan,  MD PhD Stroke Neurology 04/08/2016 6:45 PM  To contact Stroke Continuity provider, please refer to [email or web address]. After hours, contact General Neurology

## 2016-04-08 NOTE — Progress Notes (Signed)
PULMONARY / CRITICAL CARE MEDICINE   Name: DEANDREA VANPELT MRN: [id number] DOB: 11/20/1940    ADMISSION DATE:  04-26-2016 CONSULTATION DATE:  04/26/16   REFERRING MD:  Dr Elvera Lennox  CHIEF COMPLAINT:  Altermed MS and thrombocytopenia   HISTORY OF PRESENT ILLNESS:   Hx is obtained from notes and family, pt unable to give hx  75 y.o. female with medical history significant of hypertension, prior strokes, dementia, presented to [facility] emergency room 6/2 with chief complaint of altered mental status, slurred speech beginning the evening prior. CT scan showed subacute versus acute stroke bilateral cerebellum. 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 She transferred to [facility], has experienced progressive decline in wakefulness and airway protection. She was then noted to have rhythmic movements consistent w seizure activity, better after ativan. She moved to ICU and CCM  consulted to assist with her care.    SUBJECTIVE:  No new issues reported  VITAL SIGNS: BP 99/58 mmHg  Pulse 93  Temp(Src) 97.4 F (36.3 C) (Axillary)  Resp 15  Ht 5\' 3"  (1.6 m)  Wt 67.4 kg (148 lb 9.4 oz)  BMI 26.33 kg/m2  SpO2 100%  HEMODYNAMICS:    VENTILATOR SETTINGS: Vent Mode:  [-] PRVC FiO2 (%):  [40 %] 40 % Set Rate:  [18 bmp] 18 bmp Vt Set:  [420 mL] 420 mL PEEP:  [5 cmH20] 5 cmH20 Plateau Pressure:  [16 cmH20-19 cmH20] 19 cmH20  INTAKE / OUTPUT: I/O last 3 completed shifts: In: 5048 [I.V.:3612.1; Other:40; NG/GT:815.8; IV Piggyback:580] Out: 4175 [Urine:4175]  PHYSICAL EXAMINATION: General:  Ill appearing elderly woman, severe kyphosis, currently being pheresis  Neuro:  Some grimace with pain, will not react to voice, no cough, leftward gaze HEENT:  Pooling secretions noted in OP, et to vent Cardiovascular:  Regular, no M Lungs:  Coarse BS B, no wheezes.  Abdomen:  Soft, NT, + BS Musculoskeletal:  No LE edema Skin:   Some tenting, no rash.   LABS:  BMET  Recent Labs Lab 04/06/16 2000 04/07/16 0330 04/07/16 0821 04/08/16 0306  NA 141 137 140 141  K 3.8 3.9 4.1 3.2*  CL 107 104 99* 106  CO2 30 28  --  28  BUN <5* 5* 6 7  CREATININE 1.14* 1.21* 1.10* 1.19*  GLUCOSE 137* 142* 135* 139*    Electrolytes  Recent Labs Lab 04/06/16 0411 04/06/16 2000  04/07/16 0330 04/07/16 1545 04/08/16 0306 04/08/16 0518  CALCIUM 8.0* 8.6*  --  8.4*  --  8.4*  --   MG 1.3*  --   < > 1.8 1.7  --  1.8  PHOS 2.7  --   --   --  3.0  --  3.7  < > = values in this interval not displayed.  CBC  Recent Labs Lab 04/06/16 0411 04/07/16 0330 04/07/16 0821 04/08/16 0306  WBC 23.7* 20.4*  --  18.2*  HGB 9.0* 8.8* 9.2* 8.5*  HCT 27.1* 28.0* 27.0* 26.6*  PLT 123* 170  --  239    Coag's  Recent Labs Lab 2016/04/26 2035 04/06/16 0411  APTT 33 24  INR 1.40 1.27    Sepsis Markers No results for input(s): LATICACIDVEN, PROCALCITON, O2SATVEN in the last 168 hours.  ABG No results for input(s): PHART, PCO2ART, PO2ART in the last 168 hours.  Liver Enzymes  Recent Labs Lab 04-26-16 0836 2016-04-26 1658 04/06/16 0411  AST 56* 67* 40  ALT 20  23 19  ALKPHOS 53 52 51  BILITOT 3.0* 2.5* 2.1*  ALBUMIN 3.8 3.3* 2.7*    Cardiac Enzymes  Recent Labs Lab 04/07/16 1545 04/07/16 2128 04/08/16 0306  TROPONINI 3.23* 3.70* 3.67*    Glucose  Recent Labs Lab 04/07/16 1947 04/07/16 2347 04/08/16 0339 04/08/16 0807  GLUCAP 127* 131* 126* 136*    Imaging Dg Abd Portable 1v  04/07/2016  CLINICAL DATA:  OG tube placement today. EXAM: PORTABLE ABDOMEN - 1 VIEW COMPARISON:  None. FINDINGS: OG tube is in place with the tip in the distal stomach. Bowel gas pattern is unremarkable. IMPRESSION: OG tube in good position. Electronically Signed   By: Drusilla Kannerhomas  Dalessio M.D.   On: 04/07/2016 10:12     STUDIES:  Head Ct 6/2 >> extensive chronic ischemic changes, new acute / subacute L > R cerebellar infarcts,  no mass effect or bleed  CT chest 6/2 >> no dissection, no PE, emphysematous changes CT abdomen/ pelvis 6/2 >> cholelithiasis without evidence obstruction, no other acute findings.  6/3 MRI Infarct left cerebellum, pons infarct, multiple embolic infarcts, rt vertebral artery occlusion   CULTURES: None  ANTIBIOTICS: None  SIGNIFICANT EVENTS: Intubated 6/2 for airway protection 6/3 plasma phoresis  6/3 PRBC transfusion   LINES/TUBES: ETT 6/2 >>  6/2 rt i j hd cath>>  DISCUSSION: 75 yo woman with HTN, mild dementia, hx CVA's admitted 6/2 with acute altered MS. Eval has revealed B cerebellar CVA's, mild acute renal failure, severe thrombocytopenia w schistocytes, NSTEMI. Suspect cerebellar and cardiac ischemia due to microvascular disease and pro-thrombotic state as opposed to macroembolism. Entire picture consistent w TTP, appears to be responding to plasmaphoresis. MRI confirms cerebellar CVA's, predicts poor prognosis  ASSESSMENT / PLAN:  PULMONARY A: Acute respiratory failure due to inability to protect airway Presumed hx COPD given emphysema on Ct chest P:   MV for airway protection  Albuterol prn  CARDIOVASCULAR A:  Elevated troponin I, ? Due to microvascular thrombosis. Peaked at 5 and decreased to 3.9 on 6/3. Note focal wall motion abnormalities on TTE > hypokinesis of the apicalanteroseptal, anterior, and apical myocardium. Hx hypertension Atrial fib P:  Continue amiodarone gtt, possible transition to per tube soon or off Hold anti-HTN regimen Appreciate hematology recs > OK to restart ASA or even heparin if indicated but would NOT restart plavix given its association w TTP. ASA restarted on 6/6 per tube Consider cardiology eval of NSTEMI if she improves neuro status. Otherwise would defer any invasive workup  RENAL Lab Results  Component Value Date   CREATININE 1.19* 04/08/2016   CREATININE 1.10* 04/07/2016   CREATININE 1.21* 04/07/2016   CREATININE 1.27  12/01/2013   CREATININE 1.11 08/11/2012    Recent Labs Lab 04/07/16 0330 04/07/16 0821 04/08/16 0306  K 3.9 4.1 3.2*    A:   Acute renal failure, note S Cr 1.06 in 02/2016; improved P:   Follow BMP, electrolytes Foley to manage I/O's  GASTROINTESTINAL A:   Reported hx H pylori SUP P:   pepcid   HEMATOLOGIC A:    Recent Labs  04/07/16 0821 04/08/16 0306  HGB 9.2* 8.5*    Severe thrombocytopenia, etiology unclear but note episode of thrombocytopenia during her hosp in 3/'17 as well, consider drug induced (amoxicillin/clarithromycin). Suspect TTP now given response to plasmaphoresis Mild normocytic anemia P:  Hematology assistance appreciated Plasma phoresis 3 of 3 completed Transfuse for hgb 7.0  INFECTIOUS A:   No clear source infxn at this time.  P:  Follow clinically, no abx for now Note possible rxn to amoxicillin or clarithromycin   ENDOCRINE A:   Hyperlipidemia    P:   Statin has been on hold for last few days (since her outpt abx were started)  NEUROLOGIC A:   Acute cerebellar CVA's, ? Possible contribution of thrombocytopenia and pro-thrombotic state Probable seizure activity noted 6/2 pm P:   RASS goal: -1 Ativan given x 1, keppra ordered 6/2 Sedation per PAD protocol  ASA restarted 6/6 EEG and MRI/A brain show significant injuries, poor prognosis for meaningful recovery Carotid US   FAMILY  - Updates: Dr. Delton Coombes on 6/2 and 6/5,  reviewed clinical status, organ injuries and prognosis with pt's sister and brother at bedside. Explained that her age, TTP and prior CVa's make her prognosis for meaningful survival poor. They want her supported fully until we can determine the severity of her Acute CVA's, the potential for recovery.    - Inter-disciplinary family meet or Palliative Care meeting due by:  04/11/16  Independent CC time 32 minutes   Levy Pupa, MD, PhD 04/08/2016, 9:34 AM [facility] [phone] or if no  answer [phone]

## 2016-04-09 DIAGNOSIS — D6489 Other specified anemias: Secondary | ICD-10-CM

## 2016-04-09 LAB — GLUCOSE, CAPILLARY
GLUCOSE-CAPILLARY: 124 mg/dL — AB (ref 65–99)
GLUCOSE-CAPILLARY: 129 mg/dL — AB (ref 65–99)
GLUCOSE-CAPILLARY: 132 mg/dL — AB (ref 65–99)
GLUCOSE-CAPILLARY: 135 mg/dL — AB (ref 65–99)
GLUCOSE-CAPILLARY: 147 mg/dL — AB (ref 65–99)
Glucose-Capillary: 115 mg/dL — ABNORMAL HIGH (ref 65–99)

## 2016-04-09 LAB — BASIC METABOLIC PANEL
Anion gap: 7 (ref 5–15)
BUN: 12 mg/dL (ref 6–20)
CALCIUM: 8 mg/dL — AB (ref 8.9–10.3)
CHLORIDE: 107 mmol/L (ref 101–111)
CO2: 26 mmol/L (ref 22–32)
CREATININE: 1.19 mg/dL — AB (ref 0.44–1.00)
GFR calc Af Amer: 52 mL/min — ABNORMAL LOW (ref >60)
GFR calc non Af Amer: 44 mL/min — ABNORMAL LOW (ref >60)
Glucose, Bld: 113 mg/dL — ABNORMAL HIGH (ref 65–99)
Potassium: 4.3 mmol/L (ref 3.5–5.1)
Sodium: 140 mmol/L (ref 135–145)

## 2016-04-09 LAB — URINE MICROSCOPIC-ADD ON

## 2016-04-09 LAB — URINALYSIS, ROUTINE W REFLEX MICROSCOPIC
Bilirubin Urine: NEGATIVE
GLUCOSE, UA: NEGATIVE mg/dL
KETONES UR: NEGATIVE mg/dL
Nitrite: POSITIVE — AB
PH: 5 (ref 5.0–8.0)
Protein, ur: 100 mg/dL — AB
Specific Gravity, Urine: 1.025 (ref 1.005–1.030)

## 2016-04-09 LAB — SODIUM, URINE, RANDOM: Sodium, Ur: 64 mmol/L

## 2016-04-09 LAB — MRSA PCR SCREENING: MRSA by PCR: NEGATIVE

## 2016-04-09 LAB — OSMOLALITY, URINE: OSMOLALITY UR: 555 mosm/kg (ref 300–900)

## 2016-04-09 LAB — MAGNESIUM: MAGNESIUM: 1.7 mg/dL (ref 1.7–2.4)

## 2016-04-09 LAB — PHOSPHORUS: Phosphorus: 3.6 mg/dL (ref 2.5–4.6)

## 2016-04-09 MED ORDER — PROPOFOL 500 MG/50ML IV EMUL
5.0000 ug/kg/min | INTRAVENOUS | Status: DC
  Administered 2016-04-10: 40 ug/kg/min via INTRAVENOUS
  Administered 2016-04-10: 30 ug/kg/min via INTRAVENOUS

## 2016-04-09 MED ORDER — MIDAZOLAM HCL 2 MG/2ML IJ SOLN
4.0000 mg | Freq: Once | INTRAMUSCULAR | Status: AC
  Administered 2016-04-10: 2 mg via INTRAVENOUS
  Filled 2016-04-09: qty 4

## 2016-04-09 MED ORDER — VECURONIUM BROMIDE 10 MG IV SOLR
10.0000 mg | Freq: Once | INTRAVENOUS | Status: AC
  Administered 2016-04-10: 10 mg via INTRAVENOUS

## 2016-04-09 MED ORDER — MAGNESIUM SULFATE 2 GM/50ML IV SOLN
2.0000 g | Freq: Once | INTRAVENOUS | Status: AC
  Administered 2016-04-09: 2 g via INTRAVENOUS
  Filled 2016-04-09: qty 50

## 2016-04-09 MED ORDER — FENTANYL CITRATE (PF) 100 MCG/2ML IJ SOLN: 200.0000 ug | Freq: Once | INTRAMUSCULAR | Status: DC

## 2016-04-09 MED ORDER — SODIUM CHLORIDE 0.9 % IV BOLUS (SEPSIS)
500.0000 mL | Freq: Once | INTRAVENOUS | Status: AC
  Administered 2016-04-09: 500 mL via INTRAVENOUS

## 2016-04-09 MED ORDER — ETOMIDATE 2 MG/ML IV SOLN
40.0000 mg | Freq: Once | INTRAVENOUS | Status: AC
  Administered 2016-04-10: 20 mg via INTRAVENOUS

## 2016-04-09 MED ORDER — DEXTROSE 5 % IV SOLN
1.0000 g | INTRAVENOUS | Status: DC
  Administered 2016-04-09 – 2016-04-10 (×2): 1 g via INTRAVENOUS
  Filled 2016-04-09 (×3): qty 10

## 2016-04-09 NOTE — Progress Notes (Signed)
Patient going in and out of AFib. HR irregular in the 140s. ELink notified. [facility]. Ripley FraiseHarper, Jewelz Ricklefs D

## 2016-04-09 NOTE — Progress Notes (Signed)
STROKE TEAM PROGRESS NOTE   SUBJECTIVE (INTERVAL HISTORY) Pt son at bedside. He wants to continue aggressive therapy. The patient is still intubated and not following commands, but keep eyes opne. Still in afib rhythm, HR 110s.     OBJECTIVE Temp:  [98.2 F (36.8 C)-99.6 F (37.6 C)] 98.2 F (36.8 C) (06/07 1600) Pulse Rate:  [66-127] 88 (06/07 1700) Cardiac Rhythm:  [-] Normal sinus rhythm (06/07 1600) Resp:  [13-19] 17 (06/07 1700) BP: (88-148)/(47-75) 119/75 mmHg (06/07 1700) SpO2:  [98 %-100 %] 100 % (06/07 1700) FiO2 (%):  [40 %] 40 % (06/07 1700) Weight:  [157 lb 3 oz (71.3 kg)] 157 lb 3 oz (71.3 kg) (06/07 0430)  CBC:   Recent Labs Lab 04/06/16 0411 04/07/16 0330 04/07/16 0821 04/08/16 0306  WBC 23.7* 20.4*  --  18.2*  NEUTROABS 18.3* 16.3*  --   --   HGB 9.0* 8.8* 9.2* 8.5*  HCT 27.1* 28.0* 27.0* 26.6*  MCV 86.9 90.6  --  90.2  PLT 123* 170  --  239    Basic Metabolic Panel:   Recent Labs Lab 04/08/16 0306  04/08/16 1657 04/09/16 0521  NA 141  --   --  140  K 3.2*  --   --  4.3  CL 106  --   --  107  CO2 28  --   --  26  GLUCOSE 139*  --   --  113*  BUN 7  --   --  12  CREATININE 1.19*  --   --  1.19*  CALCIUM 8.4*  --   --  8.0*  MG  --   < > 1.7 1.7  PHOS  --   < > 4.3 3.6  < > = values in this interval not displayed.  Lipid Panel:     Component Value Date/Time   CHOL 120 04/05/2016 0524   CHOL 116 08/13/2012 0424   TRIG 57 04/05/2016 0524   TRIG 47 08/13/2012 0424   HDL 37* 04/05/2016 0524   HDL 45 08/13/2012 0424   CHOLHDL 3.2 04/05/2016 0524   VLDL 11 04/05/2016 0524   VLDL 9 08/13/2012 0424   LDLCALC 72 04/05/2016 0524   LDLCALC 62 08/13/2012 0424   HgbA1c:  Lab Results  Component Value Date   HGBA1C 4.6* 04/05/2016   Urine Drug Screen: No results found for: LABOPIA, COCAINSCRNUR, LABBENZ, AMPHETMU, THCU, LABBARB    IMAGING I have personally reviewed the radiological images below and agree with the radiology  interpretations.  Mr Laura Levy Head Wo Contrast 04/05/2016   1. 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  2. Two small acute nonhemorrhagic infarcts within the pons.  3. Multi focal punctate areas of acute nonhemorrhagic infarction over the convexities bilaterally, more prominent on the right. This appears to be in a watershed distribution. Embolic infarcts are considered less likely.  4. More focal acute nonhemorrhagic infarct involving the left precentral gyrus measuring 15 mm along the primary motor cortex.  5. Multiple remote lacunar infarcts of the cerebellum bilaterally.  6. Extensive areas of encephalomalacia involving the frontal lobes bilaterally.  7. 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  8. Mild moderate narrowing in the mid basilar artery.  9. Fetal type posterior cerebral arteries bilaterally.  10. The proximal anterior circulation is intact.  11. Moderate distal small vessel disease in the MCA branch vessels bilaterally, worse on the right. The pattern suggests the possibility of a vasculitis.  12. No  significant flow disturbance at either carotid bifurcation.  13. Flow is antegrade in the vertebral arteries bilaterally. The left vertebral artery is dominant.   Transthoracic echocardiogram 04/05/2016 Study Conclusions - Left ventricle: The cavity size was normal. There was mild focal  basal hypertrophy of the septum. Systolic function was normal.  The estimated ejection fraction was in the range of 50% to 55%.  Probable hypokinesis of the apicalanteroseptal, anterior, and  apical myocardium. Doppler parameters are consistent with  abnormal left ventricular relaxation (grade 1 diastolic  dysfunction). - Mitral valve: Calcified annulus. There was mild to moderate  regurgitation. - Tricuspid valve: There was mild-moderate regurgitation directed  centrally. - Pulmonary arteries: Systolic pressure was mildly increased. PA  peak  pressure: 34 mm Hg (S).  EEG 04/06/2016 Impression:  Abnormal routine inpatient EEG suggestive of at least moderate encephalopathy as described. Clinical correlation is recommended .   CUS - hold off due to central line at neck.     PHYSICAL EXAM Frail cachectic elderly African-American lady intubated. On ventilatory support. Afebrile. Head is nontraumatic. Neck is supple with central line placement. Cardiac exam RRR.   Neurological Exam :  Patient is intubated. Eyes are open at middle position. She does not blink to threat on either side. Patient does not follow any commands. Pupils irregular 3 mm but reactive. Doll's eye movements are sluggish. Fundi could not be visualized. Tongue is midline. Motor system exam reveals spastic right hemiparesis with increased tone with fixed flexion contracture of the fingers and the wrist on the right. Patient has significant paratonia on the left and will resist moments. She does withdraw to pain slightly more on the left LE compared to the right LE. Both plantars are upgoing. Gait cannot be tested.    ASSESSMENT/PLAN Laura Levy is a 75 y.o. female with history of hypertension, previous strokes, and dementia, presenting obtunded. She did not receive IV t-PA due to late presentation.  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   Resultant - intubated and AMS. 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 Fetal PCA bilaterally.  Carotid Doppler - hold off due to central line  2D Echo - EF 50-55%. No cardiac source of emboli identified.  EEG - moderate encephalopathy.  LDL - 72  HgbA1c 4.6  VTE prophylaxis - SCDs Diet NPO time specified  aspirin 81 mg daily and clopidogrel 75 mg daily prior to admission, now on ASA 81mg   Ongoing aggressive stroke risk factor  management  Therapy recommendations: Pending  Disposition: Pending  TTP  Typical presentation with AMS, vomiting, abdominal pain, thrombocytopenia   Platelet 13->14->128->123->170->239  Respond to plasmapheresis well   Peripheral blood smear confirmed schistocytes  Hematology on board  New diagnosed afib  afib was recorded on tele  Currently on amiodarone drip  ASA restarted  Hematology OK with heparin use if needed  If pt improves well, anticoagulation would be considered  History of stroke  Residue of right spastic hemiparesis  Vascular dementia  Walk with cane at home  Need assistance with ADLs   ? Seizure  Status epilepticus suspected at time of admission  EEG no seizure  continue keppra 1000mg  bid  Hypertension  Blood pressure tends to run somewhat low Permissive hypertension (OK if < 220/120) but gradually normalize in 5-7 days  Hyperlipidemia  Home meds:  Lipitor 80 mg daily prior to admission  LDL 72, goal < 70  Avoid lipitor this time due to TTP  Other Stroke Risk Factors  Advanced age  Hx stroke/TIA  Other Active Problems  Anemia  Leukocytosis   Hypokalemia -  supplemented  Plavix allergy listed - potential cause for TTP - avoid plavix use  Hospital day # 5  This patient is critically ill due to embolic stroke, TTP, anemia, seizure and at significant risk of neurological worsening, death form recurrent stroke, bleeding, status epilepticus. This patient's care requires constant monitoring of vital signs, hemodynamics, respiratory and cardiac monitoring, review of multiple databases, neurological assessment, discussion with family, other specialists and medical decision making of high complexity. I had long discussion with son and updated her current condition, treatment plan and prognosis. Son wants to continue aggressive care. I spent 35 minutes of neurocritical care time in the care of this patient.  Marvel Plan, MD  PhD Stroke Neurology 04/09/2016 5:51 PM  To contact Stroke Continuity provider, please refer to [email or web address]. After hours, contact General Neurology

## 2016-04-09 NOTE — Progress Notes (Signed)
Laura Levy   DOB:13-Jul-1941   ZO#:[id number]    Subjective: Patient remained intubated and ventilated. No family members present. Noted concern patient is oliguric  Objective:  Filed Vitals:   04/09/16 0638 04/09/16 0700  BP: 115/52 102/51  Pulse: 81 72  Temp:    Resp: 19 14     Intake/Output Summary (Last 24 hours) at 04/09/16 9811 Last data filed at 04/09/16 0636  Gross per 24 hour  Intake 3456.63 ml  Output    637 ml  Net 2819.63 ml   GENERAL:Sedated and intubated SKIN: skin color, texture, turgor are normal, no rashes or significant lesions EYES: Eyes are open intermittently, pale OROPHARYNX: Intubated  Musculoskeletal:no cyanosis of digits and no clubbing  NEURO: Unable to assess   Labs:  Lab Results  Component Value Date   WBC 18.2* 04/08/2016   HGB 8.5* 04/08/2016   HCT 26.6* 04/08/2016   MCV 90.2 04/08/2016   PLT 239 04/08/2016   NEUTROABS 16.3* 04/07/2016    Lab Results  Component Value Date   NA 140 04/09/2016   K 4.3 04/09/2016   CL 107 04/09/2016   CO2 26 04/09/2016    Studies:  Dg Chest Port 1 View  04/07/2016  CLINICAL DATA:  Respiratory failure EXAM: PORTABLE CHEST 1 VIEW COMPARISON:  04/06/2016 FINDINGS: Tubular device is stable. Low volumes. Bibasilar atelectasis. Small right pleural effusion is suspected. The left pleural effusion suspected previously is not appreciated on today's study. Vascular congestion without interstitial edema. IMPRESSION: Bibasilar atelectasis and vascular congestion are not significantly changed. Small right pleural effusion. Electronically Signed   By: Jolaine Click M.D.   On: 04/07/2016 07:36   Dg Abd Portable 1v  04/07/2016  CLINICAL DATA:  OG tube placement today. EXAM: PORTABLE ABDOMEN - 1 VIEW COMPARISON:  None. FINDINGS: OG tube is in place with the tip in the distal stomach. Bowel gas pattern is unremarkable. IMPRESSION: OG tube in good position. Electronically Signed   By: Drusilla Kanner M.D.   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 The patient has no known liver disease although total bilirubin has been chronically elevated since February 2017, in the absence of concurrent elevation of other liver enzymes Review of peripheral smear is suspicious for TTP and she received emergent plasmapheresis started 04/20/2016 She has responded well to plasmapheresis with improvement of platelet count, total 3 sessions, last on 04/07/16 I recommend holding off plasmapheresis next few days. Will reassess Friday  Acute anemia with elevated bilirubin, suspect hemolysis, status post blood transfusion Her hemoglobin is stable since blood transfusion Plan to transfuse when necessary to keep hemoglobin greater than 7  Mild acute on chronic renal failure, currently oliguric  CT scan show evidence of bilateral renal artery stenosis but no other pathology related to renal obstruction Likely related to recent thrombotic complications from TTP Defer to primary service, consider nephrology consult if appropriate  Altered mental status with recent witnessed seizure Significant bilateral infarct Appreciate neurology consultation She is started on Keppra  Elevated troponin Cause is unknown but acute coronary event on background history of ischemic heart disease is a possibility given history of smoking. TTP can also cause possibility of thrombotic complications With improvement of platelet count, there is no contraindication for her to be placed on heparin or aspirin I highly recommend NOT to put her back on Plavix as it has been described  as a potential cause for TTP She is currently on amiodarone for atrial fibrillation  Goals of care I had a brief discussion with the son on 04/07/16. Even though she had responded to plasmapheresis with improvement of hematological parameters, the damage to her cardiac  function and her brain may not be reversible Now, she may have another complication, namely renal dysfunction I think the son has unrealistic expectation that her mother will improve to the point of her baseline similar to prior admission (Of note, she has baseline dementia with recurrent hospitalization and was residing in nursing prior to admission) Prognosis with TTP is typically very poor without expected meaningful recovery in this case I highly recommend involvement of palliative care consult in the near future with goals of transitioning her care to comfort care and hospice to alleviate further suffering  Discharge planning The patient is very ill. She will remain in ICU for now I will return on Friday to follow  [facility] Hospital[facility]ORSUCH, Jajaira Ruis, MD 04/09/2016  7:12 AM

## 2016-04-09 NOTE — Progress Notes (Signed)
PULMONARY / CRITICAL CARE MEDICINE   Name: Laura Levy MRN: [id number] DOB: 09/29/1941    ADMISSION DATE:  04/03/2016 CONSULTATION DATE:  04/09/2016   REFERRING MD:  Dr Elvera LennoxGherghe  CHIEF COMPLAINT:  Altermed MS and thrombocytopenia   HISTORY OF PRESENT ILLNESS:   Hx is obtained from notes and family, pt unable to give hx  75 y.o. female with medical history significant of hypertension, prior strokes, dementia, presented to [facility] emergency room 6/2 with chief complaint of altered mental status, slurred speech beginning the evening prior. CT scan showed subacute versus acute stroke bilateral cerebellum. 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 [facility], has experienced progressive decline in wakefulness and airway protection. She was then noted to have rhythmic movements consistent w seizure activity, better after ativan. She moved to ICU and CCM  consulted to assist with her care.   SUBJECTIVE:  No new issues reported No changes in MS  VITAL SIGNS: BP 120/57 mmHg  Pulse 78  Temp(Src) 99 F (37.2 C) (Axillary)  Resp 15  Ht 5\' 3"  (1.6 m)  Wt 71.3 kg (157 lb 3 oz)  BMI 27.85 kg/m2  SpO2 100%  HEMODYNAMICS: CVP:  [7 mmHg-8 mmHg] 8 mmHg  VENTILATOR SETTINGS: Vent Mode:  [-] PRVC FiO2 (%):  [40 %] 40 % Set Rate:  [14 bmp] 14 bmp Vt Set:  [420 mL] 420 mL PEEP:  [5 cmH20] 5 cmH20 Plateau Pressure:  [15 cmH20-17 cmH20] 15 cmH20  INTAKE / OUTPUT: I/O last 3 completed shifts: In: 5192.3 [I.V.:1822.3; Other:500; NG/GT:1890; IV Piggyback:980] Out: 1937 [Urine:1937]  PHYSICAL EXAMINATION: General:  Ill appearing elderly woman, severe kyphosis, currently being pheresis  Neuro:  Some grimace with pain, will not react to voice, no cough, leftward gaze HEENT:  Pooling secretions noted in OP, et to vent Cardiovascular:  Regular, no M Lungs:  Coarse BS B, no wheezes.  Abdomen:  Soft, NT, +  BS Musculoskeletal:  No LE edema Skin:  Some tenting, no rash.   LABS:  BMET  Recent Labs Lab 04/07/16 0330 04/07/16 0821 04/08/16 0306 04/09/16 0521  NA 137 140 141 140  K 3.9 4.1 3.2* 4.3  CL 104 99* 106 107  CO2 28  --  28 26  BUN 5* 6 7 12   CREATININE 1.21* 1.10* 1.19* 1.19*  GLUCOSE 142* 135* 139* 113*    Electrolytes  Recent Labs Lab 04/07/16 0330  04/08/16 0306 04/08/16 0518 04/08/16 1657 04/09/16 0521  CALCIUM 8.4*  --  8.4*  --   --  8.0*  MG 1.8  < >  --  1.8 1.7 1.7  PHOS  --   < >  --  3.7 4.3 3.6  < > = values in this interval not displayed.  CBC  Recent Labs Lab 04/06/16 0411 04/07/16 0330 04/07/16 0821 04/08/16 0306  WBC 23.7* 20.4*  --  18.2*  HGB 9.0* 8.8* 9.2* 8.5*  HCT 27.1* 28.0* 27.0* 26.6*  PLT 123* 170  --  239    Coag's  Recent Labs Lab 04/15/2016 2035 04/06/16 0411  APTT 33 24  INR 1.40 1.27    Sepsis Markers No results for input(s): LATICACIDVEN, PROCALCITON, O2SATVEN in the last 168 hours.  ABG No results for input(s): PHART, PCO2ART, PO2ART in the last 168 hours.  Liver Enzymes  Recent Labs Lab 04/05/2016 0836 04/16/2016 1658 04/06/16 0411  AST 56* 67* 40  ALT 20 23  19  ALKPHOS 53 52 51  BILITOT 3.0* 2.5* 2.1*  ALBUMIN 3.8 3.3* 2.7*    Cardiac Enzymes  Recent Labs Lab 04/07/16 1545 04/07/16 2128 04/08/16 0306  TROPONINI 3.23* 3.70* 3.67*    Glucose  Recent Labs Lab 04/08/16 1211 04/08/16 1647 04/08/16 1941 04/08/16 2320 04/09/16 0405 04/09/16 0750  GLUCAP 137* 131* 124* 143* 124* 129*    Imaging No results found.   STUDIES:  Head Ct 6/2 >> extensive chronic ischemic changes, new acute / subacute L > R cerebellar infarcts, no mass effect or bleed  CT chest 6/2 >> no dissection, no PE, emphysematous changes CT abdomen/ pelvis 6/2 >> cholelithiasis without evidence obstruction, no other acute findings.  6/3 MRI Infarct left cerebellum, pons infarct, multiple embolic infarcts, rt  vertebral artery occlusion   CULTURES: None  ANTIBIOTICS: None  SIGNIFICANT EVENTS: Intubated 6/2 for airway protection 6/3 plasma phoresis  6/3 PRBC transfusion   LINES/TUBES: ETT 6/2 >>  6/2 rt i j hd cath>>  DISCUSSION: 75 yo woman with HTN, mild dementia, hx CVA's admitted 6/2 with acute altered MS. Eval has revealed B cerebellar CVA's, mild acute renal failure, severe thrombocytopenia w schistocytes, NSTEMI. Suspect cerebellar and cardiac ischemia due to microvascular disease and pro-thrombotic state as opposed to macroembolism. Entire picture consistent w TTP, appears to be responding to plasmaphoresis. 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 I attempted to be very clear about her poor prognosis for meaningful improvement. May need to also involve Palliative Care as we go forward - I predict that she will not clinically change even w longer support, will need to discuss withdrawal of care.  Albuterol prn  CARDIOVASCULAR A:  Elevated troponin I, ? Due to microvascular thrombosis. Peaked at 5 and decreased to 3.9 on 6/3. Note focal wall motion abnormalities on TTE > hypokinesis of the apicalanteroseptal, anterior, and apical myocardium. Hx hypertension Atrial fib P:  D/c amiodarone 6/7 Hold anti-HTN regimen Appreciate hematology recs > OK to restart ASA or even heparin if indicated but would NOT restart plavix given its association w TTP. ASA restarted on 6/6 per tube Consider cardiology eval of NSTEMI if she improves neuro status. Otherwise would defer any invasive workup  RENAL Lab Results  Component Value Date   CREATININE 1.19* 04/09/2016   CREATININE 1.19* 04/08/2016   CREATININE 1.10*  04/07/2016   CREATININE 1.27 12/01/2013   CREATININE 1.11 08/11/2012    Recent Labs Lab 04/07/16 0821 04/08/16 0306 04/09/16 0521  K 4.1 3.2* 4.3    A:   Acute renal failure, note S Cr 1.06 in 02/2016; improved P:   Follow BMP, electrolytes Foley to manage I/O's  GASTROINTESTINAL A:   Reported hx H pylori SUP P:   pepcid   HEMATOLOGIC A:    Recent Labs  04/07/16 0821 04/08/16 0306  HGB 9.2* 8.5*    Severe thrombocytopenia, etiology unclear but note episode of thrombocytopenia during her hosp in 3/'17 as well, consider drug induced (amoxicillin/clarithromycin). Suspect TTP now given response to plasmaphoresis Mild normocytic anemia P:  Hematology assistance appreciated Plasma phoresis 3 of 3 completed on 6/6 Transfuse for hgb 7.0  INFECTIOUS A:   No clear source infxn at this time.  P:   Follow clinically, no abx for now Note possible rxn to amoxicillin or clarithromycin   ENDOCRINE A:   Hyperlipidemia    P:   Statin has been on hold for last few days (since her outpt abx were started)  NEUROLOGIC A:   Acute cerebellar CVA's, ? Possible contribution of thrombocytopenia and pro-thrombotic state Probable seizure activity noted 6/2 pm P:   RASS goal: -1 Ativan given x 1, keppra ordered 6/2 Sedation per PAD protocol  ASA restarted 6/6 EEG and MRI/A brain show significant injuries, poor prognosis for meaningful recovery Carotid US   FAMILY  - Updates: Dr. Delton Coombes on 6/2, 6/5, 6/7 reviewed clinical status, organ injuries and prognosis with pt's brother at bedside. Explained that her age, TTP and prior CVa's make her prognosis for meaningful survival poor. They want her supported fully until we can determine the severity of her Acute CVA's, the potential for recovery.  Confirmed with me today 6/7 that they would want trach and longer support despite the poor prognosis.    - Inter-disciplinary family meet or Palliative Care meeting due by:   04/11/16  Independent CC time 36 minutes   Levy Pupa, MD, PhD 04/09/2016, 10:25 AM [place] [facility] [phone] or if no answer [phone]

## 2016-04-09 NOTE — Progress Notes (Signed)
Called Dr. Tyson AliasFeinstein with Urine culture/urinalysis results.

## 2016-04-09 NOTE — Progress Notes (Signed)
eLink Physician-Brief Progress Note Patient Name: Laura CircleVera M Mullaly DOB: Aug 20, 1941 MRN: [id number]   Date of Service  04/09/2016  HPI/Events of Note  AFIB with RVR - HR in 110's transiently now back in NSR with a rate = 79. K+ = 4.3, Mg++ = 1.7 and Creatinine = 1.19 this AM.  eICU Interventions  Will replete Mg++.     Intervention Category Major Interventions: Electrolyte abnormality - evaluation and management;Arrhythmia - evaluation and management  Sommer,Steven Eugene 04/09/2016, 4:25 PM

## 2016-04-09 NOTE — Progress Notes (Signed)
OT Cancellation Note  Patient Details Name: Laura CircleVera M Estelle MRN: [id number] DOB: 30-May-1941   Cancelled Treatment:    Reason Eval/Treat Not Completed: Medical issues which prohibited therapy;Patient not medically ready (sedated and intubated).   Gaye AlkenBailey A Abie Killian M.S., OTR/L Pager: [phone]  04/09/2016, 9:38 AM

## 2016-04-09 NOTE — Progress Notes (Signed)
eLink Physician-Brief Progress Note Patient Name: Laura Levy Circlench DOB: [birth year] MRN: [id number]   Date of Service  04/09/2016  HPI/Events of Note  Oliguria - request to continue Foley catheter to monitor urine output.   eICU Interventions  Will continue Foley Catheter.      Intervention Category Intermediate Interventions: Oliguria - evaluation and management  Antia Rahal Eugene 04/09/2016, 7:14 PM

## 2016-04-09 NOTE — Progress Notes (Signed)
eLink Physician-Brief Progress Note Patient Name: Laura Levy DOB: 1941/07/31 MRN: [id number]   Date of Service  04/09/2016  HPI/Events of Note  Request from Dr. Molli KnockYacoub to write pre percutaneous trach order.   eICU Interventions  Pre percutaneous trach orders written with Dr. Percival SpanishYacoub's guidance.      Intervention Category Minor Interventions: Routine modifications to care plan (e.g. PRN medications for pain, fever)  Sommer,Steven Eugene 04/09/2016, 10:24 PM

## 2016-04-09 NOTE — Progress Notes (Signed)
PT Cancellation Note  Patient Details Name: Laura Levy MRN: [id number] DOB: 1940-11-27   Cancelled Treatment:    Reason Eval/Treat Not Completed: Medical issues which prohibited therapy.  This is at least the 4th time PT has checked on this patient (on different days).  Last physician note seemed to indicate poor prognosis.  PT to sign off at this time.  Please re-consult if medical condition stabilizes and you believe this pt can participate more in therapy.   Thanks,   Rollene Rotundaebecca B. Doylene Splinter, PT, DPT [phone]   04/09/2016, 9:58 AM

## 2016-04-09 NOTE — Progress Notes (Signed)
eLink Physician-Brief Progress Note Patient Name: Laura Levy DOB: 1941/09/22 MRN: [id number]   Date of Service  04/09/2016  HPI/Events of Note  Low output, but day prior with high output 1800 out  eICU Interventions  Get cvp Get urine osm, na, UA sp grav Bolus for now once given prior high ouptput     Intervention Category Intermediate Interventions: Oliguria - evaluation and management  FEINSTEIN,DANIEL J. 04/09/2016, 1:39 AM

## 2016-04-10 ENCOUNTER — Inpatient Hospital Stay (HOSPITAL_COMMUNITY): Payer: Medicare Other

## 2016-04-10 DIAGNOSIS — I481 Persistent atrial fibrillation: Secondary | ICD-10-CM

## 2016-04-10 DIAGNOSIS — J9601 Acute respiratory failure with hypoxia: Secondary | ICD-10-CM

## 2016-04-10 LAB — COMPREHENSIVE METABOLIC PANEL
ALBUMIN: 2.5 g/dL — AB (ref 3.5–5.0)
ALT: 21 U/L (ref 14–54)
AST: 32 U/L (ref 15–41)
Alkaline Phosphatase: 70 U/L (ref 38–126)
Anion gap: 7 (ref 5–15)
BUN: 12 mg/dL (ref 6–20)
CHLORIDE: 104 mmol/L (ref 101–111)
CO2: 27 mmol/L (ref 22–32)
CREATININE: 1.11 mg/dL — AB (ref 0.44–1.00)
Calcium: 8.6 mg/dL — ABNORMAL LOW (ref 8.9–10.3)
GFR calc non Af Amer: 48 mL/min — ABNORMAL LOW (ref >60)
GFR, EST AFRICAN AMERICAN: 56 mL/min — AB (ref >60)
Glucose, Bld: 125 mg/dL — ABNORMAL HIGH (ref 65–99)
Potassium: 4.3 mmol/L (ref 3.5–5.1)
SODIUM: 138 mmol/L (ref 135–145)
Total Bilirubin: 0.8 mg/dL (ref 0.3–1.2)
Total Protein: 5.9 g/dL — ABNORMAL LOW (ref 6.5–8.1)

## 2016-04-10 LAB — CBC WITH DIFFERENTIAL/PLATELET
BASOS ABS: 0 10*3/uL (ref 0.0–0.1)
BASOS PCT: 0 %
EOS ABS: 0.4 10*3/uL (ref 0.0–0.7)
EOS PCT: 3 %
HCT: 21.8 % — ABNORMAL LOW (ref 36.0–46.0)
Hemoglobin: 6.9 g/dL — CL (ref 12.0–15.0)
Lymphocytes Relative: 6 %
Lymphs Abs: 1 10*3/uL (ref 0.7–4.0)
MCH: 29.1 pg (ref 26.0–34.0)
MCHC: 31.7 g/dL (ref 30.0–36.0)
MCV: 92 fL (ref 78.0–100.0)
Monocytes Absolute: 1.9 10*3/uL — ABNORMAL HIGH (ref 0.1–1.0)
Monocytes Relative: 12 %
Neutro Abs: 12.7 10*3/uL — ABNORMAL HIGH (ref 1.7–7.7)
Neutrophils Relative %: 79 %
PLATELETS: 359 10*3/uL (ref 150–400)
RBC: 2.37 MIL/uL — AB (ref 3.87–5.11)
RDW: 14.9 % (ref 11.5–15.5)
WBC: 16 10*3/uL — AB (ref 4.0–10.5)

## 2016-04-10 LAB — GLUCOSE, CAPILLARY
GLUCOSE-CAPILLARY: 126 mg/dL — AB (ref 65–99)
Glucose-Capillary: 114 mg/dL — ABNORMAL HIGH (ref 65–99)
Glucose-Capillary: 115 mg/dL — ABNORMAL HIGH (ref 65–99)
Glucose-Capillary: 116 mg/dL — ABNORMAL HIGH (ref 65–99)
Glucose-Capillary: 129 mg/dL — ABNORMAL HIGH (ref 65–99)
Glucose-Capillary: 94 mg/dL (ref 65–99)

## 2016-04-10 LAB — PROTIME-INR
INR: 1.21 (ref 0.00–1.49)
Prothrombin Time: 15.5 seconds — ABNORMAL HIGH (ref 11.6–15.2)

## 2016-04-10 LAB — APTT: aPTT: 34 seconds (ref 24–37)

## 2016-04-10 LAB — PREPARE RBC (CROSSMATCH)

## 2016-04-10 LAB — ADAMTS13 ACTIVITY

## 2016-04-10 LAB — ADAMTS13 ANTIBODY: ADAMTS13 Antibody: 39 u/mL — ABNORMAL HIGH (ref <12)

## 2016-04-10 LAB — LACTATE DEHYDROGENASE: LDH: 281 U/L — ABNORMAL HIGH (ref 98–192)

## 2016-04-10 MED ORDER — SODIUM CHLORIDE 0.9 % IV SOLN
Freq: Once | INTRAVENOUS | Status: AC
  Administered 2016-04-10: 13:00:00 via INTRAVENOUS

## 2016-04-10 NOTE — Progress Notes (Signed)
Tube feedings resumed. D/w Herbert SetaHeather, RD, prior to start. Pep-up protocol used. Rate currently 120 cc/hr per calculation until midnight tonight then resume 50cc/hr.   Holly Bodilyulbertson, Bethany Leigh

## 2016-04-10 NOTE — Procedures (Signed)
Percutaneous Tracheostomy Placement  Consent from family.  Patient sedated, paralyzed and position.  Placed on 100% FiO2 and RR matched.  Area cleaned and draped.  Lidocaine/epi injected.  Skin incision done followed by blunt dissection.  Trachea palpated then punctured, catheter passed and visualized bronchoscopically.  Wire placed and visualized.  Catheter removed.  Airway then entered and dilated.  Size 6 cuffed shiley trach placed and visualized bronchoscopically well above carina.  Good volume returns.  Patient tolerated the procedure well without complications.  Minimal blood loss.  CXR ordered and pending.  Laura Levy, M.D. [place] [facility]. Pager: [phone]. After hours pager: [phone].  

## 2016-04-10 NOTE — Procedures (Signed)
Bronchoscopy Procedure Note Montine CircleVera M Nosbisch [id number] 1941/10/14  Procedure: Bronchoscopy Indications: To facilitate trach placement.   Procedure Details Consent: Risks of procedure as well as the alternatives and risks of each were explained to the (patient/caregiver).  Consent for procedure obtained. Time Out: Verified patient identification, verified procedure, site/side was marked, verified correct patient position, special equipment/implants available, medications/allergies/relevent history reviewed, required imaging and test results available.  Performed  In preparation for procedure, patient was given 100% FiO2. Sedation: Benzodiazepines, Muscle relaxants and Etomidate   FOB performed via ETT to allow perc trach placement. ETT withdrawn 5 cm and cannulation of trachea with needle and guidewire observed in real-time. There was no evidence of needle penetration of the posterior tracheal wall or wire injury seen. Please refer also to the trach procedure Note by Dr Molli KnockYacoub. After trach was placed there was no bleeding in the proximal trachea. FOB was withdrawn and reintroduced via the new trach. There was no distal bleeding seen. All airways inspected, no lesions, no significant secretions. Airway entered and the following bronchi were examined: RUL, RML, RLL, LUL and LLL.     Evaluation Hemodynamic Status: BP stable throughout; O2 sats: stable throughout Patient's Current Condition: stable Specimens:  None Complications: No apparent complications Patient did tolerate procedure well.   Levy Pupaobert Mariachristina Holle, MD, PhD 04/10/2016, 10:26 AM  [facility] [phone] or if no answer [phone]

## 2016-04-10 NOTE — Progress Notes (Signed)
Consent obtained from patient's son for tracheostomy. Sons and daughter at bedside and discussed with MD prior to consent.

## 2016-04-10 NOTE — Procedures (Signed)
Bedside Tracheostomy Insertion Procedure Note   Patient Details:   Name: Laura Levy DOB: [birth year] MRN: [id number]  Procedure: Tracheostomy  Pre Procedure Assessment: ET Tube Size:7.5 ET Tube secured at lip (cm):22 Bite block in place: no Breath Sounds: bilateral  Post Procedure Assessment: BP 103/50 mmHg  Pulse 69  Temp(Src) 100.4 F (38 C) (Axillary)  Resp 20  Ht 5\' 3"  (1.6 m)  Wt 159 lb 13.3 oz (72.5 kg)  BMI 28.32 kg/m2  SpO2 100% O2 sats: 100 Complications: none Patient did tolerate procedure well Tracheostomy Brand:shiley Tracheostomy Style cuffed Tracheostomy Size: 6.0 Tracheostomy Secured WUJ:[id number]:sutures and velcro ties Tracheostomy Placement Confirmation:direct visualization    Shanda BumpsBrewer, Kemyra August Faye 04/10/2016, 10:31 AM

## 2016-04-10 NOTE — Progress Notes (Signed)
PULMONARY / CRITICAL CARE MEDICINE   Name: Laura Levy MRN: [id number] DOB: 28-Jul-1941    ADMISSION DATE:  04/11/2016 CONSULTATION DATE:  04/27/2016   REFERRING MD:  Dr Elvera Lennox  CHIEF COMPLAINT:  Altermed MS and thrombocytopenia   HISTORY OF PRESENT ILLNESS:   Hx is obtained from notes and family, pt unable to give hx  75 y.o. female with medical history significant of hypertension, prior strokes, dementia, presented to [facility] emergency room 6/2 with chief complaint of altered mental status, slurred speech beginning the evening prior. CT scan showed subacute versus acute stroke bilateral cerebellum. 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 She transferred to [facility], has experienced progressive decline in wakefulness and airway protection. She was then noted to have rhythmic movements consistent w seizure activity, better after ativan. She moved to ICU and CCM  consulted to assist with her care.   SUBJECTIVE:  No changes in MS Note anemia this am  VITAL SIGNS: BP 113/56 mmHg  Pulse 70  Temp(Src) 100.4 F (38 C) (Axillary)  Resp 14  Ht  (1.6 m)  Wt 72.5 kg (159 lb 13.3 oz)  BMI 28.32 kg/m2  SpO2 100%  HEMODYNAMICS: CVP:  [5 mmHg-11 mmHg] 5 mmHg  VENTILATOR SETTINGS: Vent Mode:  [-] PRVC FiO2 (%):  [40 %] 40 % Set Rate:  [14 bmp] 14 bmp Vt Set:  [420 mL] 420 mL PEEP:  [5 cmH20] 5 cmH20 Plateau Pressure:  [14 cmH20] 14 cmH20  INTAKE / OUTPUT: I/O last 3 completed shifts: In: 4277 [I.V.:1157; Other:500; NG/GT:1540; IV Piggyback:1080] Out: 1347 [Urine:1347]  PHYSICAL EXAMINATION: General:  Ill appearing elderly woman, severe kyphosis Neuro:  Some grimace with pain, will not react to voice, no cough, leftward gaze HEENT:  Pooling secretions noted in OP, OGT in place Cardiovascular:  Regular, no M Lungs:  Coarse BS B, no wheezes.  Abdomen:  Soft, NT, + BS Musculoskeletal:  No LE  edema Skin:  Some tenting, no rash.   LABS:  BMET  Recent Labs Lab 04/08/16 0306 04/09/16 0521 04/10/16 0600  NA 141 140 138  K 3.2* 4.3 4.3  CL 106 107 104  CO2 BUN CREATININE 1.19* 1.19* 1.11*  GLUCOSE 139* 113* 125*    Electrolytes  Recent Labs Lab 04/08/16 0306 04/08/16 0518 04/08/16 1657 04/09/16 0521 04/10/16 0600  CALCIUM 8.4*  --   --  8.0* 8.6*  MG  --  1.8 1.7 1.7  --   PHOS  --  3.7 4.3 3.6  --     CBC  Recent Labs Lab 04/07/16 0330 04/07/16 0821 04/08/16 0306 04/10/16 0600  WBC 20.4*  --  18.2* 16.0*  HGB 8.8* 9.2* 8.5* 6.9*  HCT 28.0* 27.0* 26.6* 21.8*  PLT 170  --  239 359    Coag's  Recent Labs Lab 04/10/2016 2035 04/06/16 0411 04/10/16 0233  APTT 33 24 34  INR 1.40 1.27 1.21    Sepsis Markers No results for input(s): LATICACIDVEN, PROCALCITON, O2SATVEN in the last 168 hours.  ABG No results for input(s): PHART, PCO2ART, PO2ART in the last 168 hours.  Liver Enzymes  Recent Labs Lab 04/24/2016 1658 04/06/16 0411 04/10/16 0600  AST 67* 40 32  ALT ALKPHOS 52 51 70  BILITOT 2.5* 2.1* 0.8  ALBUMIN 3.3* 2.7* 2.5*    Cardiac Enzymes  Recent Labs Lab 04/07/16 1545 04/07/16  2128 04/08/16 0306  TROPONINI 3.23* 3.70* 3.67*    Glucose  Recent Labs Lab 04/09/16 1152 04/09/16 1551 04/09/16 1919 04/09/16 2325 04/10/16 0319 04/10/16 0750  GLUCAP 115* 132* 135* 147* 116* 115*    Imaging No results found.   STUDIES:  Head Ct 6/2 >> extensive chronic ischemic changes, new acute / subacute L > R cerebellar infarcts, no mass effect or bleed  CT chest 6/2 >> no dissection, no PE, emphysematous changes CT abdomen/ pelvis 6/2 >> cholelithiasis without evidence obstruction, no other acute findings.  6/3 MRI Infarct left cerebellum, pons infarct, multiple embolic infarcts, rt vertebral artery occlusion   CULTURES: None  ANTIBIOTICS: None  SIGNIFICANT EVENTS: Intubated 6/2 for airway  protection 6/3 plasma phoresis  6/3 PRBC transfusion   LINES/TUBES: ETT 6/2 >>  6/2 rt i j hd cath>>  DISCUSSION: 75 yo woman with HTN, mild dementia, hx CVA's admitted 6/2 with acute altered MS. Eval has revealed B cerebellar CVA's, mild acute renal failure, severe thrombocytopenia w schistocytes, NSTEMI. Suspect cerebellar and cardiac ischemia due to microvascular disease and pro-thrombotic state as opposed to macroembolism. Entire picture consistent w TTP, appears to be responding to plasmaphoresis. 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 I attempted to be very clear about her poor prognosis for meaningful improvement. May need to also involve Palliative Care as we go forward - I predict that she will not clinically change even w longer support, will need to discuss withdrawal of care.  Albuterol prn  CARDIOVASCULAR A:  Elevated troponin I, ? Due to microvascular thrombosis. Peaked at 5 and decreased to 3.9 on 6/3. Note focal wall motion abnormalities on TTE > hypokinesis of the apicalanteroseptal, anterior, and apical myocardium. Hx hypertension Atrial fib P:  D/c'd amiodarone 6/7 Hold anti-HTN regimen Appreciate hematology recs > OK to restart ASA or even heparin if indicated but would NOT restart plavix given its association w TTP. ASA restarted on 6/6 per tube Consider cardiology eval of NSTEMI if she improves neuro status. Otherwise would defer any invasive workup  RENAL Lab Results  Component Value Date   CREATININE 1.11* 04/10/2016   CREATININE 1.19* 04/09/2016   CREATININE 1.19* 04/08/2016   CREATININE 1.27 12/01/2013   CREATININE 1.11 08/11/2012    Recent Labs Lab 04/08/16 0306  04/09/16 0521 04/10/16 0600  K 3.2* 4.3 4.3    A:   Acute renal failure, note S Cr 1.06 in 02/2016; improved P:   Follow BMP, electrolytes Foley to manage I/O's  GASTROINTESTINAL A:   Reported hx H pylori SUP P:   pepcid   HEMATOLOGIC A:    Recent Labs  04/08/16 0306 04/10/16 0600  HGB 8.5* 6.9*    Severe thrombocytopenia, etiology unclear but note episode of thrombocytopenia during her hosp in 3/'17 as well, consider drug induced (amoxicillin/clarithromycin). Suspect TTP now given response to plasmaphoresis Normocytic anemia P:  Hematology assistance appreciated Plasma phoresis 3 of 3 completed on 6/6 Transfuse 1u PRBC 6/8 for hgb 6.9 Consider hemolysis workup if Hgb drops further  INFECTIOUS A:   No clear source infxn at this time.  P:   Follow clinically, no abx for now Note possible rxn to amoxicillin or clarithromycin on presentation  ENDOCRINE A:   Hyperlipidemia    P:   Statin has been on hold for last few days prior to admission (since her outpt abx were started)  NEUROLOGIC A:   Acute cerebellar CVA's, suspect contribution of thrombocytopenia and pro-thrombotic state Probable seizure activity noted 6/2 pm P:   RASS goal: 0 Ativan given x 1, keppra ordered 6/2 Sedation per PAD protocol > will decrease once trach in place ASA restarted 6/6 EEG and MRI/A brain show significant injuries, poor prognosis for meaningful recovery Carotid US   FAMILY  - Updates: Dr. Delton Coombes on 6/2, 6/5, 6/7 reviewed clinical status, organ injuries and prognosis with pt's brother at bedside. Explained that her age, TTP and prior CVa's make her prognosis for meaningful survival poor. They want her supported fully until we can determine the severity of her Acute CVA's, the potential for recovery.  Confirmed 6/7 that they would want trach and longer support despite the poor prognosis.    - Inter-disciplinary family meet or Palliative Care meeting due by:   04/11/16  Independent CC time 35 minutes   Levy Pupa, MD, PhD 04/10/2016, 9:19 AM [place] [facility] [phone] or if no answer [phone]

## 2016-04-10 NOTE — Progress Notes (Signed)
OT Cancellation Note  Patient Details Name: Montine CircleVera M Gottschall MRN: [id number] DOB: 08/05/1941   Cancelled Treatment:    Reason Eval/Treat Not Completed: Medical issues which prohibited therapy;Patient at procedure or test/ unavailable (bedside trach and PEG). Will follow up for OT eval tomorrow.  Gaye AlkenBailey A Shree Espey M.S., OTR/L Pager: [phone]  04/10/2016, 9:55 AM

## 2016-04-10 NOTE — Care Management Important Message (Signed)
Important Message  Patient Details  Name: Laura Levy MRN: [id number] Date of Birth: [birth year]   Medicare Important Message Given:  Yes    Kyla BalzarineShealy, Aleighna Wojtas Abena 04/10/2016, 10:37 AM

## 2016-04-10 NOTE — Progress Notes (Signed)
STROKE TEAM PROGRESS NOTE   SUBJECTIVE (INTERVAL HISTORY) Son at bedside. Trach consent signed and pt will have trach today. Son still promising about pt recovery. Pt neuro no change.    OBJECTIVE Temp:  [98.2 F (36.8 C)-100.4 F (38 C)] 100.4 F (38 C) (06/08 0800) Pulse Rate:  [66-141] 70 (06/08 0730) Cardiac Rhythm:  [-] Normal sinus rhythm (06/08 0800) Resp:  [13-17] 14 (06/08 0730) BP: (96-147)/(49-80) 113/56 mmHg (06/08 0730) SpO2:  [100 %] 100 % (06/08 0730) FiO2 (%):  [40 %] 40 % (06/08 0400) Weight:  [72.5 kg (159 lb 13.3 oz)] 72.5 kg (159 lb 13.3 oz) (06/08 0530)  CBC:   Recent Labs Lab 04/07/16 0330  04/08/16 0306 04/10/16 0600  WBC 20.4*  --  18.2* 16.0*  NEUTROABS 16.3*  --   --  12.7*  HGB 8.8*  < > 8.5* 6.9*  HCT 28.0*  < > 26.6* 21.8*  MCV 90.6  --  90.2 92.0  PLT 170  --  239 359  < > = values in this interval not displayed.  Basic Metabolic Panel:   Recent Labs Lab 04/08/16 1657 04/09/16 0521 04/10/16 0600  NA  --  140 138  K  --  4.3 4.3  CL  --  107 104  CO2  --  26 27  GLUCOSE  --  113* 125*  BUN  --  12 12  CREATININE  --  1.19* 1.11*  CALCIUM  --  8.0* 8.6*  MG 1.7 1.7  --   PHOS 4.3 3.6  --     Lipid Panel:     Component Value Date/Time   CHOL 120 04/05/2016 0524   CHOL 116 08/13/2012 0424   TRIG 57 04/05/2016 0524   TRIG 47 08/13/2012 0424   HDL 37* 04/05/2016 0524   HDL 45 08/13/2012 0424   CHOLHDL 3.2 04/05/2016 0524   VLDL 11 04/05/2016 0524   VLDL 9 08/13/2012 0424   LDLCALC 72 04/05/2016 0524   LDLCALC 62 08/13/2012 0424   HgbA1c:  Lab Results  Component Value Date   HGBA1C 4.6* 04/05/2016   Urine Drug Screen: No results found for: LABOPIA, COCAINSCRNUR, LABBENZ, AMPHETMU, THCU, LABBARB    IMAGING I have personally reviewed the radiological images below and agree with the radiology interpretations.  Mr Maxine GlennMra Head Wo Contrast 04/05/2016   1. Large confluent acute nonhemorrhagic infarct within the left inferior  cerebellum.  2. Two small acute nonhemorrhagic infarcts within the pons.  3. Multi focal punctate areas of acute nonhemorrhagic infarction over the convexities bilaterally, more prominent on the right. This appears to be in a watershed distribution. Embolic infarcts are considered less likely.  4. More focal acute nonhemorrhagic infarct involving the left precentral gyrus measuring 15 mm along the primary motor cortex.  5. Multiple remote lacunar infarcts of the cerebellum bilaterally.  6. Extensive areas of encephalomalacia involving the frontal lobes bilaterally.  7. 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  8. Mild moderate narrowing in the mid basilar artery.  9. Fetal type posterior cerebral arteries bilaterally.  10. The proximal anterior circulation is intact.  11. Moderate distal small vessel disease in the MCA branch vessels bilaterally, worse on the right. The pattern suggests the possibility of a vasculitis.  12. No significant flow disturbance at either carotid bifurcation.  13. Flow is antegrade in the vertebral arteries bilaterally. The left vertebral artery is dominant.   Transthoracic echocardiogram 04/05/2016 Study Conclusions - Left ventricle: The cavity size was  normal. There was mild focal  basal hypertrophy of the septum. Systolic function was normal.  The estimated ejection fraction was in the range of 50% to 55%.  Probable hypokinesis of the apicalanteroseptal, anterior, and  apical myocardium. Doppler parameters are consistent with  abnormal left ventricular relaxation (grade 1 diastolic  dysfunction). - Mitral valve: Calcified annulus. There was mild to moderate  regurgitation. - Tricuspid valve: There was mild-moderate regurgitation directed  centrally. - Pulmonary arteries: Systolic pressure was mildly increased. PA  peak pressure: 34 mm Hg (S).  EEG 04/06/2016 Impression:  Abnormal routine inpatient EEG suggestive of at least moderate encephalopathy  as described. Clinical correlation is recommended   CUS - hold off due to central line at neck.    PHYSICAL EXAM Frail cachectic elderly African-American lady intubated. On ventilatory support. Afebrile. Head is nontraumatic. Neck is supple with central line placement. Cardiac exam RRR.   Neurological Exam :  Patient is intubated. Eyes are open at middle position. She does not blink to threat on either side. Patient does not follow any commands. Pupils irregular 3 mm but reactive, eyes right gaze preference. Doll's eye movements are sluggish. Fundi could not be visualized. Tongue is midline. Motor system exam reveals spastic right hemiparesis with increased tone with fixed flexion contracture of the fingers and the wrist on the right. Patient has significant paratonia on the left and will resist moments. She does withdraw to pain slightly more on the left LE compared to the right LE. Both plantars are upgoing. Gait cannot be tested.    ASSESSMENT/PLAN Ms. Laura Levy is a 75 y.o. female with history of hypertension, previous strokes, and dementia, presenting obtunded. She did not receive IV t-PA due to late presentation.  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   Resultant - intubated and AMS. 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 Fetal PCA bilaterally.  Carotid Doppler - hold off due to central line  2D Echo - EF 50-55%. No cardiac source of emboli identified.  EEG - moderate encephalopathy.  LDL - 72  HgbA1c 4.6  VTE prophylaxis - SCDs Diet NPO time specified  aspirin 81 mg daily and clopidogrel 75 mg daily prior to admission, now on ASA 81mg   Ongoing aggressive stroke risk factor management  Therapy recommendations: Pending (resume PT for tomorrow, they had signed off)  Disposition:  Pending  TTP  Typical presentation with AMS, vomiting, abdominal pain, thrombocytopenia   Platelet 13->14->128->123->170->239->359  Respond to plasmapheresis well   Peripheral blood smear confirmed schistocytes  Hematology on board  New diagnosed afib  afib was recorded on tele  Currently on amiodarone drip  ASA restarted  Hematology OK with heparin use if needed  Recommend anticoagulation once no procedure planned (such as trach, peg, etc.)  History of stroke  Residue of right spastic hemiparesis  Vascular dementia  Walk with cane at home  Need assistance with ADLs   ? Seizure  Status epilepticus suspected at time of admission  EEG no seizure  continue keppra 1000mg  bid  Hypertension  Blood pressure tends to run somewhat low   BP goal normotensive now  Hyperlipidemia  Home meds:  Lipitor 80 mg daily prior to admission  LDL 72, goal < 70  Avoid lipitor this time due to TTP  Other Stroke Risk Factors  Advanced age  Hx stroke/TIA  Other Active Problems  Anemia  Leukocytosis   Hypokalemia -  supplemented  Plavix allergy listed - potential cause for TTP - avoid plavix use  Hospital day # 6  This patient is critically ill due to embolic stroke, TTP, anemia, seizure and at significant risk of neurological worsening, death form recurrent stroke, bleeding, status epilepticus. This patient's care requires constant monitoring of vital signs, hemodynamics, respiratory and cardiac monitoring, review of multiple databases, neurological assessment, discussion with family, other specialists and medical decision making of high complexity. I had discussion with son again today and updated her current condition, treatment plan and prognosis. Son still wants to continue aggressive care. I spent 35 minutes of neurocritical care time in the care of this patient.  Neurology recommends anticoagulation once no procedure planned. Other than that, nothing to add at  this time. will sign off for now. Please call with questions. Thanks for the consult.  Marvel Plan, MD PhD Stroke Neurology 04/10/2016 9:44 AM  To contact Stroke Continuity provider, please refer to [email or web address]. After hours, contact General Neurology

## 2016-04-11 DIAGNOSIS — R000 Tachycardia, unspecified: Secondary | ICD-10-CM

## 2016-04-11 DIAGNOSIS — N189 Chronic kidney disease, unspecified: Secondary | ICD-10-CM

## 2016-04-11 LAB — GLUCOSE, CAPILLARY
GLUCOSE-CAPILLARY: 104 mg/dL — AB (ref 65–99)
GLUCOSE-CAPILLARY: 124 mg/dL — AB (ref 65–99)
Glucose-Capillary: 116 mg/dL — ABNORMAL HIGH (ref 65–99)
Glucose-Capillary: 118 mg/dL — ABNORMAL HIGH (ref 65–99)
Glucose-Capillary: 120 mg/dL — ABNORMAL HIGH (ref 65–99)
Glucose-Capillary: 151 mg/dL — ABNORMAL HIGH (ref 65–99)

## 2016-04-11 LAB — CBC WITH DIFFERENTIAL/PLATELET
BASOS ABS: 0 10*3/uL (ref 0.0–0.1)
BASOS PCT: 0 %
Eosinophils Absolute: 0.8 10*3/uL — ABNORMAL HIGH (ref 0.0–0.7)
Eosinophils Relative: 6 %
HEMATOCRIT: 25.7 % — AB (ref 36.0–46.0)
HEMOGLOBIN: 8.1 g/dL — AB (ref 12.0–15.0)
LYMPHS PCT: 11 %
Lymphs Abs: 1.5 10*3/uL (ref 0.7–4.0)
MCH: 28.9 pg (ref 26.0–34.0)
MCHC: 31.5 g/dL (ref 30.0–36.0)
MCV: 91.8 fL (ref 78.0–100.0)
MONO ABS: 1.4 10*3/uL — AB (ref 0.1–1.0)
Monocytes Relative: 10 %
NEUTROS ABS: 10.2 10*3/uL — AB (ref 1.7–7.7)
NEUTROS PCT: 73 %
PLATELETS: 299 10*3/uL (ref 150–400)
RBC: 2.8 MIL/uL — AB (ref 3.87–5.11)
RDW: 15.4 % (ref 11.5–15.5)
WBC: 13.8 10*3/uL — AB (ref 4.0–10.5)

## 2016-04-11 LAB — URINE CULTURE: Culture: >=100000 — AB

## 2016-04-11 LAB — TYPE AND SCREEN
ABO/RH(D): AB POS
ANTIBODY SCREEN: NEGATIVE
UNIT DIVISION: 0

## 2016-04-11 MED ORDER — AMIODARONE HCL 200 MG PO TABS
200.0000 mg | ORAL_TABLET | Freq: Every day | ORAL | Status: DC
Start: 2016-04-11 — End: 2016-04-14
  Administered 2016-04-11 – 2016-04-13 (×3): 200 mg
  Filled 2016-04-11 (×3): qty 1

## 2016-04-11 MED ORDER — ATORVASTATIN CALCIUM 80 MG PO TABS
80.0000 mg | ORAL_TABLET | Freq: Every day | ORAL | Status: DC
  Administered 2016-04-11 – 2016-04-13 (×3): 80 mg
  Filled 2016-04-11 (×3): qty 1

## 2016-04-11 MED ORDER — METOPROLOL TARTRATE 25 MG/10 ML ORAL SUSPENSION
12.5000 mg | Freq: Two times a day (BID) | ORAL | Status: DC
  Administered 2016-04-11 – 2016-04-13 (×6): 12.5 mg
  Filled 2016-04-11 (×4): qty 10
  Filled 2016-04-11: qty 5
  Filled 2016-04-11: qty 10

## 2016-04-11 MED ORDER — FENTANYL CITRATE (PF) 100 MCG/2ML IJ SOLN
50.0000 ug | INTRAMUSCULAR | Status: DC | PRN
  Administered 2016-04-11 (×2): 50 ug via INTRAVENOUS
  Filled 2016-04-11 (×2): qty 2

## 2016-04-11 MED ORDER — CIPROFLOXACIN IN D5W 400 MG/200ML IV SOLN
400.0000 mg | Freq: Two times a day (BID) | INTRAVENOUS | Status: DC
  Administered 2016-04-11 – 2016-04-13 (×6): 400 mg via INTRAVENOUS
  Filled 2016-04-11 (×7): qty 200

## 2016-04-11 NOTE — Evaluation (Signed)
Physical Therapy Evaluation Patient Details Name: Laura Levy MRN: [id number] DOB: 10-06-1941 Today's Date: 04/11/2016   History of Present Illness  75 y.o. female with a known history of Hypertension, history of stroke, dementia, history of edema of the leg. Was brought in by family due to patient having difficulty with walking.  MRI (+)  Large confluent acute nonhemorrhagic infarct within the left inferior cerebellum. Two small acute nonhemorrhagic infarcts within the pons. Multi focal punctate areas of acute nonhemorrhagic infarction over the convexities bilaterally, more prominent on the right. This appears to be in a watershed distribution. It embolic infarcts are considered less likely. More focal acute nonhemorrhagic infarct involving the left precentral gyrus measuring 15 mm along the primary motor cortex. Multiple remote lacunar infarcts of the cerebellum bilaterally. Extensive areas of encephalomalacia involving the frontal lobes  Clinical Impression  Patient demonstrates deficits in functional mobility as indicated below. Will need continued skilled PT to address deficits and decreased burden of care. Will see as indicated and progress as tolerated. Given level of impairments as well as current functional status, anticipate patient will need LTACH upon acute discharge. Will follow.     Follow Up Recommendations LTACH;Supervision/Assistance - 24 hour    Equipment Recommendations  Wheelchair (measurements PT);Wheelchair cushion (measurements PT);Hospital bed    Recommendations for Other Services       Precautions / Restrictions Precautions Precautions: Fall Precaution Comments: increase risk for skin break down Restrictions Weight Bearing Restrictions: No      Mobility  Bed Mobility Overal bed mobility: +2 for physical assistance;Needs Assistance Bed Mobility: Rolling Rolling: Total assist;+2 for physical assistance         General bed mobility comments: total (A) with  full extension -  unsafe to attempt upright at this time  Transfers                    Ambulation/Gait                Stairs            Wheelchair Mobility    Modified Rankin (Stroke Patients Only) Modified Rankin (Stroke Patients Only) Pre-Morbid Rankin Score: Moderate disability Modified Rankin: Severe disability     Balance                                             Pertinent Vitals/Pain Pain Assessment: No/denies pain    Home Living Family/patient expects to be discharged to:: Skilled nursing facility                 Additional Comments: from home with family, used a cane with R side residual deficits ( weakness noted in chart) All information noted in chart reviewed    Prior Function Level of Independence: Needs assistance      ADL's / Homemaking Assistance Needed: self feeds and toilets        Hand Dominance   Dominant Hand:  (unknown)    Extremity/Trunk Assessment   Upper Extremity Assessment: RUE deficits/detail;LUE deficits/detail RUE Deficits / Details: hand digit flexed unable to range digits, full extension tone present. OT unable to break tone at this evaluation even with position changes.  (clonus noted)     LUE Deficits / Details: full tension, elbow flexion with position change and tone present   Lower Extremity Assessment: RLE deficits/detail;LLE deficits/detail;Difficult to assess due to  impaired cognition RLE Deficits / Details: tone full extension LLE Deficits / Details: tone full extension (R>L)     Communication   Communication: Expressive difficulties  Cognition Arousal/Alertness: Awake/alert Behavior During Therapy: Flat affect Overall Cognitive Status: History of cognitive impairments - at baseline                      General Comments General comments (skin integrity, edema, etc.): hygiene and pericare performed during session    Exercises        Assessment/Plan     PT Assessment Patient needs continued PT services  PT Diagnosis Difficulty walking;Abnormality of gait;Generalized weakness;Acute pain;Altered mental status   PT Problem List Decreased strength;Decreased range of motion;Decreased activity tolerance;Decreased balance;Decreased mobility;Decreased coordination;Decreased cognition;Cardiopulmonary status limiting activity;Impaired sensation;Impaired tone  PT Treatment Interventions DME instruction;Gait training;Functional mobility training;Therapeutic activities;Therapeutic exercise;Balance training;Neuromuscular re-education;Cognitive remediation;Patient/family education;Wheelchair mobility training;Manual techniques;Modalities   PT Goals (Current goals can be found in the Care Plan section) Acute Rehab PT Goals Patient Stated Goal: none stated PT Goal Formulation: Patient unable to participate in goal setting Time For Goal Achievement: 04/25/16 Potential to Achieve Goals: Poor    Frequency Min 2X/week   Barriers to discharge        Co-evaluation               End of Session   Activity Tolerance: Patient tolerated treatment well Patient left: in bed;with call bell/phone within reach;with nursing/sitter in room Nurse Communication: Mobility status         Time: 1540-1601 PT Time Calculation (min) (ACUTE ONLY): 21 min   Charges:   PT Evaluation $PT Eval High Complexity: 1 Procedure     PT G CodesFabio Levy:        Laura Levy J 04/11/2016, 5:07 PM Laura Levy, PT DPT  Laura Levy

## 2016-04-11 NOTE — Progress Notes (Signed)
Remainder of fentanyl drip wasted in sink, 100 cc. Sullivan LoneJamie White RN as witness.

## 2016-04-11 NOTE — Progress Notes (Signed)
Case management referral for Long Term Acute Care.   [facility]; discuss with family and offer choice.    Quintella BatonJulie W. Gerritt Galentine, RN, BSN  Trauma/Neuro ICU Case Manager [phone]

## 2016-04-11 NOTE — Progress Notes (Signed)
Laura Levy   DOB:1941-06-13   ZO#:[id number]    Subjective: Patient is sedated, ventilated without family present. She had tracheostomy yesterday without events  Objective:  Filed Vitals:   04/11/16 0700 04/11/16 0836  BP:    Pulse:  97  Temp:    Resp: 21 14     Intake/Output Summary (Last 24 hours) at 04/11/16 0840 Last data filed at 04/11/16 9811  Gross per 24 hour  Intake 2398.51 ml  Output   1900 ml  Net 498.51 ml    GENERAL:Sedated and ventilated SKIN: skin color, texture, turgor are normal, no rashes or significant lesions LUNGS: clear to auscultation and percussion with normal breathing effort HEART: Tachycardia, regular rate & rhythm and no murmurs and no lower extremity edema NEURO: Unable to assess   Labs:  Lab Results  Component Value Date   WBC 13.8* 04/11/2016   HGB 8.1* 04/11/2016   HCT 25.7* 04/11/2016   MCV 91.8 04/11/2016   PLT 299 04/11/2016   NEUTROABS 10.2* 04/11/2016    Lab Results  Component Value Date   NA 138 04/10/2016   K 4.3 04/10/2016   CL 104 04/10/2016   CO2 27 04/10/2016    Studies:  Dg Chest Port 1 View  04/10/2016  CLINICAL DATA:  Tracheostomy placement.  Shortness of breath EXAM: PORTABLE CHEST 1 VIEW COMPARISON:  April 07, 2016 FINDINGS: Tracheostomy catheter present with tip 4.2 cm above the carina. Central catheter tip is in the superior vena cava. No pneumothorax. No edema or consolidation. Heart is upper normal in size with pulmonary vascularity within normal limits. No adenopathy. No bone lesions. IMPRESSION: Tube and catheter positions as described without pneumothorax. No edema or consolidation. Electronically Signed   By: Bretta Bang III M.D.   On: 04/10/2016 11:21   Dg Abd Portable 1v  04/10/2016  CLINICAL DATA:  Feeding tube placement EXAM: PORTABLE ABDOMEN - 1 VIEW COMPARISON:  04/07/2016 FINDINGS: Feeding tube with the metallic tip projecting over antrum of the stomach. There is a moderate amount of stool in the  ascending colon. There is no bowel dilatation to suggest obstruction. There is no evidence of pneumoperitoneum, portal venous gas or pneumatosis. There are no pathologic calcifications along the expected course of the ureters. The osseous structures are unremarkable. IMPRESSION: Feeding tube with the metallic tip projecting over antrum of the stomach. Electronically Signed   By: Elige Ko   On: 04/10/2016 13:45    Assessment & Plan:   TTP resolved with plasmapheresis Recent vitamin B-12 level was adequate. ADAMTS 13 came back low, confirmed diagnosis of TTP.  She has responded well to plasmapheresis with improvement of platelet count, total 3 sessions, last on 04/07/16 I recommend holding off plasmapheresis next few days. Will reassess next Monday  Acute anemia with elevated bilirubin, suspect hemolysis, status post blood transfusion Her hemoglobin is stable since blood transfusion Plan to transfuse when necessary to keep hemoglobin greater than 7 She may now have component of anemia chronic disease I will order EPO level tomorrow and consider darbopoetin in the future  Mild acute on chronic renal failure, currently oliguric  CT scan show evidence of bilateral renal artery stenosis but no other pathology related to renal obstruction Likely related to recent thrombotic complications from TTP Defer to primary service, consider nephrology consult if appropriate  Altered mental status with recent witnessed seizure Significant bilateral infarct Appreciate neurology consultation  Elevated troponin, tachycardia, intermittent atrial fibrillation  Cause is unknown but acute coronary event  on background history of ischemic heart disease is a possibility given history of smoking. TTP can also cause possibility of thrombotic complications With improvement of platelet count, there is no contraindication for her to be placed on heparin or aspirin I highly recommend NOT to put her back on Plavix as  it has been described as a potential cause for TTP She had received amiodarone recently.  Goals of care I had a brief discussion with the son on 04/07/16. Even though she had responded to plasmapheresis with improvement of hematological parameters, the damage to her cardiac function and her brain may not be reversible Now, she may have another complication, namely renal dysfunction I think the son has unrealistic expectation that her mother will improve to the point of her baseline similar to prior admission (Of note, she has baseline dementia with recurrent hospitalization and was residing in nursing prior to admission) Prognosis with TTP is typically very poor without expected meaningful recovery in this case I highly recommend involvement of palliative care consult in the near future with goals of transitioning her care to comfort care and hospice to alleviate further suffering  Discharge planning The patient is very ill. She will remain in ICU for now I will return on Monday to follow  Shoals HospitalGORSUCH, Laura Crowell, MD 04/11/2016  8:40 AM

## 2016-04-11 NOTE — Progress Notes (Signed)
PULMONARY / CRITICAL CARE MEDICINE   Name: Laura Levy MRN: [id number] DOB: 1941-04-29    ADMISSION DATE:  04/09/2016 CONSULTATION DATE:  04/27/2016   REFERRING MD:  Dr Elvera LennoxGherghe  CHIEF COMPLAINT:  Altermed MS and thrombocytopenia   HISTORY OF PRESENT ILLNESS:   Hx is obtained from notes and family, pt unable to give hx  75 y.o. female with medical history significant of hypertension, prior strokes, dementia, presented to [facility] emergency room 6/2 with chief complaint of altered mental status, slurred speech beginning the evening prior. CT scan showed subacute versus acute stroke bilateral cerebellum. 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 [facility], has experienced progressive decline in wakefulness and airway protection. She was then noted to have rhythmic movements consistent w seizure activity, better after ativan. She moved to ICU and CCM  consulted to assist with her care.   SUBJECTIVE:  No more awake than last 4 days S/p trach without complication 6/8  VITAL SIGNS: BP 145/76 mmHg  Pulse 97  Temp(Src) 98.5 F (36.9 C) (Axillary)  Resp 14  Ht 5\' 3"  (1.6 m)  Wt 72.7 kg (160 lb 4.4 oz)  BMI 28.40 kg/m2  SpO2 100%  HEMODYNAMICS: CVP:  [7 mmHg-15 mmHg] 15 mmHg  VENTILATOR SETTINGS: Vent Mode:  [-] PRVC FiO2 (%):  [40 %-100 %] 40 % Set Rate:  [14 bmp-20 bmp] 14 bmp Vt Set:  [420 mL] 420 mL PEEP:  [5 cmH20] 5 cmH20 Plateau Pressure:  [12 cmH20-19 cmH20] 14 cmH20  INTAKE / OUTPUT: I/O last 3 completed shifts: In: 3211 [I.V.:726; Blood:335; NG/GT:1670; IV Piggyback:480] Out: 2630 [Urine:2630]  PHYSICAL EXAMINATION: General:  Ill appearing elderly woman, severe kyphosis Neuro:  Some grimace with pain, will not react to voice, no cough, leftward gaze HEENT:  Trach in place, NGT in place Cardiovascular:  Regular, no M Lungs:  Coarse BS B, no wheezes.  Abdomen:  Soft, NT, +  BS Musculoskeletal:  No LE edema Skin:  Some tenting, no rash.   LABS:  BMET  Recent Labs Lab 04/08/16 0306 04/09/16 0521 04/10/16 0600  NA 141 140 138  K 3.2* 4.3 4.3  CL 106 107 104  CO2 28 26 27   BUN 7 12 12   CREATININE 1.19* 1.19* 1.11*  GLUCOSE 139* 113* 125*    Electrolytes  Recent Labs Lab 04/08/16 0306 04/08/16 0518 04/08/16 1657 04/09/16 0521 04/10/16 0600  CALCIUM 8.4*  --   --  8.0* 8.6*  MG  --  1.8 1.7 1.7  --   PHOS  --  3.7 4.3 3.6  --     CBC  Recent Labs Lab 04/08/16 0306 04/10/16 0600 04/11/16 0620  WBC 18.2* 16.0* 13.8*  HGB 8.5* 6.9* 8.1*  HCT 26.6* 21.8* 25.7*  PLT 239 359 299    Coag's  Recent Labs Lab 04/05/2016 2035 04/06/16 0411 04/10/16 0233  APTT 33 24 34  INR 1.40 1.27 1.21    Sepsis Markers No results for input(s): LATICACIDVEN, PROCALCITON, O2SATVEN in the last 168 hours.  ABG No results for input(s): PHART, PCO2ART, PO2ART in the last 168 hours.  Liver Enzymes  Recent Labs Lab 04/25/2016 1658 04/06/16 0411 04/10/16 0600  AST 67* 40 32  ALT 23 19 21   ALKPHOS 52 51 70  BILITOT 2.5* 2.1* 0.8  ALBUMIN 3.3* 2.7* 2.5*    Cardiac Enzymes  Recent Labs Lab 04/07/16 1545 04/07/16 2128 04/08/16 0306  TROPONINI  3.23* 3.70* 3.67*    Glucose  Recent Labs Lab 04/10/16 0750 04/10/16 1208 04/10/16 1539 04/10/16 1948 04/10/16 2338 04/11/16 0418  GLUCAP 115* 126* 94 114* 129* 104*    Imaging Dg Chest Port 1 View  04/10/2016  CLINICAL DATA:  Tracheostomy placement.  Shortness of breath EXAM: PORTABLE CHEST 1 VIEW COMPARISON:  April 07, 2016 FINDINGS: Tracheostomy catheter present with tip 4.2 cm above the carina. Central catheter tip is in the superior vena cava. No pneumothorax. No edema or consolidation. Heart is upper normal in size with pulmonary vascularity within normal limits. No adenopathy. No bone lesions. IMPRESSION: Tube and catheter positions as described without pneumothorax. No edema or  consolidation. Electronically Signed   By: Bretta Bang III M.D.   On: 04/10/2016 11:21   Dg Abd Portable 1v  04/10/2016  CLINICAL DATA:  Feeding tube placement EXAM: PORTABLE ABDOMEN - 1 VIEW COMPARISON:  04/07/2016 FINDINGS: Feeding tube with the metallic tip projecting over antrum of the stomach. There is a moderate amount of stool in the ascending colon. There is no bowel dilatation to suggest obstruction. There is no evidence of pneumoperitoneum, portal venous gas or pneumatosis. There are no pathologic calcifications along the expected course of the ureters. The osseous structures are unremarkable. IMPRESSION: Feeding tube with the metallic tip projecting over antrum of the stomach. Electronically Signed   By: Elige Ko   On: 04/10/2016 13:45     STUDIES:  Head Ct 6/2 >> extensive chronic ischemic changes, new acute / subacute L > R cerebellar infarcts, no mass effect or bleed  CT chest 6/2 >> no dissection, no PE, emphysematous changes CT abdomen/ pelvis 6/2 >> cholelithiasis without evidence obstruction, no other acute findings.  6/3 MRI Infarct left cerebellum, pons infarct, multiple embolic infarcts, rt vertebral artery occlusion   CULTURES: None  ANTIBIOTICS: Ceftriaxone 6/7 >. 6/9 cipro 6/9 >> (plan stop 6/13)  SIGNIFICANT EVENTS: Intubated 6/2 for airway protection 6/3 plasma phoresis  6/3 PRBC transfusion   LINES/TUBES: ETT 6/2 >> 6/8 Trach (JY) 6/8 >>  6/2 rt i j hd cath>>  DISCUSSION: 75 yo woman with HTN, mild dementia, hx CVA's admitted 6/2 with acute altered MS. Eval has revealed B cerebellar CVA's, mild acute renal failure, severe thrombocytopenia w schistocytes, NSTEMI. Suspect cerebellar and cardiac ischemia due to microvascular disease and pro-thrombotic state as opposed to macroembolism. Entire picture consistent w TTP, appears to be responding to plasmaphoresis. MRI confirms cerebellar CVA's, predicts poor prognosis  ASSESSMENT /  PLAN:  PULMONARY A: Acute respiratory failure due to inability to protect airway Presumed hx COPD given emphysema on Ct chest P:   Trach in place for airway protection Work to get to ATC 24x7 over the next 1-2 days Discussed status w son on several occasions. I attempted to be very clear about her poor prognosis for meaningful improvement. May need to also involve Palliative Care as we go forward - I predict that she will not clinically change even w longer support, will need to discuss withdrawal of care at some point.  Albuterol prn  CARDIOVASCULAR A:  Elevated troponin I, ? Due to microvascular thrombosis. Peaked at 5 and decreased to 3.9 on 6/3. Note focal wall motion abnormalities on TTE > hypokinesis of the apicalanteroseptal, anterior, and apical myocardium. Hx hypertension Atrial fib P:  D/c'd amiodarone 6/7 Hold anti-HTN regimen, restart as indicated Appreciate hematology recs > restarted ASA. Consider restart heparin this weekend. They recommend that we NOT restart plavix given its  association w TTP.  Consider cardiology eval of NSTEMI if she improves neuro status. Otherwise would defer any invasive workup  RENAL Lab Results  Component Value Date   CREATININE 1.11* 04/10/2016   CREATININE 1.19* 04/09/2016   CREATININE 1.19* 04/08/2016   CREATININE 1.27 12/01/2013   CREATININE 1.11 08/11/2012    Recent Labs Lab 04/08/16 0306 04/09/16 0521 04/10/16 0600  K 3.2* 4.3 4.3   A:   Acute renal failure, note S Cr 1.06 in 02/2016; improved P:   Follow BMP, electrolytes Foley to manage I/O's  GASTROINTESTINAL A:   Reported hx H pylori SUP P:   pepcid  TF's running Will likely need PEG next week  HEMATOLOGIC A:    Recent Labs  04/10/16 0600 04/11/16 0620  HGB 6.9* 8.1*    Severe thrombocytopenia, etiology unclear but note episode of thrombocytopenia during her hosp in 3/'17 as well, consider drug induced (amoxicillin/clarithromycin). Suspect TTP now given  response to plasmaphoresis Normocytic anemia P:  Hematology assistance appreciated Plasma phoresis 3 of 3 completed on 6/6 Transfused 1u PRBC 6/8 > appropriate response Consider hemolysis workup if Hgb drops further  INFECTIOUS A:   UTI, E coli, I to ceftriaxone, S to cipro P:   Change ceftriaxone to cipro 6/9 based on sensitivities Note possible rxn to amoxicillin or clarithromycin on presentation, can cause thrombocytopenia  ENDOCRINE A:   Hyperlipidemia    P:   Restart statin 6/9  NEUROLOGIC A:   Acute cerebellar CVA's, suspect contribution of thrombocytopenia and pro-thrombotic state Probable seizure activity noted 6/2 pm P:   RASS goal: 0 Continue keppra, started 6/2 Sedation per PAD protocol > will decrease once trach in place ASA restarted 6/6 EEG and MRI/A brain show significant injuries, poor prognosis for meaningful recovery   FAMILY  - Updates: Dr. Delton Coombes on 6/2, 6/5, 6/7 reviewed clinical status, organ injuries and prognosis with pt's brother at bedside. Explained that her age, TTP and prior CVa's make her prognosis for meaningful survival poor. They want her supported fully until we can determine the severity of her Acute CVA's, the potential for recovery.  Trach done 6/8. Suspect will likely need PEG next week.    - Inter-disciplinary family meet or Palliative Care meeting due by:  04/11/16  Independent CC time 35 minutes   Levy Pupa, MD, PhD 04/11/2016, 9:24 AM [facility] [phone] or if no answer [phone]

## 2016-04-11 NOTE — Evaluation (Signed)
Occupational Therapy Evaluation Patient Details Name: Montine CircleVera M Grewell MRN: [id number] DOB: Jul 29, 1941 Today's Date: 04/11/2016    History of Present Illness 75 y.o. female with a known history of Hypertension, history of stroke, dementia, history of edema of the leg. Was brought in by family due to patient having difficulty with walking.  MRI (+)  Large confluent acute nonhemorrhagic infarct within the left inferior cerebellum. Two small acute nonhemorrhagic infarcts within the pons. Multi focal punctate areas of acute nonhemorrhagic infarction over the convexities bilaterally, more prominent on the right. This appears to be in a watershed distribution. It embolic infarcts are considered less likely. More focal acute nonhemorrhagic infarct involving the left precentral gyrus measuring 15 mm along the primary motor cortex. Multiple remote lacunar infarcts of the cerebellum bilaterally. Extensive areas of encephalomalacia involving the frontal lobes   Clinical Impression   PT admitted with CVA. Pt currently with functional limitiations due to the deficits listed below (see OT problem list). PTA was living at home with family using cane and (A) for adls. Pt will benefit from skilled OT to increase their independence and safety with adls and balance to allow discharge SNF.     Follow Up Recommendations  SNF;Supervision/Assistance - 24 hour    Equipment Recommendations  Hospital bed;Wheelchair cushion (measurements OT);Wheelchair (measurements OT);Other (comment) (hoyer lift)    Recommendations for Other Services       Precautions / Restrictions Precautions Precautions: Fall Precaution Comments: increase risk for skin break down      Mobility Bed Mobility Overal bed mobility: +2 for physical assistance;Needs Assistance Bed Mobility: Supine to Sit;Sit to Supine     Supine to sit: Total assist;+2 for physical assistance Sit to supine: Total assist;+2 for physical assistance;+2 for  safety/equipment   General bed mobility comments: total (A) with full extension -  unsafe static sitting  Transfers                      Balance                                            ADL Overall ADL's : Needs assistance/impaired Eating/Feeding: NPO                                     General ADL Comments: total (A) for all adls     Vision Vision Assessment?:  (unable to understand)   Perception     Praxis      Pertinent Vitals/Pain Pain Assessment: No/denies pain     Hand Dominance  (unknown)   Extremity/Trunk Assessment Upper Extremity Assessment Upper Extremity Assessment: RUE deficits/detail;LUE deficits/detail RUE Deficits / Details: hand digit flexed unable to range digits, full extension tone present. OT unable to break tone at this evaluation even with position changes.  LUE Deficits / Details: full tension, elbow flexion with position change and tone present   Lower Extremity Assessment Lower Extremity Assessment: Defer to PT evaluation;RLE deficits/detail;LLE deficits/detail RLE Deficits / Details: tone full extension LLE Deficits / Details: tone full extension       Communication Communication Communication: Expressive difficulties   Cognition Arousal/Alertness: Awake/alert Behavior During Therapy: Flat affect Overall Cognitive Status: History of cognitive impairments - at baseline  General Comments       Exercises       Shoulder Instructions      Home Living Family/patient expects to be discharged to:: Skilled nursing facility                                 Additional Comments: from home with family, used a cane with R side residual deficits ( weakness noted in chart) All information noted in chart reviewed      Prior Functioning/Environment Level of Independence: Needs assistance    ADL's / Homemaking Assistance Needed: self feeds and toilets         OT Diagnosis: Generalized weakness;Cognitive deficits   OT Problem List: Decreased strength;Decreased range of motion;Decreased activity tolerance;Impaired balance (sitting and/or standing);Impaired vision/perception;Decreased coordination;Decreased cognition;Decreased safety awareness;Decreased knowledge of use of DME or AE;Decreased knowledge of precautions;Cardiopulmonary status limiting activity   OT Treatment/Interventions: Therapeutic activities;Self-care/ADL training;Therapeutic exercise;Neuromuscular education;DME and/or AE instruction;Cognitive remediation/compensation;Visual/perceptual remediation/compensation;Patient/family education;Balance training    OT Goals(Current goals can be found in the care plan section) Acute Rehab OT Goals OT Goal Formulation: Patient unable to participate in goal setting Potential to Achieve Goals: Poor  OT Frequency: Other (comment) (trial 1-2 )   Barriers to D/C:            Co-evaluation              End of Session Nurse Communication: Mobility status;Precautions  Activity Tolerance: Patient tolerated treatment well Patient left: in bed;with call bell/phone within reach;with bed alarm set;with SCD's reapplied   Time: 1610-9604 OT Time Calculation (min): 20 min Charges:  OT General Charges $OT Visit: 1 Procedure OT Evaluation $OT Eval High Complexity: 1 Procedure G-Codes:    Boone Master B 2016/04/28, 11:27 AM  [facility]   OTR/L Pager: [phone] Office: [phone] .

## 2016-04-12 ENCOUNTER — Inpatient Hospital Stay (HOSPITAL_COMMUNITY): Payer: Medicare Other

## 2016-04-12 DIAGNOSIS — I10 Essential (primary) hypertension: Secondary | ICD-10-CM

## 2016-04-12 DIAGNOSIS — Z930 Tracheostomy status: Secondary | ICD-10-CM

## 2016-04-12 LAB — BASIC METABOLIC PANEL
ANION GAP: 6 (ref 5–15)
BUN: 21 mg/dL — AB (ref 6–20)
CHLORIDE: 104 mmol/L (ref 101–111)
CO2: 29 mmol/L (ref 22–32)
Calcium: 8.7 mg/dL — ABNORMAL LOW (ref 8.9–10.3)
Creatinine, Ser: 1.1 mg/dL — ABNORMAL HIGH (ref 0.44–1.00)
GFR calc Af Amer: 57 mL/min — ABNORMAL LOW (ref >60)
GFR calc non Af Amer: 49 mL/min — ABNORMAL LOW (ref >60)
Glucose, Bld: 128 mg/dL — ABNORMAL HIGH (ref 65–99)
POTASSIUM: 3.4 mmol/L — AB (ref 3.5–5.1)
SODIUM: 139 mmol/L (ref 135–145)

## 2016-04-12 LAB — GLUCOSE, CAPILLARY
GLUCOSE-CAPILLARY: 129 mg/dL — AB (ref 65–99)
Glucose-Capillary: 130 mg/dL — ABNORMAL HIGH (ref 65–99)
Glucose-Capillary: 108 mg/dL — ABNORMAL HIGH (ref 65–99)
Glucose-Capillary: 103 mg/dL — ABNORMAL HIGH (ref 65–99)

## 2016-04-12 NOTE — Progress Notes (Signed)
PULMONARY / CRITICAL CARE MEDICINE   Name: Laura Levy MRN: [id number] DOB: 02-23-1941    ADMISSION DATE:  04/27/2016 CONSULTATION DATE:  04/23/2016   REFERRING MD:  Dr Elvera LennoxGherghe  CHIEF COMPLAINT:  Altermed MS and thrombocytopenia   HISTORY OF PRESENT ILLNESS:   Hx is obtained from notes and family, pt unable to give hx  75 y.o. female with medical history significant of hypertension, prior strokes, dementia, presented to [facility] emergency room 6/2 with chief complaint of altered mental status, slurred speech beginning the evening prior. CT scan showed subacute versus acute stroke bilateral cerebellum. 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 [facility], has experienced progressive decline in wakefulness and airway protection. She was then noted to have rhythmic movements consistent w seizure activity, better after ativan. She moved to ICU and CCM  consulted to assist with her care.   SUBJECTIVE:  No more awake than last 4 days Attempting TC, failed and was placed back on vent now back to TC but RR is in the 40's.  VITAL SIGNS: BP 134/70 mmHg  Pulse 106  Temp(Src) 99.4 F (37.4 C) (Axillary)  Resp 18  Ht 5\' 3"  (1.6 m)  Wt 70.5 kg (155 lb 6.8 oz)  BMI 27.54 kg/m2  SpO2 100%  HEMODYNAMICS: CVP:  [10 mmHg-14 mmHg] 14 mmHg  VENTILATOR SETTINGS: Vent Mode:  [-]  FiO2 (%):  [28 %-40 %] 28 %  INTAKE / OUTPUT: I/O last 3 completed shifts: In: 2717.4 [I.V.:239.6; Other:90; NG/GT:1557.8; IV Piggyback:830] Out: 3950 [Urine:3950]  PHYSICAL EXAMINATION: General:  Ill appearing elderly woman, severe kyphosis Neuro:  Some grimace with pain, will not react to voice, no cough, leftward gaze HEENT:  Trach in place, NGT in place Cardiovascular:  Regular, no M Lungs:  Coarse BS B, no wheezes.  Abdomen:  Soft, NT, + BS Musculoskeletal:  No LE edema Skin:  Some tenting, no rash.    LABS:  BMET  Recent Labs Lab 04/09/16 0521 04/10/16 0600 04/12/16 0433  NA 140 138 139  K 4.3 4.3 3.4*  CL 107 104 104  CO2 26 27 29   BUN 12 12 21*  CREATININE 1.19* 1.11* 1.10*  GLUCOSE 113* 125* 128*    Electrolytes  Recent Labs Lab 04/08/16 0518 04/08/16 1657 04/09/16 0521 04/10/16 0600 04/12/16 0433  CALCIUM  --   --  8.0* 8.6* 8.7*  MG 1.8 1.7 1.7  --   --   PHOS 3.7 4.3 3.6  --   --     CBC  Recent Labs Lab 04/08/16 0306 04/10/16 0600 04/11/16 0620  WBC 18.2* 16.0* 13.8*  HGB 8.5* 6.9* 8.1*  HCT 26.6* 21.8* 25.7*  PLT 239 359 299    Coag's  Recent Labs Lab 04/06/16 0411 04/10/16 0233  APTT 24 34  INR 1.27 1.21    Sepsis Markers No results for input(s): LATICACIDVEN, PROCALCITON, O2SATVEN in the last 168 hours.  ABG No results for input(s): PHART, PCO2ART, PO2ART in the last 168 hours.  Liver Enzymes  Recent Labs Lab 04/06/16 0411 04/10/16 0600  AST 40 32  ALT 19 21  ALKPHOS 51 70  BILITOT 2.1* 0.8  ALBUMIN 2.7* 2.5*    Cardiac Enzymes  Recent Labs Lab 04/07/16 1545 04/07/16 2128 04/08/16 0306  TROPONINI 3.23* 3.70* 3.67*    Glucose  Recent Labs Lab 04/11/16 1109 04/11/16 1512 04/11/16 1924 04/11/16 2338 04/12/16 0409 04/12/16 0802  GLUCAP 120*  118* 116* 151* 130* 129*    Imaging Dg Abd Portable 1v  04/12/2016  CLINICAL DATA:  75 year old female with enteric tube placement. EXAM: PORTABLE ABDOMEN - 1 VIEW COMPARISON:  Radiograph dated 06/08 7 FINDINGS: An enteric tube is noted with tip over the gastric bubble in the proximal stomach. There is moderate air distention of the stomach, new from prior study. Gastric outlet obstruction is not excluded. Clinical correlation is recommended. Moderate stool throughout the colon. There is no evidence of small-bowel obstruction. No free air noted. No radiopaque calculi. There is bibasilar atelectatic changes of the lungs. There is degenerative changes of the spine. No  acute fracture. IMPRESSION: Enteric tube within the stomach. Moderate air distention of the stomach progressed compared to the prior study. Clinical correlation is recommended. Electronically Signed   By: Elgie Collard M.D.   On: 04/12/2016 01:42     STUDIES:  Head Ct 6/2 >> extensive chronic ischemic changes, new acute / subacute L > R cerebellar infarcts, no mass effect or bleed  CT chest 6/2 >> no dissection, no PE, emphysematous changes CT abdomen/ pelvis 6/2 >> cholelithiasis without evidence obstruction, no other acute findings.  6/3 MRI Infarct left cerebellum, pons infarct, multiple embolic infarcts, rt vertebral artery occlusion   CULTURES: None  ANTIBIOTICS: Ceftriaxone 6/7 >. 6/9 cipro 6/9 >> (plan stop 6/13)  SIGNIFICANT EVENTS: Intubated 6/2 for airway protection 6/3 plasma phoresis  6/3 PRBC transfusion   LINES/TUBES: ETT 6/2 >> 6/8 Trach (JY) 6/8 >>  6/2 rt i j hd cath>>  DISCUSSION: 75 yo woman with HTN, mild dementia, hx CVA's admitted 6/2 with acute altered MS. Eval has revealed B cerebellar CVA's, mild acute renal failure, severe thrombocytopenia w schistocytes, NSTEMI. Suspect cerebellar and cardiac ischemia due to microvascular disease and pro-thrombotic state as opposed to macroembolism. Entire picture consistent w TTP, appears to be responding to plasmaphoresis. 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 Discussed status w son on several occasions. Dr. Delton Coombes attempted to be very clear about her poor prognosis for meaningful improvement. May need to also involve Palliative Care as we go forward - Dr Delton Coombes predicts that she will not  clinically change even w longer support, will need to discuss withdrawal of care at some point and I agree. Albuterol prn  CARDIOVASCULAR A:  Elevated troponin I, ? Due to microvascular thrombosis. Peaked at 5 and decreased to 3.9 on 6/3. Note focal wall motion abnormalities on TTE > hypokinesis of the apicalanteroseptal, anterior, and apical myocardium. Hx hypertension Atrial fib P:  D/c'd amiodarone 6/7 Hold anti-HTN regimen, restart as indicated Appreciate hematology recs > restarted ASA.  They recommend that we NOT restart plavix given its association w TTP.  Consider cardiology eval of NSTEMI if she improves neuro status. Otherwise would defer any invasive workup ?when to start heparin, will defer to hematology.  RENAL Lab Results  Component Value Date   CREATININE 1.10* 04/12/2016   CREATININE 1.11* 04/10/2016   CREATININE 1.19* 04/09/2016   CREATININE 1.27 12/01/2013   CREATININE 1.11 08/11/2012    Recent Labs Lab 04/09/16 0521 04/10/16 0600 04/12/16 0433  K 4.3 4.3 3.4*   A:  Acute renal failure, note S Cr 1.06 in 02/2016; improved P:   Follow BMP, electrolytes Foley to manage I/O's Replace electrolytes as indicated. KVO IVF.  GASTROINTESTINAL A:   Reported hx H pylori SUP P:   Pepcid  TF's running Will need PEG next week prior to placement.  HEMATOLOGIC A:    Recent Labs  04/10/16 0600 04/11/16 0620  HGB 6.9* 8.1*    Severe thrombocytopenia, etiology unclear but note episode of thrombocytopenia during her hosp in 3/'17 as well, consider drug induced (amoxicillin/clarithromycin). Suspect TTP now given response to plasmaphoresis Normocytic anemia P:  Hematology assistance appreciated Plasma phoresis 3 of 3 completed on 6/6 Transfused 1u PRBC 6/8 > appropriate response Consider hemolysis workup if Hgb drops further ?when to start heparin.  INFECTIOUS A:   UTI, E coli, I to ceftriaxone, S to cipro P:   Change ceftriaxone to cipro 6/9 based  on sensitivities Note possible rxn to amoxicillin or clarithromycin on presentation, can cause thrombocytopenia  ENDOCRINE A:   Hyperlipidemia    P:   Restarted statin 6/9  NEUROLOGIC A:   Acute cerebellar CVA's, suspect contribution of thrombocytopenia and pro-thrombotic state Probable seizure activity noted 6/2 pm P:   RASS goal: 0 Continue keppra, started 6/2 Sedation per PAD protocol > will decrease once trach in place ASA restarted 6/6 EEG and MRI/A brain show significant injuries, poor prognosis for meaningful recovery  FAMILY  - Updates: Dr. Delton Coombes on 6/2, 6/5, 6/7 reviewed clinical status, organ injuries and prognosis with pt's brother at bedside. Explained that her age, TTP and prior CVa's make her prognosis for meaningful survival poor. They want her supported fully until we can determine the severity of her Acute CVA's, the potential for recovery.  Trach done 6/8. Suspect will likely need PEG next week.   No family bedside 39/10.  - Inter-disciplinary family meet or Palliative Care meeting due by:  04/11/16  The patient is critically ill with multiple organ systems failure and requires high complexity decision making for assessment and support, frequent evaluation and titration of therapies, application of advanced monitoring technologies and extensive interpretation of multiple databases.   Critical Care Time devoted to patient care services described in this note is  37  Minutes. This time reflects time of care of this signee Dr Koren Bound. This critical care time does not reflect procedure time, or teaching time or supervisory time of PA/NP/Med student/Med Resident etc but could involve care discussion time.  Alyson Reedy, M.D. [facility] Care Medicine. Pager: [phone]. After hours pager: [phone].

## 2016-04-13 DIAGNOSIS — N183 Chronic kidney disease, stage 3 (moderate): Secondary | ICD-10-CM

## 2016-04-13 DIAGNOSIS — G9340 Encephalopathy, unspecified: Secondary | ICD-10-CM

## 2016-04-13 LAB — GLUCOSE, CAPILLARY
GLUCOSE-CAPILLARY: 133 mg/dL — AB (ref 65–99)
GLUCOSE-CAPILLARY: 118 mg/dL — AB (ref 65–99)
GLUCOSE-CAPILLARY: 129 mg/dL — AB (ref 65–99)
Glucose-Capillary: 110 mg/dL — ABNORMAL HIGH (ref 65–99)
Glucose-Capillary: 123 mg/dL — ABNORMAL HIGH (ref 65–99)

## 2016-04-13 LAB — MAGNESIUM: Magnesium: 1.8 mg/dL (ref 1.7–2.4)

## 2016-04-13 LAB — BASIC METABOLIC PANEL
ANION GAP: 7 (ref 5–15)
BUN: 24 mg/dL — ABNORMAL HIGH (ref 6–20)
CHLORIDE: 103 mmol/L (ref 101–111)
CO2: 28 mmol/L (ref 22–32)
Calcium: 8.8 mg/dL — ABNORMAL LOW (ref 8.9–10.3)
Creatinine, Ser: 1.14 mg/dL — ABNORMAL HIGH (ref 0.44–1.00)
GFR calc non Af Amer: 47 mL/min — ABNORMAL LOW (ref >60)
GFR, EST AFRICAN AMERICAN: 54 mL/min — AB (ref >60)
GLUCOSE: 131 mg/dL — AB (ref 65–99)
Potassium: 3.1 mmol/L — ABNORMAL LOW (ref 3.5–5.1)
Sodium: 138 mmol/L (ref 135–145)

## 2016-04-13 LAB — CBC
HEMATOCRIT: 22.4 % — AB (ref 36.0–46.0)
HEMOGLOBIN: 7.3 g/dL — AB (ref 12.0–15.0)
MCH: 28.7 pg (ref 26.0–34.0)
MCHC: 32.6 g/dL (ref 30.0–36.0)
MCV: 88.2 fL (ref 78.0–100.0)
RBC: 2.54 MIL/uL — AB (ref 3.87–5.11)
RDW: 17.3 % — ABNORMAL HIGH (ref 11.5–15.5)
WBC: 14.1 10*3/uL — ABNORMAL HIGH (ref 4.0–10.5)

## 2016-04-13 LAB — PLATELET COUNT: PLATELETS: 15 10*3/uL — AB (ref 150–400)

## 2016-04-13 LAB — PHOSPHORUS: PHOSPHORUS: 1.8 mg/dL — AB (ref 2.5–4.6)

## 2016-04-13 MED ORDER — POTASSIUM CHLORIDE 20 MEQ/15ML (10%) PO SOLN
40.0000 meq | Freq: Once | ORAL | Status: AC
  Administered 2016-04-13: 40 meq
  Filled 2016-04-13: qty 30

## 2016-04-13 MED ORDER — POTASSIUM PHOSPHATES 15 MMOLE/5ML IV SOLN
30.0000 mmol | Freq: Once | INTRAVENOUS | Status: AC
  Administered 2016-04-13: 30 mmol via INTRAVENOUS
  Filled 2016-04-13: qty 10

## 2016-04-13 NOTE — Progress Notes (Signed)
Went to patient's room to check on her and noticed patient not breathing.  Code blue called immediately.  Emergency protocol activated immediately.  Patient circulation restored and bed obtained on .

## 2016-04-13 NOTE — Progress Notes (Signed)
eLink Physician-Brief Progress Note Patient Name: Laura Levy DOB: [birth year] MRN: [id number]   Date of Service  04/13/2016  HPI/Events of Note  Called by bedside nurse who reports that the patient vomited and is now diaphoretic and has increased RR. Patient is in an unmonitored room. I can not camera into the room nor can I pull up to [facility] bedside vitals.    eICU Interventions  Dr. Zonia KiefStephens, who is the bedside coverage, called to see the patient. She is currently tied up with a patient who is bradycardic and hypotensive. She will see the patient as soon as she is able.     Intervention Category Minor Interventions: Communication with other healthcare providers and/or family  Laura Levy,Laura Levy 04/13/2016, 11:49 PM

## 2016-04-13 NOTE — Progress Notes (Signed)
eLink Physician-Brief Progress Note Patient Name: Montine CircleVera M Wiens DOB: [birth year] MRN: [id number]   Date of Service  04/13/2016  HPI/Events of Note  Hypokalemia and hypophosphatemia  eICU Interventions  Potassium and Phos replaced     Intervention Category Intermediate Interventions: Electrolyte abnormality - evaluation and management  DETERDING,ELIZABETH 04/13/2016, 6:00 AM

## 2016-04-13 NOTE — Progress Notes (Addendum)
Called to bedside for probable aspiration event with ensuing cardiac arrest. Unknown initial rhythm. Two rounds of CPR / epi and ROSC. On my arrival, patient with pulse and able to be bagged via existing trach.   Plan:  Transfer to ICU [facility] sent; follow up labs Vent support Hold on antibiotics for now Monitor for signs and symptoms of bleeding given marked thrombocytopenia  Nita SickleSarah Ellen E. Stephens, MD Pulmonary and Critical Care 04/13/2016 11:54 PM   ADDENDUM: Upon transfer to 68M, patient again had a PEA arrest. 2 rounds of CPR/EPI and return of pulses was noted. No clear inciting event, although she did reportedly brady down prior to losing pulses. Labs have yet to result. Plan remains as above.   Nita SickleSarah Ellen E. Stephens, MD Pulmonary and Critical Care 04/17/2016 12:30 AM    ADDENDUM: Patient experienced multiple additional PEA arrests despite Epi gtt, additional fluid boluses and bicarb. Family was present for the 5th and 6th coding events. During the 6th, her son requested that resuscitation efforts be stopped. A hospital chaplain was present and provided additional support. Our deepest condolences were communicated.   Final exam:  Unresponsive, pulses absent, no spontaneous respiratory effort, no heart tones.  Time of death: 0142 AM  Nita SickleSarah Ellen E. Stephens, MD Pulmonary and Critical Care 04/23/2016 1:52 AM   The patient is critically ill with multiple organ system failure and requires high complexity decision making for assessment and support, frequent evaluation and titration of therapies, advanced monitoring, review of radiographic studies and interpretation of complex data.   Critical Care Time devoted to patient care services, exclusive of separately billable procedures, described in this note is 142  minutes.

## 2016-04-13 NOTE — Progress Notes (Signed)
Pt arrived to 5C08 via bed.  Non verbal, withdraws to pain.  Will continue to monitor.  Sondra ComeSilva, Blaise Palladino M, RN

## 2016-04-13 NOTE — Progress Notes (Signed)
CODE BLUE NOTE  Patient Name: Laura Levy   MRN: [id number]   Date of Birth/ Sex: 11/02/1941 , female      Admission Date: 04/03/2016  Attending Provider: Nelda Bucksaniel J Feinstein, MD  Primary Diagnosis: <principal problem not specified>    Indication: Pt was in her usual state of health until this PM, when she was noted to be have vomited and possible aspirated when she received her medications. She was noted to be diaphoretic by her nurse. Her initial rhythm was asystole when checked. Code blue was subsequently called. At the time of arrival on scene, ACLS protocol was underway.    Technical Description:  - CPR performance duration:  6 minutes  - Was defibrillation or cardioversion used? No   - Was external pacer placed? Yes  - Was patient intubated pre/post CPR? Patient had a trach in place     Medications Administered: Y = Yes; Blank = No Amiodarone    Atropine    Calcium    Epinephrine  Y x 2  Lidocaine    Magnesium    Norepinephrine    Phenylephrine    Sodium bicarbonate  Y x2  Vasopressin      Post CPR evaluation:  - Final Status - Was patient successfully resuscitated ? Yes - What is current rhythm? Sinus tachycardia - What is current hemodynamic status? stable   Miscellaneous Information:  - Labs sent, including: basic labs   - Primary team notified?  Yes  - Family Notified? No  - Additional notes/ transfer status: CCM took over care         Laura HolterKanishka G Gunadasa, MD  04/13/2016, 11:50 PM

## 2016-04-13 NOTE — Progress Notes (Signed)
PULMONARY / CRITICAL CARE MEDICINE   Name: Laura Levy MRN: [id number] DOB: [birth year]    ADMISSION DATE:  05/02/2016 CONSULTATION DATE:  04/29/2016   REFERRING MD:  Dr Elvera LennoxGherghe  CHIEF COMPLAINT:  Altermed MS and thrombocytopenia   HISTORY OF PRESENT ILLNESS:   Hx is obtained from notes and family, pt unable to give hx  75 y.o. female with medical history significant of hypertension, prior strokes, dementia, presented [facility] room 6/2 with chief complaint of altered mental status, slurred speech beginning the evening prior. CT scan showed subacute versus acute stroke bilateral cerebellum. 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 [facility], has experienced progressive decline in wakefulness and airway protection. She was then noted to have rhythmic movements consistent w seizure activity, better after ativan. She moved to ICU and CCM  consulted to assist with her care.   SUBJECTIVE:  Remains completely unresponsive.  Off sedation x96 hours.  VITAL SIGNS: BP 110/69 mmHg  Pulse 97  Temp(Src) 100.8 F (38.2 C) (Axillary)  Resp 32  Ht 5\' 3"  (1.6 m)  Wt 70.1 kg (154 lb 8.7 oz)  BMI 27.38 kg/m2  SpO2 100%  HEMODYNAMICS:    VENTILATOR SETTINGS: Vent Mode:  [-]  FiO2 (%):  [28 %] 28 %  INTAKE / OUTPUT: I/O last 3 completed shifts: In: 2188.8 [Other:170; NG/GT:963.8; IV Piggyback:1055] Out: 3390 [Urine:3390]  PHYSICAL EXAMINATION: General:  Ill appearing elderly woman, severe kyphosis Neuro:  Some grimace with pain but otherwise completely unresponsive. HEENT:  Trach in place, NGT in place Cardiovascular:  Regular, no M Lungs:  Coarse BS B, no wheezes.  Abdomen:  Soft, NT, + BS Musculoskeletal:  No LE edema Skin:  Some tenting, no rash.   LABS:  BMET  Recent Labs Lab 04/10/16 0600 04/12/16 0433 04/13/16 0500  NA 138 139 138  K 4.3 3.4* 3.1*  CL 104 104 103   CO2 27 29 28   BUN 12 21* 24*  CREATININE 1.11* 1.10* 1.14*  GLUCOSE 125* 128* 131*    Electrolytes  Recent Labs Lab 04/08/16 1657 04/09/16 0521 04/10/16 0600 04/12/16 0433 04/13/16 0500  CALCIUM  --  8.0* 8.6* 8.7* 8.8*  MG 1.7 1.7  --   --  1.8  PHOS 4.3 3.6  --   --  1.8*    CBC  Recent Labs Lab 04/10/16 0600 04/11/16 0620 04/13/16 0500 04/13/16 0830  WBC 16.0* 13.8* 14.1*  --   HGB 6.9* 8.1* 7.3*  --   HCT 21.8* 25.7* 22.4*  --   PLT 359 299  --  15*    Coag's  Recent Labs Lab 04/10/16 0233  APTT 34  INR 1.21    Sepsis Markers No results for input(s): LATICACIDVEN, PROCALCITON, O2SATVEN in the last 168 hours.  ABG No results for input(s): PHART, PCO2ART, PO2ART in the last 168 hours.  Liver Enzymes  Recent Labs Lab 04/10/16 0600  AST 32  ALT 21  ALKPHOS 70  BILITOT 0.8  ALBUMIN 2.5*    Cardiac Enzymes  Recent Labs Lab 04/07/16 1545 04/07/16 2128 04/08/16 0306  TROPONINI 3.23* 3.70* 3.67*    Glucose  Recent Labs Lab 04/12/16 0409 04/12/16 0802 04/12/16 1604 04/12/16 1944 04/13/16 0410 04/13/16 0744  GLUCAP 130* 129* 108* 103* 133* 118*    Imaging Dg Abd Portable 1v  04/12/2016  CLINICAL DATA:  stat for NG tube placement EXAM: PORTABLE ABDOMEN - 1  VIEW COMPARISON:  04/12/2016 FINDINGS: NG tube over the stomach. Decreased gastric distention. Numerous loops of mildly prominent small bowel again identified but there is gas into the distal colon as well. IMPRESSION: NG tube projects over the stomach. Several loops of mildly distended central small bowel noted, but gas is also seen into the distal colon. Correlate clinically. Electronically Signed   By: Esperanza Heir M.D.   On: 04/12/2016 20:40     STUDIES:  Head Ct 6/2 >> extensive chronic ischemic changes, new acute / subacute L > R cerebellar infarcts, no mass effect or bleed  CT chest 6/2 >> no dissection, no PE, emphysematous changes CT abdomen/ pelvis 6/2 >>  cholelithiasis without evidence obstruction, no other acute findings.  6/3 MRI Infarct left cerebellum, pons infarct, multiple embolic infarcts, rt vertebral artery occlusion   CULTURES: None  ANTIBIOTICS: Ceftriaxone 6/7 >. 6/9 cipro 6/9 >> (plan stop 6/13)  SIGNIFICANT EVENTS: Intubated 6/2 for airway protection 6/3 plasma phoresis  6/3 PRBC transfusion   LINES/TUBES: ETT 6/2 >> 6/8 Trach (JY) 6/8 >>  6/2 rt i j hd cath>>  I reviewed CXR myself, trach in good position.  DISCUSSION: 74 yo woman with HTN, mild dementia, hx CVA's admitted 6/2 with acute altered MS. Eval has revealed B cerebellar CVA's, mild acute renal failure, severe thrombocytopenia w schistocytes, NSTEMI. Suspect cerebellar and cardiac ischemia due to microvascular disease and pro-thrombotic state as opposed to macroembolism. Entire picture consistent w TTP, appears to be responding to plasmaphoresis. 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 Discussed status w son on several occasions. Dr. Delton Coombes attempted to be very clear about her poor prognosis for meaningful improvement. May need to also involve Palliative Care as we go forward - Dr Delton Coombes predicts that she will not clinically change even w longer support, will need to discuss withdrawal of care at some point and I agree. Albuterol prn Transfer to tele. Consult case management for placement.  CARDIOVASCULAR A:  Elevated troponin I, ? Due to microvascular thrombosis. Peaked at 5 and decreased to 3.9 on 6/3. Note focal wall motion abnormalities on TTE > hypokinesis of the apicalanteroseptal, anterior, and apical myocardium. Hx  hypertension Atrial fib P:  D/c'd amiodarone 6/7 Hold anti-HTN regimen, restart as indicated Appreciate hematology recs > restarted ASA.  They recommend that we NOT restart plavix given its association w TTP.  Doubt cards with cath given neuro status.  RENAL Lab Results  Component Value Date   CREATININE 1.14* 04/13/2016   CREATININE 1.10* 04/12/2016   CREATININE 1.11* 04/10/2016   CREATININE 1.27 12/01/2013   CREATININE 1.11 08/11/2012    Recent Labs Lab 04/10/16 0600 04/12/16 0433 04/13/16 0500  K 4.3 3.4* 3.1*   A:   Acute renal failure, note S Cr 1.06 in 02/2016; improved P:   Follow BMP, electrolytes Foley to manage I/O's Replace electrolytes as indicated. KVO IVF.  GASTROINTESTINAL A:   Reported hx H pylori SUP P:   Pepcid  TF's running through NGT Son OK with PEG, needs to be arranged this week to assist with placement.  HEMATOLOGIC A:    Recent Labs  04/11/16 0620 04/13/16 0500  HGB 8.1* 7.3*    Severe thrombocytopenia, etiology unclear but note episode of thrombocytopenia during her hosp in 3/'17 as well, consider drug induced (amoxicillin/clarithromycin). Suspect TTP now given response to plasmaphoresis Normocytic anemia P:  Hematology assistance appreciated Plasma phoresis 3 of 3 completed on 6/6 Transfused 1u PRBC 6/8 > appropriate response Consider hemolysis workup if Hgb drops further ?when to start heparin. Platelet down to 15, hematology to address, no evidence of bleeding.  INFECTIOUS A:   UTI, E coli, I to ceftriaxone, S to cipro P:   Change ceftriaxone to cipro 6/9 based on sensitivities Note possible rxn to amoxicillin or clarithromycin on presentation, can cause thrombocytopenia  ENDOCRINE A:   Hyperlipidemia    P:   Restarted statin 6/9  NEUROLOGIC A:   Acute cerebellar CVA's, suspect contribution of thrombocytopenia and pro-thrombotic state Probable seizure activity noted 6/2 pm P:   RASS goal: 0 Continue keppra,  started 6/2 D/C all sedation. ASA restarted 6/6 EEG and MRI/A brain show significant injuries, poor prognosis for meaningful recovery  FAMILY  - Updates: Dr. Delton Coombes on 6/2, 6/5, 6/7 reviewed clinical status, organ injuries and prognosis with pt's brother at bedside. Explained that her age, TTP and prior CVa's make her prognosis for meaningful survival poor. They want her supported fully until we can determine the severity of her Acute CVA's, the potential for recovery.  Trach done 6/8. Suspect will likely need PEG next week.   No family bedside 12/10.  - Inter-disciplinary family meet or Palliative Care meeting due by:  04/11/16  Discussed with bedside RN and TRH-MD, transfer to tele and to [facility] service with PCCM following as consult for trach management.  Alyson Reedy, M.D. [facility] Pulmonary/Critical Care Medicine. Pager: [phone]. After hours pager: [phone].

## 2016-04-13 NOTE — Progress Notes (Signed)
Called MD to notify that patient not looking good.  Patient diaphoretic and vomited copious amount of emesis.  Patient VSS stable however.  MD stated he would send the in house staff to see patient.

## 2016-04-14 ENCOUNTER — Inpatient Hospital Stay (HOSPITAL_COMMUNITY): Payer: Medicare Other

## 2016-04-14 DIAGNOSIS — I469 Cardiac arrest, cause unspecified: Secondary | ICD-10-CM

## 2016-04-14 LAB — COMPREHENSIVE METABOLIC PANEL
ALBUMIN: 1.9 g/dL — AB (ref 3.5–5.0)
ALT: 118 U/L — AB (ref 14–54)
AST: 211 U/L — AB (ref 15–41)
Alkaline Phosphatase: 94 U/L (ref 38–126)
BUN: 20 mg/dL (ref 6–20)
CALCIUM: 6.3 mg/dL — AB (ref 8.9–10.3)
CREATININE: 1.21 mg/dL — AB (ref 0.44–1.00)
Chloride: 85 mmol/L — ABNORMAL LOW (ref 101–111)
GFR calc Af Amer: 51 mL/min — ABNORMAL LOW (ref >60)
GFR, EST NON AFRICAN AMERICAN: 44 mL/min — AB (ref >60)
Glucose, Bld: 190 mg/dL — ABNORMAL HIGH (ref 65–99)
Potassium: 4.6 mmol/L (ref 3.5–5.1)
Sodium: >180 mmol/L (ref 135–145)
TOTAL PROTEIN: 4.1 g/dL — AB (ref 6.5–8.1)
Total Bilirubin: 1.9 mg/dL — ABNORMAL HIGH (ref 0.3–1.2)

## 2016-04-14 LAB — CBC WITH DIFFERENTIAL/PLATELET
BAND NEUTROPHILS: 0 %
BASOS ABS: 0 10*3/uL (ref 0.0–0.1)
BLASTS: 0 %
Basophils Relative: 0 %
EOS ABS: 0.4 10*3/uL (ref 0.0–0.7)
Eosinophils Relative: 2 %
HCT: 15.5 % — ABNORMAL LOW (ref 36.0–46.0)
HEMOGLOBIN: 5.1 g/dL — AB (ref 12.0–15.0)
Lymphocytes Relative: 21 %
Lymphs Abs: 4.4 10*3/uL — ABNORMAL HIGH (ref 0.7–4.0)
MCH: 28.8 pg (ref 26.0–34.0)
MCHC: 32.9 g/dL (ref 30.0–36.0)
MCV: 87.6 fL (ref 78.0–100.0)
METAMYELOCYTES PCT: 0 %
MONO ABS: 0.4 10*3/uL (ref 0.1–1.0)
MYELOCYTES: 0 %
Monocytes Relative: 2 %
Neutro Abs: 15.7 10*3/uL — ABNORMAL HIGH (ref 1.7–7.7)
Neutrophils Relative %: 75 %
Other: 0 %
PLATELETS: 7 10*3/uL — AB (ref 150–400)
PROMYELOCYTES ABS: 0 %
RBC: 1.77 MIL/uL — ABNORMAL LOW (ref 3.87–5.11)
RDW: 18.2 % — ABNORMAL HIGH (ref 11.5–15.5)
WBC: 20.9 10*3/uL — ABNORMAL HIGH (ref 4.0–10.5)
nRBC: 0 /100 WBC

## 2016-04-14 LAB — BLOOD GAS, ARTERIAL
ACID-BASE DEFICIT: 15.3 mmol/L — AB (ref 0.0–2.0)
BICARBONATE: 12.3 meq/L — AB (ref 20.0–24.0)
Drawn by: 43707
FIO2: 100
LHR: 18 {breaths}/min
MECHVT: 500 mL
O2 Saturation: 99 %
PEEP/CPAP: 5 cmH2O
PH ART: 7.111 — AB (ref 7.350–7.450)
Patient temperature: 98.6
TCO2: 13.5 mmol/L (ref 0–100)
pCO2 arterial: 40.4 mmHg (ref 35.0–45.0)
pO2, Arterial: 241 mmHg — ABNORMAL HIGH (ref 80.0–100.0)

## 2016-04-14 LAB — DIC (DISSEMINATED INTRAVASCULAR COAGULATION)PANEL
D-Dimer, Quant: 14.84 ug/mL-FEU — ABNORMAL HIGH (ref 0.00–0.50)
Fibrinogen: 325 mg/dL (ref 204–475)
Platelets: 9 10*3/uL — CL (ref 150–400)
Prothrombin Time: 24.2 seconds — ABNORMAL HIGH (ref 11.6–15.2)

## 2016-04-14 LAB — GLUCOSE, CAPILLARY
GLUCOSE-CAPILLARY: 118 mg/dL — AB (ref 65–99)
GLUCOSE-CAPILLARY: 125 mg/dL — AB (ref 65–99)

## 2016-04-14 LAB — DIC (DISSEMINATED INTRAVASCULAR COAGULATION) PANEL
APTT: 45 s — AB (ref 24–37)
INR: 2.19 — AB (ref 0.00–1.49)
SMEAR REVIEW: NONE SEEN

## 2016-04-14 LAB — TYPE AND SCREEN
ABO/RH(D): AB POS
Antibody Screen: NEGATIVE

## 2016-04-14 LAB — TROPONIN I: TROPONIN I: 2.79 ng/mL — AB (ref <0.031)

## 2016-04-14 LAB — PATHOLOGIST SMEAR REVIEW

## 2016-04-14 LAB — PREPARE RBC (CROSSMATCH)

## 2016-04-14 LAB — LACTIC ACID, PLASMA: LACTIC ACID, VENOUS: 14.5 mmol/L — AB (ref 0.5–2.0)

## 2016-04-14 LAB — ERYTHROPOIETIN: ERYTHROPOIETIN: 58.1 m[IU]/mL — AB (ref 2.6–18.5)

## 2016-04-14 MED ORDER — SODIUM CHLORIDE 0.9 % IV SOLN: Freq: Once | INTRAVENOUS | Status: DC

## 2016-04-14 MED ORDER — EPINEPHRINE HCL 1 MG/ML IJ SOLN
0.5000 ug/min | INTRAMUSCULAR | Status: DC
  Filled 2016-04-14 (×2): qty 4

## 2016-04-17 ENCOUNTER — Telehealth: Payer: Self-pay

## 2016-04-17 NOTE — Telephone Encounter (Signed)
On 04/17/2016 I received a death certificate from [facility] [employer] (original). The death certificate is for burial. The patient is a patient of Doctor Molli KnockYacoub. The [facility].  On 04/21/2016 I received the death certificate back from Doctor Molli KnockYacoub. I got the death certificate ready and called the funeral home to let them know the death certificate is ready for pickup.

## 2016-05-03 NOTE — Progress Notes (Signed)
Placed patient on previous vent settings (VT 420 and rate of 14)

## 2016-05-03 NOTE — Code Documentation (Signed)
0010- Pt  Bradycardia to PEA, code activated, please see code documentation sheet for medication. Resuscitated at 0019. 0032-Pt  Bradycardia to PEA, code activated, please see code documentation sheet for medication. Resuscitated at 0036. Family notified and stated they were on their way. MD aware. Epinephrine gtt started.  [id number] Pt  Bradycardia to PEA, code activated, please see code documentation sheet for medication. Resuscitated at 0105 0113-Pt  Bradycardia to PEA, code activated, please see code documentation sheet for medication. Resuscitated at 0121 0137-Pt  Bradycardia to PEA, code activated, please see code documentation sheet for medication. Family requested all measures stopped. Time of death [id number].  Laura FeathersFrazer, Laura Lacomb RN BSN

## 2016-05-03 NOTE — Progress Notes (Signed)
ABG results given to MD Zonia KiefStephens

## 2016-05-03 NOTE — Progress Notes (Signed)
   04/29/2016 0200  Clinical Encounter Type  Visited With Patient and family together;Health care provider  Visit Type Spiritual support;Social support;Death;Patient actively dying  Referral From Nurse  Spiritual Encounters  Spiritual Needs Prayer;Emotional;Grief support  Stress Factors  Patient Stress Factors Exhausted;Family relationships;Health changes;Major life changes  Family Stress Factors Family relationships;Loss;Loss of control;Major life changes   Chaplain responded to a code on 3MW.  Pt was in a code and family was present.  Chaplain noticed that the family was distraught and grieving loudly.  Chaplain escorted sister and her husband to waiting room while daughter stayed in the room with pt.  Pt's son, Shon HaleLeon, was already in the waiting room.  Physician had indicated to Chaplain that this pt had multiple codes during the day and that the family could not make a decision on how to proceed.  Chaplain went to the waiting room to visit with pt's son to assess his needs.  He expressed that he wanted everything done for his mother.  Chaplain prayed with family in the waiting area and offered ministry of presence. Chaplain returned to pt room and she coded again.  Physician informed daughter that a decision had to be made about continuing CPR on mother.  Daughter was reluctant to make final decision.  Son finally returned to pt's room and told healthcare staff to stop resuscitation.  Chaplain remained in the pt's room while family gathered to visit pt.  Chaplain prayed with family again. Family did not know about funeral arrangements so Chaplain gave them the patient placement card to contact someone.    Rosezella FloridaLisa M Elwin Tsou  05/02/2016  2:24 AM  [phone]

## 2016-05-03 NOTE — Discharge Summary (Signed)
Physician Discharge Summary  Laura Levy NWG:[id number] DOB: May 04, 1941 DOA: 2016-05-03  PCP: Laura Baseman, MD  Admit date: 03-May-2016 Discharge date: 04/19/2016  Time spent: 20 minutes    Discharge Diagnoses:  Active Problems:   Weakness   CKD (chronic kidney disease), stage III   Dementia   Essential hypertension   Hyperlipidemia   Thrombocytopenia (HCC)   CVA (cerebral infarction)   Cerebellar stroke (HCC)   Acute respiratory failure (HCC)   TTP (thrombotic thrombocytopenic purpura) (HCC)   Acute embolic stroke (HCC)   Seizures (HCC)   Acute respiratory failure with hypoxemia [facility])   Discharge Condition: expired  Filed Weights   04/12/16 0415 04/13/16 0456 04/19/2016 0200  Weight: 70.5 kg (155 lb 6.8 oz) 70.1 kg (154 lb 8.7 oz) 70.1 kg (154 lb 8.7 oz)    History of present illness:  Per Dr. Olean Levy: Laura Levy is a 75 y.o. female with medical history significant of hypertension, prior strokes, dementia, presented to [facility] emergency room earlier today with chief complaint of altered mental status. Apparently she was normal up until the day prior, and had an episode of emesis after eating, and at that time family realized that her balance was off and she has slurred speech. She progressively became more altered and was brought to the emergency room. On my evaluation, patient is unable to talk to me, she seems to be trying to respond to commands however is nonverbal and does not follow many commands. The son and the daughter at bedside, and said she was normal up until last night when she became progressively altered.  Hospital Course:  For full details of the hospitalization please refer to daily notes in the medical record.  Briefly, Laura Levy is a 57F admitted with altered mental status who was found to have multiple infarcts (cerebellum, pons), and right vertebral artery occlusion. Initial labs also revealed profound thrombocytopenia and mild troponin leak. During her  hospitalization she developed seizures and required ICU level care. She required intubation for airway protection and ultimately underwent tracheostomy on 6/8. She was treated with plasmapheresis out of concern for TTP. Shortly after being transferred to a stepdown unit, she vomited and had an aspiration event that led to cardiac arrest. She was transferred back to the ICU. Despite aggressive efforts at resuscitation, she was coded 5 times before her family requested that resuscitative efforts be stopped. She expired on 04/19/2016 at 0142AM.  Procedures:  Tracheostomy   Plasmapheresis  TTE  Carotid dopplers  Lower extremity dopplers  Consultations:  Neurology  Cardiology  Discharge Exam: Filed Vitals:   04/29/2016 0140 04/28/2016 0145  BP: 152/120 159/43  Pulse:  151  Temp:    Resp: 19 31    General: unresponsive Cardiovascular: pulseless, no heart tones Respiratory: no breath sounds, no spontaneous effort   The results of significant diagnostics from this hospitalization (including imaging, microbiology, ancillary and laboratory) are listed below for reference.    Significant Diagnostic Studies: Ct Head Wo Contrast  May 03, 2016  CLINICAL DATA:  Vomiting beginning last night which is persistent. Weakness. History of old strokes. EXAM: CT HEAD WITHOUT CONTRAST TECHNIQUE: Contiguous axial images were obtained from the base of the skull through the vertex without intravenous contrast. COMPARISON:  01/01/2016.  02/16/2016. FINDINGS: New infarctions are seen within the inferior cerebellum, left more than right. There is mild swelling. No hemorrhage. Old cerebellar infarctions are present. Old pontine infarction. Old right occipital infarction. Old left frontoparietal infarction. Old right posterior frontal infarction. Chronic  small-vessel disease within the deep white matter. No mass lesion, hydrocephalus or extra-axial collection. No calvarial abnormality. No fluid in the sinuses.  IMPRESSION: Extensive old ischemic changes throughout the brain. New/subacute infarction at the inferior cerebellum left more than right. No mass effect or hemorrhage. Electronically Signed   By: Paulina Fusi M.D.   On: 10-Apr-2016 09:30   Mr [name] Head Wo Contrast  04/05/2016  CLINICAL DATA:  Altered mental status. Abnormal balance and slurred speech with associated vomiting. Symptoms progressed prior to admission. Abnormal CT scan. Study was ordered with and without contrast. Patient became hypotensive during this study in the study was therefore terminated. EXAM: MRI HEAD WITHOUT CONTRAST MRA HEAD WITHOUT CONTRAST MRA NECK WITHOUT CONTRAST TECHNIQUE: Multiplanar, multiecho pulse sequences of the brain and surrounding structures were obtained without intravenous contrast. Angiographic images of the Levy of Willis were obtained using MRA technique without intravenous contrast. Angiographic images of the neck were obtained using MRA technique without intravenous contrast. Carotid stenosis measurements (when applicable) are obtained utilizing NASCET criteria, using the distal internal carotid diameter as the denominator. COMPARISON:  CT head without contrast 04/05/2015. MRI brain 02/16/2016. FINDINGS: MRI HEAD FINDINGS The diffusion-weighted images confirm that the inferior left cerebellar infarct is acute. Extensive T2 signal changes are associated with this new infarction. There is no associated hemorrhage. Two additional areas of acute nonhemorrhagic infarction are present within the central pons. There is some involvement of the left inferior vermis. Extensive punctate cortical acute nonhemorrhagic infarcts are present throughout the right hemisphere in what appears to be a watershed distribution. Multiple areas are present on the left is well, but less extensive. A focal area of cortical acute nonhemorrhagic infarction is noted along the left precentral gyrus. This area may affect the lower extremity. In  addition of the areas of acute infarction, there are multiple remote infarcts of the cerebellum bilaterally and a right paramedian pontine infarct. Remote lacunar infarcts are present within the thalami bilaterally. Extensive white matter changes are present within the left external and posterior internal capsule. Encephalomalacia is present in the frontal lobes bilaterally with diffuse associated white matter disease. The internal auditory canals are within normal limits bilaterally. Flow is occluded in the right vertebral artery. The left vertebral artery and basilar artery are patent. The internal carotid arteries are patent bilaterally. Bilateral lens extractions are noted. The paranasal sinuses are clear. Fluid is present in the mastoid air cells bilaterally. No obstructing nasopharyngeal lesion is evident. MRA HEAD FINDINGS Mild tortuosity is present within the right cervical internal carotid artery. The internal carotid arteries are otherwise within normal limits through the ICA termini bilaterally. There is a fenestration of the distal left A1 segment, a normal variant. No definite anterior communicating artery is present. The MCA bifurcations are intact bilaterally. There is moderate attenuation of distal MCA branch vessels bilaterally, worse on the right. ACA branch vessels are intact. 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 The left PICA origin is visualized and normal. The left vertebral artery is within normal limits. There is moderate stenosis of the mid basilar artery the posterior cerebral arteries are of fetal type bilaterally. There is significant attenuation of distal PCA branch vessels, left greater than right. MRA NECK FINDINGS Time-of-flight imaging through the neck demonstrates no significant flow disturbance at either carotid bifurcation. Flow is antegrade in the vertebral arteries bilaterally. The left vertebral artery is dominant. IMPRESSION: 1. Large confluent acute nonhemorrhagic infarct  within the left inferior cerebellum. 2. Two small acute nonhemorrhagic infarcts within  the pons. 3. Multi focal punctate areas of acute nonhemorrhagic infarction over the convexities bilaterally, more prominent on the right. This appears to be in a watershed distribution. It embolic infarcts are considered less likely. 4. More focal acute nonhemorrhagic infarct involving the left precentral gyrus measuring 15 mm along the primary motor cortex. 5. Multiple remote lacunar infarcts of the cerebellum bilaterally. 6. Extensive areas of encephalomalacia involving the frontal lobes bilaterally. 7. 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 8. Mild moderate narrowing in the mid basilar artery. 9. Fetal type posterior cerebral arteries bilaterally. 10. The proximal anterior circulation is intact. 11. Moderate distal small vessel disease in the MCA branch vessels bilaterally, worse on the right. The pattern suggests the possibility of a vasculitis. 12. No significant flow disturbance at either carotid bifurcation. 13. Flow is antegrade in the vertebral arteries bilaterally. The left vertebral artery is dominant. Electronically Signed   By: Marin Roberts M.D.   On: 04/05/2016 17:30   Mr Angiogram Neck Wo Contrast  04/05/2016  CLINICAL DATA:  Altered mental status. Abnormal balance and slurred speech with associated vomiting. Symptoms progressed prior to admission. Abnormal CT scan. Study was ordered with and without contrast. Patient became hypotensive during this study in the study was therefore terminated. EXAM: MRI HEAD WITHOUT CONTRAST MRA HEAD WITHOUT CONTRAST MRA NECK WITHOUT CONTRAST TECHNIQUE: Multiplanar, multiecho pulse sequences of the brain and surrounding structures were obtained without intravenous contrast. Angiographic images of the Levy of Willis were obtained using MRA technique without intravenous contrast. Angiographic images of the neck were obtained using MRA technique without intravenous  contrast. Carotid stenosis measurements (when applicable) are obtained utilizing NASCET criteria, using the distal internal carotid diameter as the denominator. COMPARISON:  CT head without contrast 04/05/2015. MRI brain 02/16/2016. FINDINGS: MRI HEAD FINDINGS The diffusion-weighted images confirm that the inferior left cerebellar infarct is acute. Extensive T2 signal changes are associated with this new infarction. There is no associated hemorrhage. Two additional areas of acute nonhemorrhagic infarction are present within the central pons. There is some involvement of the left inferior vermis. Extensive punctate cortical acute nonhemorrhagic infarcts are present throughout the right hemisphere in what appears to be a watershed distribution. Multiple areas are present on the left is well, but less extensive. A focal area of cortical acute nonhemorrhagic infarction is noted along the left precentral gyrus. This area may affect the lower extremity. In addition of the areas of acute infarction, there are multiple remote infarcts of the cerebellum bilaterally and a right paramedian pontine infarct. Remote lacunar infarcts are present within the thalami bilaterally. Extensive white matter changes are present within the left external and posterior internal capsule. Encephalomalacia is present in the frontal lobes bilaterally with diffuse associated white matter disease. The internal auditory canals are within normal limits bilaterally. Flow is occluded in the right vertebral artery. The left vertebral artery and basilar artery are patent. The internal carotid arteries are patent bilaterally. Bilateral lens extractions are noted. The paranasal sinuses are clear. Fluid is present in the mastoid air cells bilaterally. No obstructing nasopharyngeal lesion is evident. MRA HEAD FINDINGS Mild tortuosity is present within the right cervical internal carotid artery. The internal carotid arteries are otherwise within normal limits  through the ICA termini bilaterally. There is a fenestration of the distal left A1 segment, a normal variant. No definite anterior communicating artery is present. The MCA bifurcations are intact bilaterally. There is moderate attenuation of distal MCA branch vessels bilaterally, worse on the right. ACA branch vessels  are intact. 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 The left PICA origin is visualized and normal. The left vertebral artery is within normal limits. There is moderate stenosis of the mid basilar artery the posterior cerebral arteries are of fetal type bilaterally. There is significant attenuation of distal PCA branch vessels, left greater than right. MRA NECK FINDINGS Time-of-flight imaging through the neck demonstrates no significant flow disturbance at either carotid bifurcation. Flow is antegrade in the vertebral arteries bilaterally. The left vertebral artery is dominant. IMPRESSION: 1. 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 2. Two small acute nonhemorrhagic infarcts within the pons. 3. Multi focal punctate areas of acute nonhemorrhagic infarction over the convexities bilaterally, more prominent on the right. This appears to be in a watershed distribution. It embolic infarcts are considered less likely. 4. More focal acute nonhemorrhagic infarct involving the left precentral gyrus measuring 15 mm along the primary motor cortex. 5. Multiple remote lacunar infarcts of the cerebellum bilaterally. 6. Extensive areas of encephalomalacia involving the frontal lobes bilaterally. 7. 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 8. Mild moderate narrowing in the mid basilar artery. 9. Fetal type posterior cerebral arteries bilaterally. 10. The proximal anterior circulation is intact. 11. Moderate distal small vessel disease in the MCA branch vessels bilaterally, worse on the right. The pattern suggests the possibility of a vasculitis. 12. No significant flow disturbance  at either carotid bifurcation. 13. Flow is antegrade in the vertebral arteries bilaterally. The left vertebral artery is dominant. Electronically Signed   By: Marin Roberts M.D.   On: 04/05/2016 17:30   Mr Brain Wo Contrast  04/05/2016  CLINICAL DATA:  Altered mental status. Abnormal balance and slurred speech with associated vomiting. Symptoms progressed prior to admission. Abnormal CT scan. Study was ordered with and without contrast. Patient became hypotensive during this study in the study was therefore terminated. EXAM: MRI HEAD WITHOUT CONTRAST MRA HEAD WITHOUT CONTRAST MRA NECK WITHOUT CONTRAST TECHNIQUE: Multiplanar, multiecho pulse sequences of the brain and surrounding structures were obtained without intravenous contrast. Angiographic images of the Levy of Willis were obtained using MRA technique without intravenous contrast. Angiographic images of the neck were obtained using MRA technique without intravenous contrast. Carotid stenosis measurements (when applicable) are obtained utilizing NASCET criteria, using the distal internal carotid diameter as the denominator. COMPARISON:  CT head without contrast 04/05/2015. MRI brain 02/16/2016. FINDINGS: MRI HEAD FINDINGS The diffusion-weighted images confirm that the inferior left cerebellar infarct is acute. Extensive T2 signal changes are associated with this new infarction. There is no associated hemorrhage. Two additional areas of acute nonhemorrhagic infarction are present within the central pons. There is some involvement of the left inferior vermis. Extensive punctate cortical acute nonhemorrhagic infarcts are present throughout the right hemisphere in what appears to be a watershed distribution. Multiple areas are present on the left is well, but less extensive. A focal area of cortical acute nonhemorrhagic infarction is noted along the left precentral gyrus. This area may affect the lower extremity. In addition of the areas of acute  infarction, there are multiple remote infarcts of the cerebellum bilaterally and a right paramedian pontine infarct. Remote lacunar infarcts are present within the thalami bilaterally. Extensive white matter changes are present within the left external and posterior internal capsule. Encephalomalacia is present in the frontal lobes bilaterally with diffuse associated white matter disease. The internal auditory canals are within normal limits bilaterally. Flow is occluded in the right vertebral artery. The left vertebral artery and basilar artery are  patent. The internal carotid arteries are patent bilaterally. Bilateral lens extractions are noted. The paranasal sinuses are clear. Fluid is present in the mastoid air cells bilaterally. No obstructing nasopharyngeal lesion is evident. MRA HEAD FINDINGS Mild tortuosity is present within the right cervical internal carotid artery. The internal carotid arteries are otherwise within normal limits through the ICA termini bilaterally. There is a fenestration of the distal left A1 segment, a normal variant. No definite anterior communicating artery is present. The MCA bifurcations are intact bilaterally. There is moderate attenuation of distal MCA branch vessels bilaterally, worse on the right. ACA branch vessels are intact. 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 The left PICA origin is visualized and normal. The left vertebral artery is within normal limits. There is moderate stenosis of the mid basilar artery the posterior cerebral arteries are of fetal type bilaterally. There is significant attenuation of distal PCA branch vessels, left greater than right. MRA NECK FINDINGS Time-of-flight imaging through the neck demonstrates no significant flow disturbance at either carotid bifurcation. Flow is antegrade in the vertebral arteries bilaterally. The left vertebral artery is dominant. IMPRESSION: 1. Large confluent acute nonhemorrhagic infarct within the left inferior  cerebellum. 2. Two small acute nonhemorrhagic infarcts within the pons. 3. Multi focal punctate areas of acute nonhemorrhagic infarction over the convexities bilaterally, more prominent on the right. This appears to be in a watershed distribution. It embolic infarcts are considered less likely. 4. More focal acute nonhemorrhagic infarct involving the left precentral gyrus measuring 15 mm along the primary motor cortex. 5. Multiple remote lacunar infarcts of the cerebellum bilaterally. 6. Extensive areas of encephalomalacia involving the frontal lobes bilaterally. 7. 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 8. Mild moderate narrowing in the mid basilar artery. 9. Fetal type posterior cerebral arteries bilaterally. 10. The proximal anterior circulation is intact. 11. Moderate distal small vessel disease in the MCA branch vessels bilaterally, worse on the right. The pattern suggests the possibility of a vasculitis. 12. No significant flow disturbance at either carotid bifurcation. 13. Flow is antegrade in the vertebral arteries bilaterally. The left vertebral artery is dominant. Electronically Signed   By: Marin Roberts M.D.   On: 04/05/2016 17:30   Ct Abdomen Pelvis W Contrast  26-Apr-2016  CLINICAL DATA:  Dementia and altered mental status. Vomiting beginning last night. EXAM: CT ABDOMEN AND PELVIS WITH CONTRAST TECHNIQUE: Multidetector CT imaging of the abdomen and pelvis was performed using the standard protocol following bolus administration of intravenous contrast. CONTRAST:  75 cc Isovue 370 COMPARISON:  01/01/2016 FINDINGS: The liver has a normal appearance. There are gallstones layering dependent within the gallbladder. No CT evidence of cholecystitis or obstruction. The spleen is normal. The pancreas is normal. The adrenal glands are normal. Small benign renal cysts bilaterally. No mass, stone or hydronephrosis. There is atherosclerosis of the aorta but no aneurysm. Bilateral renal artery stenosis is  present. No retroperitoneal mass or adenopathy. No free intraperitoneal fluid or air. Uterus and adnexal regions appear unremarkable. No primary bowel pathology. Chronic degenerative changes affect the spine, with 4 mm of anterolisthesis at L4-5 because of degenerative facet disease. IMPRESSION: No acute abdominal or pelvic finding. Atherosclerosis of the aorta and its branch vessels. Cholelithiasis without CT evidence of cholecystitis or obstruction. Electronically Signed   By: Paulina Fusi M.D.   On: 04/26/16 11:03   Dg Chest Port 1 View  04/25/2016  CLINICAL DATA:  Status post cardiac arrest and CPR. Initial encounter. EXAM: PORTABLE CHEST 1 VIEW COMPARISON:  Chest  radiograph performed 04/11/2011 FINDINGS: The patient's tracheostomy tube is seen ending 3-4 cm above the carina. A right IJ line is noted ending about the proximal to mid SVC. New bilateral airspace opacification raises concern for pulmonary edema, though pneumonia could have a similar appearance. No pleural effusion or pneumothorax is seen. The cardiomediastinal silhouette is mildly enlarged. No acute osseous abnormalities are seen. External pacing pads are noted. IMPRESSION: 1. Tracheostomy tube seen ending 3-4 cm above the carina. 2. Right IJ line noted ending about the proximal to mid SVC. 3. New bilateral airspace opacification raises concern for pulmonary edema, though pneumonia could have a similar appearance. 4. Mild cardiomegaly. Electronically Signed   By: Roanna Raider M.D.   On: 04/29/2016 01:38   Dg Chest Port 1 View  04/10/2016  CLINICAL DATA:  Tracheostomy placement.  Shortness of breath EXAM: PORTABLE CHEST 1 VIEW COMPARISON:  April 07, 2016 FINDINGS: Tracheostomy catheter present with tip 4.2 cm above the carina. Central catheter tip is in the superior vena cava. No pneumothorax. No edema or consolidation. Heart is upper normal in size with pulmonary vascularity within normal limits. No adenopathy. No bone lesions. IMPRESSION:  Tube and catheter positions as described without pneumothorax. No edema or consolidation. Electronically Signed   By: Bretta Bang III M.D.   On: 04/10/2016 11:21   Dg Chest Port 1 View  04/07/2016  CLINICAL DATA:  Respiratory failure EXAM: PORTABLE CHEST 1 VIEW COMPARISON:  04/06/2016 FINDINGS: Tubular device is stable. Low volumes. Bibasilar atelectasis. Small right pleural effusion is suspected. The left pleural effusion suspected previously is not appreciated on today's study. Vascular congestion without interstitial edema. IMPRESSION: Bibasilar atelectasis and vascular congestion are not significantly changed. Small right pleural effusion. Electronically Signed   By: Jolaine Click M.D.   On: 04/07/2016 07:36   Dg Chest Port 1 View  04/06/2016  CLINICAL DATA:  Respiratory failure EXAM: PORTABLE CHEST 1 VIEW COMPARISON:  04/04/2006 FINDINGS: Support devices are stable. Right lung is clear. Left base atelectasis with small left pleural effusion. No acute bony abnormality. IMPRESSION: Left basilar atelectasis with small left effusion. Electronically Signed   By: Charlett Nose M.D.   On: 04/06/2016 07:27   Dg Chest Port 1 View  Apr 11, 2016  CLINICAL DATA:  Central line placement.  Initial encounter. EXAM: PORTABLE CHEST 1 VIEW COMPARISON:  Chest radiograph performed earlier today at 8:56 p.m. FINDINGS: The patient's endotracheal tube is seen ending 1-2 cm above the carina. This could be retracted 1-2 cm. The right IJ line is noted ending about the mid SVC. The lungs are hypoexpanded. Vascular congestion is noted. Increased interstitial markings may reflect mild interstitial edema. No pleural effusion or pneumothorax is seen. The cardiomediastinal silhouette is borderline normal in size. No acute osseous abnormalities are identified. IMPRESSION: 1. Endotracheal tube seen ending 1-2 cm above the carina. This could be retracted 1-2 cm. 2. Right IJ line noted ending about the mid SVC. 3. Lungs hypoexpanded.  Vascular congestion noted. Increased interstitial markings may reflect mild interstitial edema. Electronically Signed   By: Roanna Raider M.D.   On: April 11, 2016 23:03   Dg Chest Port 1 View  April 11, 2016  CLINICAL DATA:  Acute respiratory failure EXAM: PORTABLE CHEST 1 VIEW COMPARISON:  2016-04-11 FINDINGS: Endotracheal tube is 17 mm above the carina. NG tube enters the stomach. Heart is normal size. No confluent airspace opacities or effusions. No acute bony abnormality. IMPRESSION: Endotracheal tube 17 mm above the carina. No acute findings. Electronically Signed  By: Charlett Nose M.D.   On: 04-21-16 21:09   Dg Chest Portable 1 View  04-21-2016  CLINICAL DATA:  Altered mental status and vomiting for 1 day, former smoker EXAM: PORTABLE CHEST 1 VIEW COMPARISON:  01/01/2016 FINDINGS: Limited inspiratory effect. Heart size upper normal. Vascular pattern normal. Mild diffuse interstitial prominence stable. IMPRESSION: Chronic interstitial disease.  No acute findings. Electronically Signed   By: Esperanza Heir M.D.   On: 04-21-2016 09:02   Dg Abd Portable 1v  04/12/2016  CLINICAL DATA:  stat for NG tube placement EXAM: PORTABLE ABDOMEN - 1 VIEW COMPARISON:  04/12/2016 FINDINGS: NG tube over the stomach. Decreased gastric distention. Numerous loops of mildly prominent small bowel again identified but there is gas into the distal colon as well. IMPRESSION: NG tube projects over the stomach. Several loops of mildly distended central small bowel noted, but gas is also seen into the distal colon. Correlate clinically. Electronically Signed   By: Esperanza Heir M.D.   On: 04/12/2016 20:40   Dg Abd Portable 1v  04/12/2016  CLINICAL DATA:  75 year old female with enteric tube placement. EXAM: PORTABLE ABDOMEN - 1 VIEW COMPARISON:  Radiograph dated 06/08 7 FINDINGS: An enteric tube is noted with tip over the gastric bubble in the proximal stomach. There is moderate air distention of the stomach, new from prior  study. Gastric outlet obstruction is not excluded. Clinical correlation is recommended. Moderate stool throughout the colon. There is no evidence of small-bowel obstruction. No free air noted. No radiopaque calculi. There is bibasilar atelectatic changes of the lungs. There is degenerative changes of the spine. No acute fracture. IMPRESSION: Enteric tube within the stomach. Moderate air distention of the stomach progressed compared to the prior study. Clinical correlation is recommended. Electronically Signed   By: Elgie Collard M.D.   On: 04/12/2016 01:42   Dg Abd Portable 1v  04/10/2016  CLINICAL DATA:  Feeding tube placement EXAM: PORTABLE ABDOMEN - 1 VIEW COMPARISON:  04/07/2016 FINDINGS: Feeding tube with the metallic tip projecting over antrum of the stomach. There is a moderate amount of stool in the ascending colon. There is no bowel dilatation to suggest obstruction. There is no evidence of pneumoperitoneum, portal venous gas or pneumatosis. There are no pathologic calcifications along the expected course of the ureters. The osseous structures are unremarkable. IMPRESSION: Feeding tube with the metallic tip projecting over antrum of the stomach. Electronically Signed   By: Elige Ko   On: 04/10/2016 13:45   Dg Abd Portable 1v  04/07/2016  CLINICAL DATA:  OG tube placement today. EXAM: PORTABLE ABDOMEN - 1 VIEW COMPARISON:  None. FINDINGS: OG tube is in place with the tip in the distal stomach. Bowel gas pattern is unremarkable. IMPRESSION: OG tube in good position. Electronically Signed   By: Drusilla Kanner M.D.   On: 04/07/2016 10:12   Ct Angio Chest Aorta W/cm &/or Wo/cm  Apr 21, 2016  CLINICAL DATA:  Dementia and altered mental status. Vomiting beginning last night. EXAM: CT ANGIOGRAPHY CHEST WITH CONTRAST TECHNIQUE: Multidetector CT imaging of the chest was performed using the standard protocol during bolus administration of intravenous contrast. Multiplanar CT image reconstructions and MIPs  were obtained to evaluate the vascular anatomy. CONTRAST:  75 cc Isovue 370 COMPARISON:  Radiography same day. FINDINGS: Arterial opacification is excellent. There is atherosclerosis of the aorta but no aneurysm or dissection. Calcification of the brachiocephalic vessel origins but without evidence of flow-limiting stenosis. Pulmonary arteries appear normal without emboli. Coronary artery calcification  is present. No pleural or pericardial fluid. There is pulmonary scarring with emphysema. No infiltrate or collapse. No mediastinal mass or lymphadenopathy. Scans in the upper abdomen show gallstones dependent within the gallbladder but no active process. Atherosclerosis of the upper abdominal aorta and its branch vessels without aneurysm. Bilateral renal artery stenosis. Review of the MIP images confirms the above findings. IMPRESSION: No dissection. No pulmonary emboli. Atherosclerosis of the aorta and its branch vessels. Coronary artery calcifications. Bilateral renal artery stenoses. Emphysema and pulmonary scarring. No active chest pathology otherwise. Electronically Signed   By: Paulina FusiMark  Shogry M.D.   On: 01-May-2016 10:59    Microbiology: No results found for this or any previous visit (from the past 240 hour(s)).   Labs: Basic Metabolic Panel:  Recent Labs Lab 04/13/16 0500 04/13/16 2340  NA 138 >180*  K 3.1* 4.6  CL 103 85*  CO2 28 >50*  GLUCOSE 131* 190*  BUN 24* 20  CREATININE 1.14* 1.21*  CALCIUM 8.8* 6.3*  MG 1.8  --   PHOS 1.8*  --    Liver Function Tests:  Recent Labs Lab 04/13/16 2340  AST 211*  ALT 118*  ALKPHOS 94  BILITOT 1.9*  PROT 4.1*  ALBUMIN 1.9*   No results for input(s): LIPASE, AMYLASE in the last 168 hours. No results for input(s): AMMONIA in the last 168 hours. CBC:  Recent Labs Lab 04/13/16 0500 04/13/16 0830 04/13/16 2340  WBC 14.1*  --  20.9*  NEUTROABS  --   --  15.7*  HGB 7.3*  --  5.1*  HCT 22.4*  --  15.5*  MCV 88.2  --  87.6  PLT  --   15* 7*  9*   Cardiac Enzymes:  Recent Labs Lab 04/13/16 2340  TROPONINI 2.79*   BNP: BNP (last 3 results)  Recent Labs  01/01/16 1050  BNP 166.0*    ProBNP (last 3 results) No results for input(s): PROBNP in the last 8760 hours.  CBG:  Recent Labs Lab 04/13/16 0410 04/13/16 0744 04/13/16 1127 04/13/16 1610 04/13/16 1948  GLUCAP 133* 118* 129* 110* 123*    Signed:  Nita SickleSarah Ellen E. Stephens, MD Pulmonary and Critical Care 04/19/2016 11:42 PM

## 2016-05-03 DEATH — deceased

## 2018-04-24 IMAGING — DX DG ABD PORTABLE 1V
1 series · 1 of 1 positions shown · non-contrast
Comparison: 04/07/2016

CLINICAL DATA: Feeding tube placement

EXAM:
PORTABLE ABDOMEN - 1 VIEW

[abdomen supine]
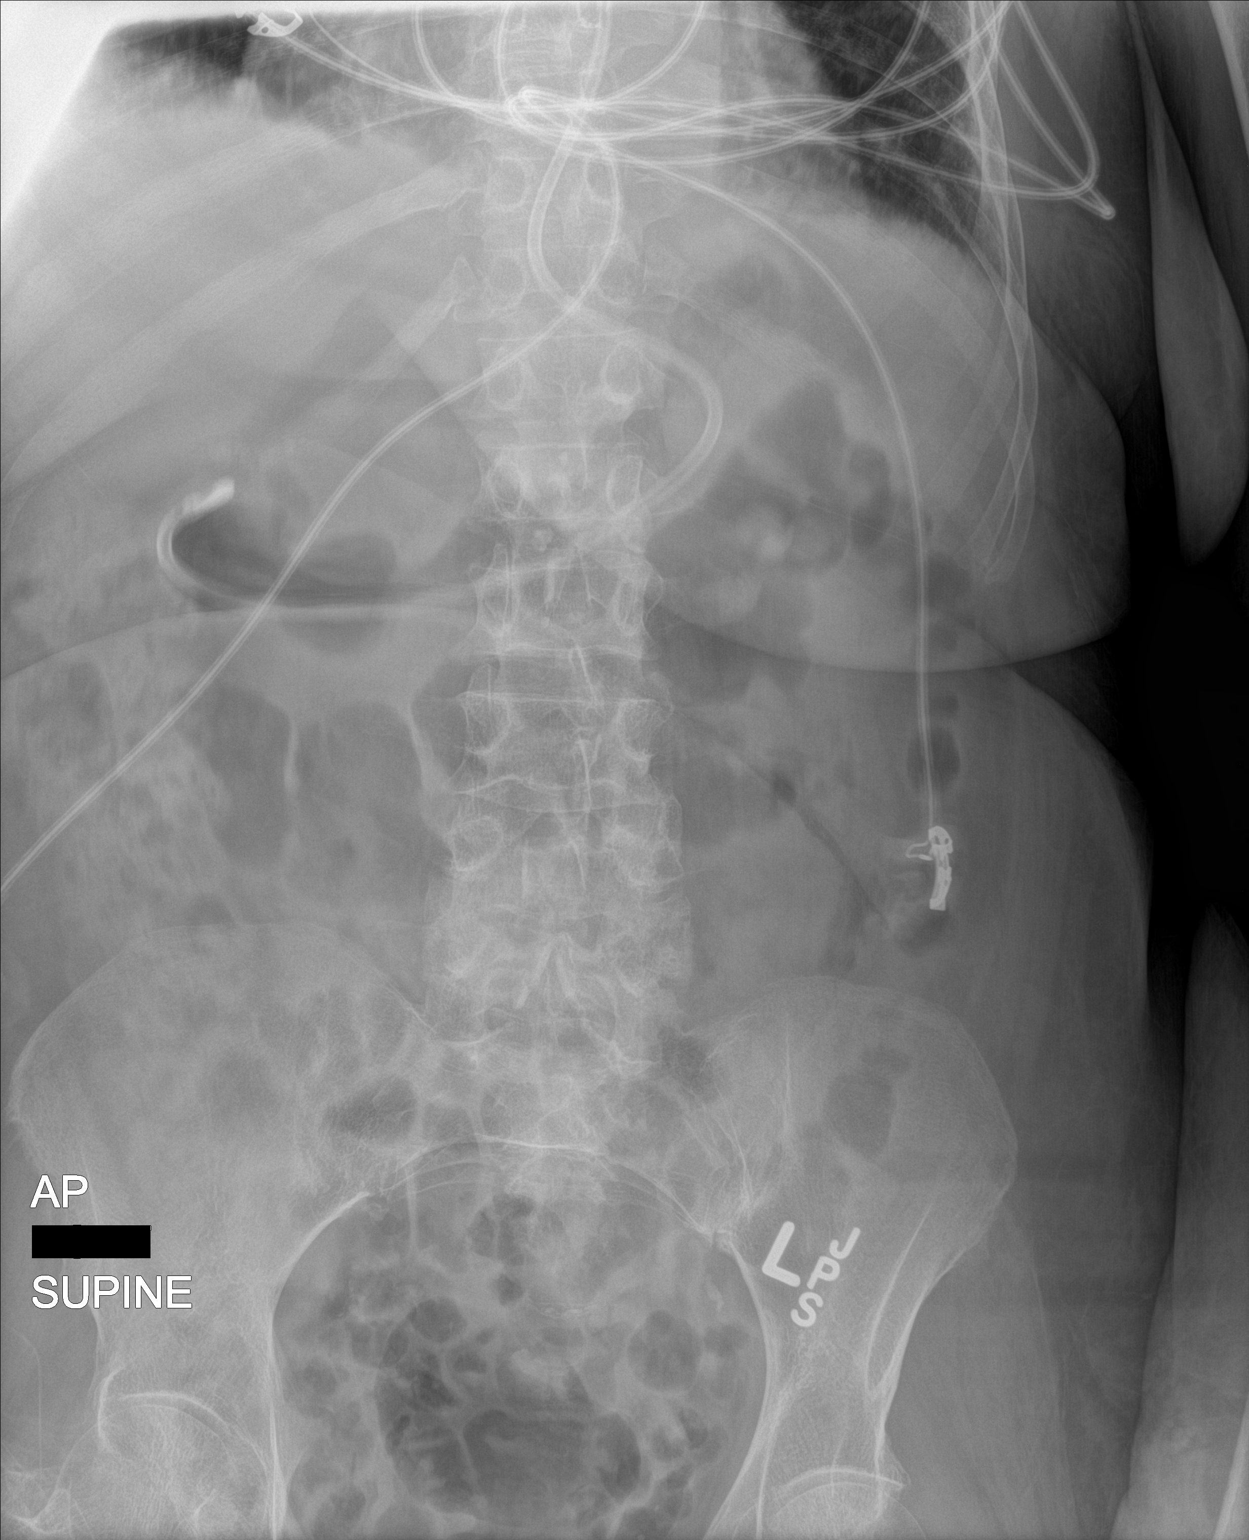

[1 of 1 positions shown; findings below may reference images not displayed]

FINDINGS: Feeding tube with the metallic tip projecting over antrum of the
stomach. There is a moderate amount of stool in the ascending colon.
There is no bowel dilatation to suggest obstruction. There is no
evidence of pneumoperitoneum, portal venous gas or pneumatosis.
There are no pathologic calcifications along the expected course of
the ureters. The osseous structures are unremarkable.
IMPRESSION: Feeding tube with the metallic tip projecting over antrum of the
stomach.

## 2018-04-26 IMAGING — DX DG ABD PORTABLE 1V
1 series · 1 of 1 positions shown · non-contrast
Comparison: 04/12/2016

CLINICAL DATA: stat for NG tube placement

EXAM:
PORTABLE ABDOMEN - 1 VIEW

[abdomen supine]
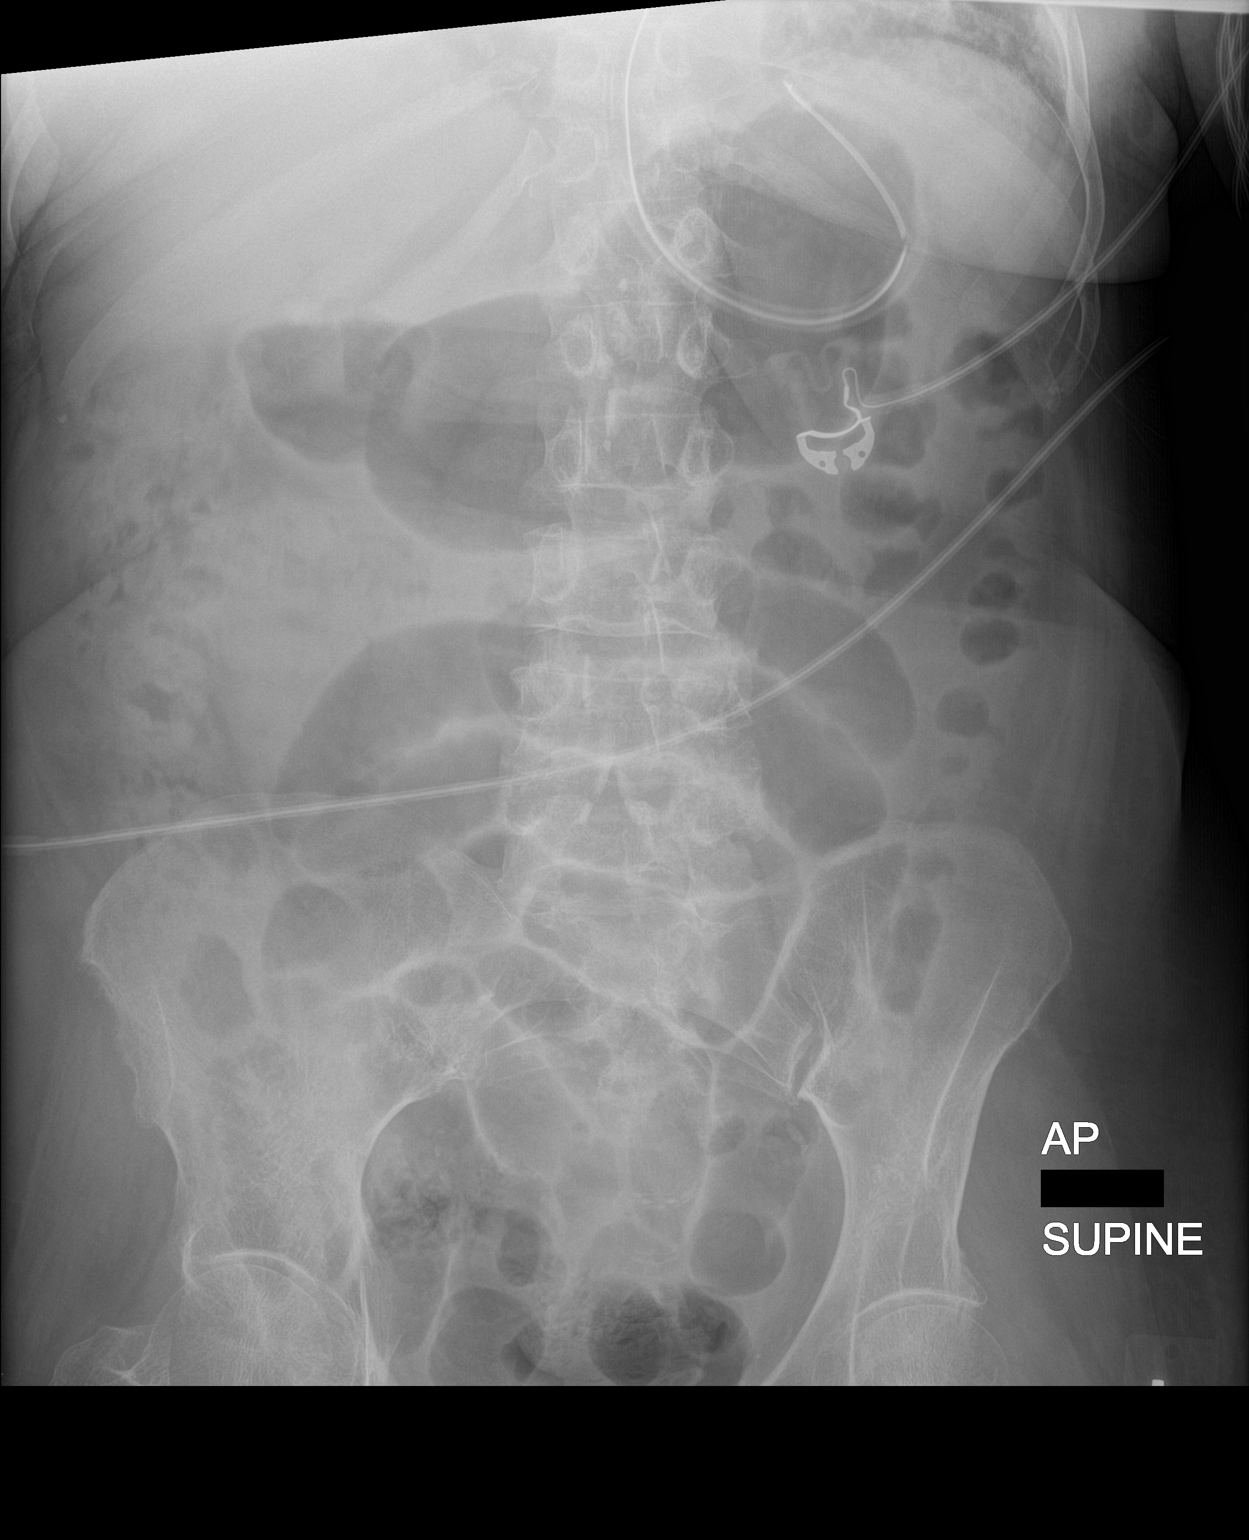

[1 of 1 positions shown; findings below may reference images not displayed]

FINDINGS: NG tube over the stomach. Decreased gastric distention. Numerous
loops of mildly prominent small bowel again identified but there is
gas into the distal colon as well.
IMPRESSION: NG tube projects over the stomach. Several loops of mildly distended
central small bowel noted, but gas is also seen into the distal
colon. Correlate clinically.
